# Patient Record
Sex: Female | Born: 1965
Health system: Southern US, Community
[De-identification: ages and names within clinical notes are randomized; demographics above are authoritative.]

## PROBLEM LIST (undated history)

## (undated) DIAGNOSIS — G43909 Migraine, unspecified, not intractable, without status migrainosus: Secondary | ICD-10-CM

## (undated) DIAGNOSIS — G4700 Insomnia, unspecified: Secondary | ICD-10-CM

## (undated) DIAGNOSIS — M1990 Unspecified osteoarthritis, unspecified site: Secondary | ICD-10-CM

## (undated) DIAGNOSIS — E039 Hypothyroidism, unspecified: Secondary | ICD-10-CM

## (undated) DIAGNOSIS — F329 Major depressive disorder, single episode, unspecified: Secondary | ICD-10-CM

## (undated) DIAGNOSIS — K602 Anal fissure, unspecified: Secondary | ICD-10-CM

## (undated) DIAGNOSIS — F419 Anxiety disorder, unspecified: Secondary | ICD-10-CM

## (undated) DIAGNOSIS — E079 Disorder of thyroid, unspecified: Secondary | ICD-10-CM

## (undated) DIAGNOSIS — F32A Depression, unspecified: Secondary | ICD-10-CM

## (undated) DIAGNOSIS — M069 Rheumatoid arthritis, unspecified: Secondary | ICD-10-CM

## (undated) HISTORY — PX: OTHER SURGICAL HISTORY: SHX169

## (undated) HISTORY — DX: Hypothyroidism, unspecified: E03.9

## (undated) HISTORY — DX: Insomnia, unspecified: G47.00

## (undated) HISTORY — DX: Unspecified osteoarthritis, unspecified site: M19.90

## (undated) HISTORY — DX: Rheumatoid arthritis, unspecified: M06.9

## (undated) HISTORY — DX: Anxiety disorder, unspecified: F41.9

## (undated) HISTORY — PX: KNEE ARTHROPLASTY: SHX992

## (undated) HISTORY — DX: Anal fissure, unspecified: K60.2

## (undated) HISTORY — PX: HEMORRHOID SURGERY: SHX153

## (undated) HISTORY — PX: ABDOMINAL HYSTERECTOMY: SHX81

## (undated) HISTORY — PX: MOUTH SURGERY: SHX715

## (undated) HISTORY — PX: KNEE SURGERY: SHX244

---

## 2008-12-03 ENCOUNTER — Encounter: Admission: RE | Admit: 2008-12-03 | Discharge: 2008-12-03 | Payer: Self-pay | Admitting: Internal Medicine

## 2010-11-24 ENCOUNTER — Encounter: Payer: Self-pay | Admitting: Family Medicine

## 2010-11-24 ENCOUNTER — Inpatient Hospital Stay (INDEPENDENT_AMBULATORY_CARE_PROVIDER_SITE_OTHER)
Admission: RE | Admit: 2010-11-24 | Discharge: 2010-11-24 | Disposition: A | Discharge status: Home / Self Care | Payer: 59 | Source: Ambulatory Visit | Attending: Family Medicine | Admitting: Family Medicine

## 2010-11-24 DIAGNOSIS — M940 Chondrocostal junction syndrome [Tietze]: Secondary | ICD-10-CM

## 2010-11-24 DIAGNOSIS — J069 Acute upper respiratory infection, unspecified: Secondary | ICD-10-CM

## 2011-01-08 ENCOUNTER — Inpatient Hospital Stay (INDEPENDENT_AMBULATORY_CARE_PROVIDER_SITE_OTHER)
Admission: RE | Admit: 2011-01-08 | Discharge: 2011-01-08 | Disposition: A | Discharge status: Home / Self Care | Payer: 59 | Source: Ambulatory Visit | Attending: Emergency Medicine | Admitting: Emergency Medicine

## 2011-01-08 ENCOUNTER — Encounter: Payer: Self-pay | Admitting: Emergency Medicine

## 2011-01-08 DIAGNOSIS — F419 Anxiety disorder, unspecified: Secondary | ICD-10-CM | POA: Insufficient documentation

## 2011-01-08 DIAGNOSIS — M549 Dorsalgia, unspecified: Secondary | ICD-10-CM

## 2011-06-29 ENCOUNTER — Emergency Department
Admission: EM | Admit: 2011-06-29 | Discharge: 2011-06-29 | Disposition: A | Discharge status: Home / Self Care | Payer: 59 | Source: Home / Self Care | Attending: Emergency Medicine | Admitting: Emergency Medicine

## 2011-06-29 ENCOUNTER — Encounter: Payer: Self-pay | Admitting: *Deleted

## 2011-06-29 DIAGNOSIS — R05 Cough: Secondary | ICD-10-CM

## 2011-06-29 DIAGNOSIS — J069 Acute upper respiratory infection, unspecified: Secondary | ICD-10-CM

## 2011-06-29 MED ORDER — GUAIFENESIN-CODEINE 100-10 MG/5ML PO SYRP: 5.0000 mL | ORAL_SOLUTION | Freq: Four times a day (QID) | ORAL | Status: AC | PRN

## 2011-06-29 MED ORDER — AMOXICILLIN 875 MG PO TABS: 875.0000 mg | ORAL_TABLET | Freq: Two times a day (BID) | ORAL | Status: AC

## 2011-06-29 MED ORDER — PREDNISONE (PAK) 10 MG PO TABS: 10.0000 mg | ORAL_TABLET | Freq: Every day | ORAL | Status: AC

## 2011-06-29 NOTE — ED Provider Notes (Signed)
History     CSN: [id number] Arrival date & time: 06/29/2011  9:14 AM   First MD Initiated Contact with Patient 06/29/11 [phone]      Chief Complaint  Patient presents with  . Cough    (Consider location/radiation/quality/duration/timing/severity/associated sxs/prior treatment) HPI Courtney Leonard is a 45 y.o. female who complains of onset of cold symptoms for 10  days. 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 No sore throat + cough No pleuritic pain No wheezing + nasal congestion + post-nasal drainage + sinus pain/pressure No chest congestion No itchy/red eyes + earache No hemoptysis No SOB No chills/sweats No fever No nausea No vomiting No abdominal pain No diarrhea No skin rashes + fatigue No myalgias No headache    History reviewed. No pertinent past medical history.  Past Surgical History  Procedure Date  . Appendectomy     c-section  . Abdominal hysterectomy   . Laproscopy   . Knee surgery     RT knee    Family History  Problem Relation Age of Onset  . Diabetes Mother   . Hypertension Mother   . Atrial fibrillation Father   . Prostate cancer Father     History  Substance Use Topics  . Smoking status: Former Smoker    Quit date: 06/28/2001  . Smokeless tobacco: Not on file  . Alcohol Use: Yes     1 q wk    OB History    Grav Para Term Preterm Abortions TAB SAB Ect Mult Living                  Review of Systems  Allergies  Review of patient's allergies indicates no known allergies.  Home Medications  No current outpatient prescriptions on file.  BP 107/73  Pulse 68  Temp(Src) 98.1 F (36.7 C) (Oral)  Resp 16  Ht 5\' 4"  (1.626 m)  Wt 176 lb (79.833 kg)  BMI 30.21 kg/m2  SpO2 100%  Physical Exam  Nursing note and vitals reviewed. Constitutional: She is oriented to person, place, and time. She appears well-developed and well-nourished.  HENT:  Head: Normocephalic and atraumatic.  Right Ear: Tympanic membrane, external  ear and ear canal normal.  Left Ear: Tympanic membrane, external ear and ear canal normal.  Nose: Mucosal edema and rhinorrhea present.  Mouth/Throat: Posterior oropharyngeal erythema present. No oropharyngeal exudate or posterior oropharyngeal edema.  Neck: Neck supple.  Cardiovascular: Regular rhythm and normal heart sounds.   Pulmonary/Chest: Effort normal and breath sounds normal. No respiratory distress.  Neurological: She is alert and oriented to person, place, and time.  Skin: Skin is warm and dry.  Psychiatric: She has a normal mood and affect. Her speech is normal.    ED Course  Procedures (including critical care time)  Labs Reviewed - No data to display No results found.   No diagnosis found.    MDM   1)  Take the prescribed antibiotic as instructed.  Hold for a few days since this is likely viral. I first put her on cough medicine and prednisone for the next few days because I think that will work best. 2)  Use nasal saline solution (over the counter) at least 3 times a day. 3)  Use over the counter decongestants like Zyrtec-D every 12 hours as needed to help with congestion.  If you have hypertension, do not take medicines with sudafed.  4)  Can take tylenol every 6 hours or motrin every 8 hours for pain or  fever. 5)  Follow up with your primary doctor if no improvement in 5-7 days, sooner if increasing pain, fever, or new symptoms.       Lily Kocher, MD 06/29/11 [phone]

## 2011-06-29 NOTE — ED Notes (Signed)
Pt c/o dry cough, runny and stuffy nose, and hoarseness x 1 1/2 wk. No fever. She has taken dayquil, nyquil, and robt. with no relief.

## 2011-07-06 NOTE — Progress Notes (Signed)
Summary: UPPER BACK PAIN RIGHT SIDE   Vital Signs:  Patient Profile:   45 Years Old Female CC:      right scapula pain x yesterday Height:     64 inches Weight:      172 pounds O2 Sat:      99 % O2 treatment:    Room Air Temp:     97.9 degrees F oral Pulse rate:   68 / minute Resp:     16 per minute BP sitting:   103 / 68  (left arm) Cuff size:   regular  Pt. in pain?   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 Location: upper R back Timing: for 1 day Description: sharp cramping Modifying factors: ibu & tylenol didn't help much, heating pad helped a little Worse with: movement, lifting Better with: rest, massage Trauma: no Bladder/bowel incontinence: no Weakness: no Fever/chills: no Night pain: no Unexplained weight loss: no Cancer/immunosuppression: no PMH of osteoporosis or chronic steroid use:  no  REVIEW OF SYSTEMS Constitutional Symptoms      Denies fever, chills, night sweats, weight loss, weight gain, and fatigue.  Eyes       Denies change in vision, eye pain, eye discharge, glasses, contact lenses, and eye surgery. Ear/Nose/Throat/Mouth       Denies hearing loss/aids, change in hearing, ear pain, ear discharge, dizziness, frequent runny nose, frequent nose bleeds, sinus problems, sore throat, hoarseness, and tooth pain or bleeding.  Respiratory       Denies dry cough, productive cough, wheezing, shortness of breath, asthma, bronchitis, and emphysema/COPD.  Cardiovascular       Denies murmurs, chest pain, and tires easily with exhertion.     Gastrointestinal       Denies stomach pain, nausea/vomiting, diarrhea, constipation, blood in bowel movements, and indigestion. Genitourniary       Denies painful urination, kidney stones, and loss of urinary control. Neurological       Denies paralysis, seizures, and fainting/blackouts. Musculoskeletal       Complains of muscle pain and decreased range of motion.      Denies joint pain, joint stiffness, redness, swelling, muscle weakness, and gout.      Comments: right scapula Skin       Denies bruising, unusual mles/lumps or sores, and hair/skin or nail changes.  Psych       Denies mood changes, temper/anger issues, anxiety/stress, speech problems, depression, and sleep problems. Other Comments: Patient c/o right scapula pain x yesterday. No known cause of pain. Used heating pad, advil and ibuprofen for pain relief.    Past History:  Past Medical History:  Anxiety  Past Surgical History: Reviewed history from 11/24/2010 and no changes required. Caesarean section Hysterectomy  Family History: Reviewed history from 11/24/2010 and no changes required. 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  Shoulder FROM. Assessment New Problems: ANXIETY (ICD-300.00) BACK PAIN (ICD-724.5)   Plan New Medications/Changes: TRAMADOL HCL 50 MG TABS (TRAMADOL HCL) 1 by mouth q6-8 hrs as needed for pain  #16 x 0, 01/08/2011, Hoyt Koch MD PREDNISONE 20 MG TABS (PREDNISONE) 1 by mouth two times a day for 3  days  #6 x 0, 01/08/2011, Hoyt Koch MD FLEXERIL 10 MG TABS (CYCLOBENZAPRINE HCL) 1 by mouth three times a day as needed for spasms  #16 x 0, 01/08/2011, Hoyt Koch MD  New Orders: Est. Patient Level III [phone] Planning Comments:   Back spasm and pain, likely from working at home and work.  Avoid heavy lifting, pushing, pulling.  Massage, rest, and heating pad.  Use meds as directed.  If not improving by next week, can refer to PT +/- sports med.  F/u with PCP if worsening in the meantime.   The patient and/or caregiver has been counseled thoroughly with regard to medications prescribed including dosage, schedule, interactions, rationale for use, and possible side effects and they verbalize understanding.  Diagnoses and expected course of recovery discussed and will return if not improved as expected or if the condition worsens. Patient and/or caregiver verbalized understanding.  Prescriptions: TRAMADOL HCL 50 MG TABS (TRAMADOL HCL) 1 by mouth q6-8 hrs as needed for pain  #16 x 0   Entered and Authorized by:   Hoyt Koch MD   Signed by:   Hoyt Koch MD on 01/08/2011   Method used:   Print then Give to Patient   RxID:   [id number] PREDNISONE 20 MG TABS (PREDNISONE) 1 by mouth two times a day for 3 days  #6 x 0   Entered and Authorized by:   Hoyt Koch MD   Signed by:   Hoyt Koch MD on 01/08/2011   Method used:   Print then Give to Patient   RxID:   [id number] FLEXERIL 10 MG TABS (CYCLOBENZAPRINE HCL) 1 by mouth three times a day as needed for spasms  #16 x 0   Entered and Authorized by:   Hoyt Koch MD   Signed by:   Hoyt Koch MD on 01/08/2011   Method used:   Print then Give to Patient   RxID:   [id number]   Orders Added: 1)  Est. Patient Level III [47425]

## 2011-07-06 NOTE — Letter (Signed)
Summary: Out of Work  [facility]  [address]   [place], [place] [address]   Phone: [phone]  Fax: [phone]    November 24, 2010   Employee:  Courtney Leonard    To Whom It May Concern:   For Medical reasons, please excuse the above named employee from work 11/26/10.  If you need additional information, please feel free to contact our office.         Sincerely,    Donna Christen MD

## 2011-07-06 NOTE — Progress Notes (Signed)
Summary: Possible Sinus Infection and ear discomfort? Room 5   Vital Signs:  Patient Profile:   45 Years Old Female CC:      Headache, ears hurt, cough x 1 week Height:     64 inches Weight:      176 pounds O2 Sat:      99 % O2 treatment:    Room Air Temp:     98.5 degrees F oral Pulse rate:   84 / minute Pulse rhythm:   regular Resp:     16 per minute BP sitting:   115 / 77  (left arm) Cuff size:   regular  Vitals Entered By: Emilio Math (November 24, 2010 12:10 PM)                History of Present Illness Chief Complaint: Headache, ears hurt, cough x 1 week History of Present Illness:  Subjective: Patient complains onset of frontal headache about 1.5 weeks ago.  She has a history of seasonal allergies that usually peak earlier in the year.  About 2 to 3 days ago she developed a mild sore throat and stiff posterior neck. + cough, worse at night No pleuritic pain, but has pain over the sternum No wheezing + nasal congestion + post-nasal drainage ? sinus pain/pressure No itchy/red eyes + bilateral earache No hemoptysis No SOB + low grade fever/chills for about 2 days No nausea No vomiting No abdominal pain No diarrhea No skin rashes + fatigue + myalgias Used OTC meds without relief   Current Meds NYQUIL COLD & FLU 15-6.25-325 MG/15ML LIQD (DM-DOXYLAMINE-ACETAMINOPHEN)  AZITHROMYCIN 250 MG TABS (AZITHROMYCIN) Two tabs by mouth on day 1, then 1 tab daily on days 2 through 5 (Rx void after 12/02/10) BENZONATATE 200 MG CAPS (BENZONATATE) One by mouth hs as needed cough  REVIEW OF SYSTEMS Constitutional Symptoms      Denies fever, chills, night sweats, weight loss, weight gain, and fatigue.  Eyes       Denies change in vision, eye pain, eye discharge, glasses, contact lenses, and eye surgery. Ear/Nose/Throat/Mouth       Complains of ear pain and sinus problems.      Denies hearing loss/aids, change in hearing, ear discharge, dizziness, frequent runny nose,  frequent nose bleeds, sore throat, hoarseness, and tooth pain or bleeding.  Respiratory       Complains of dry cough.      Denies productive cough, wheezing, shortness of breath, asthma, bronchitis, and emphysema/COPD.  Cardiovascular       Denies murmurs, chest pain, and tires easily with exhertion.    Gastrointestinal       Denies stomach pain, nausea/vomiting, diarrhea, constipation, blood in bowel movements, and indigestion. Genitourniary       Denies painful urination, kidney stones, and loss of urinary control. Neurological       Complains of headaches.      Denies paralysis, seizures, and fainting/blackouts. Musculoskeletal       Denies muscle pain, joint pain, joint stiffness, decreased range of motion, redness, swelling, muscle weakness, and gout.  Skin       Denies bruising, unusual mles/lumps or sores, and hair/skin or nail changes.  Psych       Denies mood changes, temper/anger issues, anxiety/stress, speech problems, depression, and sleep problems.  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  Extraocular movement is intact.  Conjunctivae are not inflamed.  Ears:  Canals normal.  Tympanic membranes normal.   Nose:  Mildly congested turbinates.  No sinus tenderness  Pharynx:  Normal Neck:  Supple.  Tender shotty anterior/posterior nodes are palpated bilaterally.  Lungs:  Clear to auscultation.  Breath sounds are equal.  Chest:  Distinct tenderness to palpation over mid-sternum Heart:  Regular rate and rhythm without murmurs, rubs, or gallops.  Abdomen:  Nontender without masses or hepatosplenomegaly.  Bowel  sounds are present.  No CVA or flank tenderness.  Skin:  No rash Rapid strep test negative  Assessment New Problems: COSTOCHONDRITIS, ACUTE (ICD-733.6) UPPER RESPIRATORY INFECTION, ACUTE (ICD-465.9)   Plan New Medications/Changes: BENZONATATE 200 MG CAPS (BENZONATATE) One by mouth hs as needed cough  #12 x 0, 11/24/2010, Donna Christen MD AZITHROMYCIN 250 MG TABS (AZITHROMYCIN) Two tabs by mouth on day 1, then 1 tab daily on days 2 through 5 (Rx void after 12/02/10)  #6 tabs x 0, 11/24/2010, Donna Christen MD  New Orders: Rapid Strep [40981] Pulse Oximetry (single measurment) [19147] New Patient Level III [82956] Planning Comments:   Treat symptomatically for now:  Increase fluid intake, begin expectorant/decongestant, cough suppressant at bedtime.  If fever/chills/sweats persist, or if not improving 5 days begin Z-pack (given Rx to hold).  Take ibuprofen for sternum discomfort. Followup with PCP if not improving 7 to 10 days.   The patient and/or caregiver has been counseled thoroughly with regard to medications prescribed including dosage, schedule, interactions, rationale for use, and possible side effects and they verbalize understanding.  Diagnoses and expected course of recovery discussed and will return if not improved as expected or if the condition worsens. Patient and/or caregiver verbalized understanding.  Prescriptions: BENZONATATE 200 MG CAPS (BENZONATATE) One by mouth hs as needed cough  #12 x 0   Entered and Authorized by:   Donna Christen MD   Signed by:   Donna Christen MD on 11/24/2010   Method used:   Print then Give to Patient   RxID:   [id number] AZITHROMYCIN 250 MG TABS (AZITHROMYCIN) Two tabs by mouth on day 1, then 1 tab daily on days 2 through 5 (Rx void after 12/02/10)  #6 tabs x 0   Entered and Authorized by:   Donna Christen MD   Signed by:   Donna Christen MD on 11/24/2010   Method used:   Print then Give to Patient   RxID:   [id number]   Patient  Instructions: 1)  Take Mucinex D (guaifenesin with decongestant) twice daily for congestion. 2)  Increase fluid intake, rest. 3)  May use Afrin nasal spray (or generic oxymetazoline) twice daily for about 5 days.  Also recommend using saline nasal spray several times daily and/or saline nasal irrigation. 4)  Take Ibuprofen 200mg , 4 tabs every 8 hours with food for chest pain, sore throat, etc. 5)  Begin Azithromycin if not improving about 5 days or if persistent fever develops. 6)  Followup with family doctor if not improving 7 to 10 days.   Orders Added: 1)  Rapid Strep [66440] 2)  Pulse Oximetry (single measurment) [94760] 3)  New Patient Level III [34742]  Appended Document: Possible Sinus Infection and ear discomfort? Room 5 Rapid strep: Negative

## 2013-02-11 ENCOUNTER — Emergency Department
Admission: EM | Admit: 2013-02-11 | Discharge: 2013-02-11 | Disposition: A | Discharge status: Home / Self Care | Payer: 59 | Source: Home / Self Care | Attending: Family Medicine | Admitting: Family Medicine

## 2013-02-11 DIAGNOSIS — J029 Acute pharyngitis, unspecified: Secondary | ICD-10-CM

## 2013-02-11 DIAGNOSIS — J020 Streptococcal pharyngitis: Secondary | ICD-10-CM

## 2013-02-11 HISTORY — DX: Depression, unspecified: F32.A

## 2013-02-11 HISTORY — DX: Migraine, unspecified, not intractable, without status migrainosus: G43.909

## 2013-02-11 HISTORY — DX: Major depressive disorder, single episode, unspecified: F32.9

## 2013-02-11 LAB — POCT RAPID STREP A (OFFICE): Rapid Strep A Screen: POSITIVE — AB

## 2013-02-11 MED ORDER — PENICILLIN G BENZATHINE 1200000 UNIT/2ML IM SUSP
1.2000 10*6.[IU] | Freq: Once | INTRAMUSCULAR | Status: AC
  Administered 2013-02-11: 1.2 10*6.[IU] via INTRAMUSCULAR

## 2013-02-11 NOTE — ED Notes (Signed)
States daughter tested positive for strep x 2 days ago. Noted last night with ear pain and sore throat

## 2013-02-11 NOTE — ED Provider Notes (Signed)
   History    CSN: [id number] Arrival date & time 02/11/13  1053  First MD Initiated Contact with Patient 02/11/13 1054     Chief Complaint  Patient presents with  . Sore Throat   HPI  SORE THROAT  Onset: 2 days  Description: sore throat, generalized malaise  Modifying factors: + recent strep exposure   Symptoms  Fever:  Subjective  URI symptoms: no Cough: no Headache: no Rash:  no Swollen glands:   yes Recent Strep Exposure: yes LUQ pain: no Heartburn/brash: no Allergy Symptoms: no  Red Flags STD exposure: no Breathing difficulty: no Drooling: no Trismus: no   No past medical history on file. Past Surgical History  Procedure Laterality Date  . Appendectomy      c-section  . Abdominal hysterectomy    . Laproscopy    . Knee surgery      RT knee   Family History  Problem Relation Age of Onset  . Diabetes Mother   . Hypertension Mother   . Atrial fibrillation Father   . Prostate cancer Father    History  Substance Use Topics  . Smoking status: Former Smoker    Quit date: 06/28/2001  . Smokeless tobacco: Not on file  . Alcohol Use: Yes     Comment: 1 q wk   OB History   Grav Para Term Preterm Abortions TAB SAB Ect Mult Living                 Review of Systems  All other systems reviewed and are negative.    Allergies  Review of patient's allergies indicates no known allergies.  Home Medications  No current outpatient prescriptions on file. BP 102/63  Pulse 93  Temp(Src) 98.3 F (36.8 C) (Oral)  Ht 5\' 4"  (1.626 m)  Wt 154 lb 8 oz (70.081 kg)  BMI 26.51 kg/m2  SpO2 100% Physical Exam  Constitutional: She appears well-developed and well-nourished.  HENT:  Head: Normocephalic.  Right Ear: External ear normal.  Left Ear: External ear normal.  Mouth/Throat: Oropharyngeal exudate present.  Eyes: Conjunctivae are normal. Pupils are equal, round, and reactive to light.  Neck: Normal range of motion. Neck supple.  Cardiovascular: Normal  rate, regular rhythm and normal heart sounds.   Pulmonary/Chest: Effort normal.  Abdominal: Soft.  Lymphadenopathy:    She has cervical adenopathy.  Skin: Skin is warm.    ED Course  Procedures (including critical care time) Labs Reviewed - No data to display No results found. 1. Strep pharyngitis     MDM  Rapid strep positive Will treat with bicillin Discussed supportive care and infectious/ENT red flags.  Follow up as needed.     The patient and/or caregiver has been counseled thoroughly with regard to treatment plan and/or medications prescribed including dosage, schedule, interactions, rationale for use, and possible side effects and they verbalize understanding. Diagnoses and expected course of recovery discussed and will return if not improved as expected or if the condition worsens. Patient and/or caregiver verbalized understanding.       Doree Albee, MD 02/11/13 1120

## 2014-08-13 ENCOUNTER — Emergency Department (HOSPITAL_BASED_OUTPATIENT_CLINIC_OR_DEPARTMENT_OTHER)
Admission: EM | Admit: 2014-08-13 | Discharge: 2014-08-14 | Disposition: A | Discharge status: Home / Self Care | Payer: 59 | Attending: Emergency Medicine | Admitting: Emergency Medicine

## 2014-08-13 ENCOUNTER — Encounter (HOSPITAL_BASED_OUTPATIENT_CLINIC_OR_DEPARTMENT_OTHER): Payer: Self-pay | Admitting: *Deleted

## 2014-08-13 ENCOUNTER — Emergency Department (HOSPITAL_BASED_OUTPATIENT_CLINIC_OR_DEPARTMENT_OTHER): Payer: 59

## 2014-08-13 DIAGNOSIS — Z87891 Personal history of nicotine dependence: Secondary | ICD-10-CM | POA: Insufficient documentation

## 2014-08-13 DIAGNOSIS — Z79899 Other long term (current) drug therapy: Secondary | ICD-10-CM | POA: Diagnosis not present

## 2014-08-13 DIAGNOSIS — R52 Pain, unspecified: Secondary | ICD-10-CM

## 2014-08-13 DIAGNOSIS — G43909 Migraine, unspecified, not intractable, without status migrainosus: Secondary | ICD-10-CM | POA: Insufficient documentation

## 2014-08-13 DIAGNOSIS — E079 Disorder of thyroid, unspecified: Secondary | ICD-10-CM | POA: Insufficient documentation

## 2014-08-13 DIAGNOSIS — F329 Major depressive disorder, single episode, unspecified: Secondary | ICD-10-CM | POA: Diagnosis not present

## 2014-08-13 DIAGNOSIS — R1909 Other intra-abdominal and pelvic swelling, mass and lump: Secondary | ICD-10-CM | POA: Diagnosis not present

## 2014-08-13 DIAGNOSIS — R1032 Left lower quadrant pain: Secondary | ICD-10-CM | POA: Diagnosis present

## 2014-08-13 DIAGNOSIS — N838 Other noninflammatory disorders of ovary, fallopian tube and broad ligament: Secondary | ICD-10-CM

## 2014-08-13 HISTORY — DX: Disorder of thyroid, unspecified: E07.9

## 2014-08-13 LAB — URINALYSIS, ROUTINE W REFLEX MICROSCOPIC
Bilirubin Urine: NEGATIVE
GLUCOSE, UA: NEGATIVE mg/dL
Hgb urine dipstick: NEGATIVE
Ketones, ur: NEGATIVE mg/dL
LEUKOCYTES UA: NEGATIVE
Nitrite: NEGATIVE
PH: 6 (ref 5.0–8.0)
PROTEIN: NEGATIVE mg/dL
SPECIFIC GRAVITY, URINE: 1.024 (ref 1.005–1.030)
Urobilinogen, UA: 1 mg/dL (ref 0.0–1.0)

## 2014-08-13 MED ORDER — HYDROCODONE-ACETAMINOPHEN 5-325 MG PO TABS
1.0000 | ORAL_TABLET | Freq: Once | ORAL | Status: AC
  Administered 2014-08-13: 1 via ORAL
  Filled 2014-08-13: qty 1

## 2014-08-13 NOTE — ED Provider Notes (Signed)
CSN: [id number]     Arrival date & time 08/13/14  2113 History  This chart was scribed for Shanon Rosser, MD by Hilda Lias, ED Scribe. This patient was seen in room MH11/MH11 and the patient's care was started at 11:26 PM.    Chief Complaint  Patient presents with  . Abdominal Pain     The history is provided by the patient. No language interpreter was used.     HPI Comments: Courtney Leonard is a 49 y.o. female who is status post hysterectomy but still has her ovaries. She presents to the Emergency Department complaining of constant LLQ abdominal pain that has been present for about a week. Pt states that she blew her nose yesterday afternoon and felt like something in her lower abdomen "burst". Pt notes that the area has been painful since this incident and states that it feels warm. Pain is moderate, worse with movement or palpation. Pt denies dysuria, fever and SOB. She has had left paralumbar pain for about the last 3 weeks.    Past Medical History  Diagnosis Date  . Depression   . Migraine   . Thyroid disease    Past Surgical History  Procedure Laterality Date  . Appendectomy      c-section  . Abdominal hysterectomy    . Laproscopy    . Knee surgery      RT knee  . Cesarean section     Family History  Problem Relation Age of Onset  . Diabetes Mother   . Hypertension Mother   . Atrial fibrillation Father   . Prostate cancer Father    History  Substance Use Topics  . Smoking status: Former Smoker    Quit date: 06/28/2001  . Smokeless tobacco: Not on file  . Alcohol Use: Yes     Comment: 1 q wk   OB History    No data available     Review of Systems  Constitutional: Negative for fever.  Respiratory: Negative for shortness of breath.   Gastrointestinal: Positive for abdominal pain.  Genitourinary: Negative for dysuria.  All other systems reviewed and are negative.     Allergies  Review of patient's allergies indicates no known allergies.  Home  Medications   Prior to Admission medications   Medication Sig Start Date End Date Taking? Authorizing Provider  Levothyroxine Sodium (SYNTHROID PO) Take by mouth.   Yes Historical Provider, MD  NAPROXEN PO Take by mouth.   Yes Historical Provider, MD  Sertraline HCl (ZOLOFT PO) Take by mouth.   Yes Historical Provider, MD  Topiramate (TOPAMAX PO) Take by mouth.   Yes Historical Provider, MD   BP 95/68 mmHg  Pulse 54  Temp(Src) 97.8 F (36.6 C) (Oral)  Resp 16  Ht 5\' 4"  (1.626 m)  Wt 152 lb (68.947 kg)  BMI 26.08 kg/m2  SpO2 100%   Physical Exam  General: Well-developed, well-nourished female in no acute distress; appearance consistent with age of record HENT: normocephalic; atraumatic Eyes: pupils equal, round and reactive to light; extraocular muscles intact Neck: supple Heart: regular rate and rhythm; no murmurs, rubs or gallops Lungs: clear to auscultation bilaterally Abdomen: soft; nondistended; no masses or hepatosplenomegaly; bowel sounds present; left suprapubic tenderness Extremities: No deformity; full range of motion; pulses normal Neurologic: Awake, alert and oriented; motor function intact in all extremities and symmetric; no facial droop Skin: Warm and dry Psychiatric: Normal mood and affect Back: left para lumbar soft tissue tenderness with palpable muscle spasm  ED  Course  Procedures (including critical care time)  DIAGNOSTIC STUDIES: Oxygen Saturation is 100% on RA, normal by my interpretation.    COORDINATION OF CARE: 11:30 PM Discussed treatment plan with pt at bedside and pt agreed to plan.    MDM   Nursing notes and vitals signs, including pulse oximetry, reviewed.  Summary of this visit's results, reviewed by myself:  Labs:  Results for orders placed or performed during the hospital encounter of 08/13/14 (from the past 24 hour(s))  Urinalysis, Routine w reflex microscopic     Status: Abnormal   Collection Time: 08/13/14  9:25 PM  Result Value  Ref Range   Color, Urine YELLOW YELLOW   APPearance CLOUDY (A) CLEAR   Specific Gravity, Urine 1.024 1.005 - 1.030   pH 6.0 5.0 - 8.0   Glucose, UA NEGATIVE NEGATIVE mg/dL   Hgb urine dipstick NEGATIVE NEGATIVE   Bilirubin Urine NEGATIVE NEGATIVE   Ketones, ur NEGATIVE NEGATIVE mg/dL   Protein, ur NEGATIVE NEGATIVE mg/dL   Urobilinogen, UA 1.0 0.0 - 1.0 mg/dL   Nitrite NEGATIVE NEGATIVE   Leukocytes, UA NEGATIVE NEGATIVE    Imaging Studies: US Transvaginal Non-ob  09-Sep-2014   CLINICAL DATA:  Acute onset of left-sided abdominal pain and left flank pain. Initial encounter.  EXAM: TRANSABDOMINAL AND TRANSVAGINAL ULTRASOUND OF PELVIS  TECHNIQUE: Both transabdominal and transvaginal ultrasound examinations of the pelvis were performed. Transabdominal technique was performed for global imaging of the pelvis including left ovary, adnexal regions, and pelvic cul-de-sac. It was necessary to proceed with endovaginal exam following the transabdominal exam to visualize the left ovary in greater detail.  COMPARISON:  None  FINDINGS: Uterus  Status post hysterectomy.  Right ovary  Status post right-sided oophorectomy.  Left ovary  Measurements: 7.2 x 5.8 x 6.7 cm. There is a large hypoechoic cystic mass at the left ovary, measuring 5.9 x 5.5 x 5.1 cm. It contains a solid mural nodule, measuring 2.2 x 1.4 x 1.4 cm. No definite associated blood flow is seen, though evaluation for blood flow is limited due to surrounding bowel.  Other findings  No free fluid.  IMPRESSION: 1. Large hypoechoic cystic mass at the left ovary, measuring 5.9 x 5.5 x 5.1 cm, containing a solid 2.2 cm mural nodule. Contrast enhanced MRI would be helpful for further evaluation, to help exclude malignancy. 2. Status post hysterectomy and right-sided oophorectomy.   Electronically Signed   By: Garald Balding M.D.   On: 2014-09-09 01:06   US Pelvis Complete  September 09, 2014   CLINICAL DATA:  Acute onset of left-sided abdominal pain and left  flank pain. Initial encounter.  EXAM: TRANSABDOMINAL AND TRANSVAGINAL ULTRASOUND OF PELVIS  TECHNIQUE: Both transabdominal and transvaginal ultrasound examinations of the pelvis were performed. Transabdominal technique was performed for global imaging of the pelvis including left ovary, adnexal regions, and pelvic cul-de-sac. It was necessary to proceed with endovaginal exam following the transabdominal exam to visualize the left ovary in greater detail.  COMPARISON:  None  FINDINGS: Uterus  Status post hysterectomy.  Right ovary  Status post right-sided oophorectomy.  Left ovary  Measurements: 7.2 x 5.8 x 6.7 cm. There is a large hypoechoic cystic mass at the left ovary, measuring 5.9 x 5.5 x 5.1 cm. It contains a solid mural nodule, measuring 2.2 x 1.4 x 1.4 cm. No definite associated blood flow is seen, though evaluation for blood flow is limited due to surrounding bowel.  Other findings  No free fluid.  IMPRESSION: 1. Large hypoechoic  cystic mass at the left ovary, measuring 5.9 x 5.5 x 5.1 cm, containing a solid 2.2 cm mural nodule. Contrast enhanced MRI would be helpful for further evaluation, to help exclude malignancy. 2. Status post hysterectomy and right-sided oophorectomy.   Electronically Signed   By: Garald Balding M.D.   On: 08/14/2014 01:06   1:15 AM Patient advised of ultrasound findings. She was advised that malignancy cannot be excluded at this time. She will follow-up with her OB/GYN Dr. Jeralene Huff.  I personally performed the services described in this documentation, which was scribed in my presence. The recorded information has been reviewed and is accurate.    Wynetta Fines, MD 08/14/14 [phone]

## 2014-08-13 NOTE — ED Notes (Signed)
MD at bedside. 

## 2014-08-13 NOTE — ED Notes (Signed)
Describes: "L side abd pain and L flank pain, was blowing nose yesterday afternoon and felt a 'pop' and warmness to L abd, pain worse with certain positions, otherwise remains constant and unchanging".  (denies: nvd, fever, bleeding, dizziness, sob, urinary or vaginal sx),  no relief with naproxen.  No meds PTA. Here from work.

## 2014-08-14 ENCOUNTER — Other Ambulatory Visit (HOSPITAL_COMMUNITY): Payer: Self-pay | Admitting: Nurse Practitioner

## 2014-08-14 DIAGNOSIS — N83202 Unspecified ovarian cyst, left side: Secondary | ICD-10-CM

## 2014-08-14 MED ORDER — HYDROCODONE-ACETAMINOPHEN 5-325 MG PO TABS: 1.0000 | ORAL_TABLET | Freq: Four times a day (QID) | ORAL | Status: DC | PRN

## 2014-08-14 NOTE — ED Notes (Signed)
Patient transported to Ultrasound 

## 2014-08-14 NOTE — ED Notes (Signed)
Called radiology to have [place] put on disc for pt.

## 2014-08-16 ENCOUNTER — Ambulatory Visit (HOSPITAL_COMMUNITY)
Admission: RE | Admit: 2014-08-16 | Discharge: 2014-08-16 | Disposition: A | Discharge status: Home / Self Care | Payer: 59 | Source: Ambulatory Visit | Attending: Nurse Practitioner | Admitting: Nurse Practitioner

## 2014-08-16 DIAGNOSIS — N8320 Unspecified ovarian cysts: Secondary | ICD-10-CM | POA: Diagnosis present

## 2014-08-16 DIAGNOSIS — N83202 Unspecified ovarian cyst, left side: Secondary | ICD-10-CM

## 2014-08-16 MED ORDER — GADOBENATE DIMEGLUMINE 529 MG/ML IV SOLN
15.0000 mL | Freq: Once | INTRAVENOUS | Status: AC | PRN
  Administered 2014-08-16: 15 mL via INTRAVENOUS

## 2014-09-10 ENCOUNTER — Other Ambulatory Visit: Payer: Self-pay | Admitting: Obstetrics & Gynecology

## 2014-09-11 NOTE — Anesthesia Preprocedure Evaluation (Signed)
Anesthesia Evaluation  Patient identified by MRN, date of birth, ID band Patient awake    Reviewed: Allergy & Precautions, NPO status , Patient's Chart, lab work & pertinent test results  Airway Mallampati: II  TM Distance: >3 FB Neck ROM: Full    Dental no notable dental hx. (+) Teeth Intact   Pulmonary former smoker,  breath sounds clear to auscultation  Pulmonary exam normal       Cardiovascular negative cardio ROS  Rhythm:Regular Rate:Normal     Neuro/Psych  Headaches, PSYCHIATRIC DISORDERS Anxiety Depression    GI/Hepatic negative GI ROS, Neg liver ROS,   Endo/Other  negative endocrine ROS  Renal/GU negative Renal ROS  negative genitourinary   Musculoskeletal negative musculoskeletal ROS (+)   Abdominal   Peds  Hematology negative hematology ROS (+)   Anesthesia Other Findings   Reproductive/Obstetrics Complex left ovarian cyst                             Anesthesia Physical Anesthesia Plan  ASA: II  Anesthesia Plan: General   Post-op Pain Management:    Induction: Intravenous  Airway Management Planned: Oral ETT  Additional Equipment:   Intra-op Plan:   Post-operative Plan: Extubation in OR  Informed Consent: I have reviewed the patients History and Physical, chart, labs and discussed the procedure including the risks, benefits and alternatives for the proposed anesthesia with the patient or authorized representative who has indicated his/her understanding and acceptance.   Dental advisory given  Plan Discussed with: Anesthesiologist, CRNA and Surgeon  Anesthesia Plan Comments:         Anesthesia Quick Evaluation

## 2014-09-12 ENCOUNTER — Ambulatory Visit (HOSPITAL_COMMUNITY): Payer: 59 | Admitting: Anesthesiology

## 2014-09-12 ENCOUNTER — Encounter (HOSPITAL_COMMUNITY)
Admission: RE | Disposition: A | Discharge status: Home / Self Care | Payer: Self-pay | Source: Ambulatory Visit | Attending: Obstetrics & Gynecology

## 2014-09-12 ENCOUNTER — Ambulatory Visit (HOSPITAL_COMMUNITY)
Admission: RE | Admit: 2014-09-12 | Discharge: 2014-09-12 | Disposition: A | Discharge status: Home / Self Care | Payer: 59 | Source: Ambulatory Visit | Attending: Obstetrics & Gynecology | Admitting: Obstetrics & Gynecology

## 2014-09-12 ENCOUNTER — Encounter (HOSPITAL_COMMUNITY): Payer: Self-pay | Admitting: Emergency Medicine

## 2014-09-12 DIAGNOSIS — Z90710 Acquired absence of both cervix and uterus: Secondary | ICD-10-CM | POA: Diagnosis not present

## 2014-09-12 DIAGNOSIS — F419 Anxiety disorder, unspecified: Secondary | ICD-10-CM | POA: Insufficient documentation

## 2014-09-12 DIAGNOSIS — Z9089 Acquired absence of other organs: Secondary | ICD-10-CM | POA: Insufficient documentation

## 2014-09-12 DIAGNOSIS — G43909 Migraine, unspecified, not intractable, without status migrainosus: Secondary | ICD-10-CM | POA: Insufficient documentation

## 2014-09-12 DIAGNOSIS — D271 Benign neoplasm of left ovary: Secondary | ICD-10-CM | POA: Diagnosis not present

## 2014-09-12 DIAGNOSIS — E079 Disorder of thyroid, unspecified: Secondary | ICD-10-CM | POA: Diagnosis not present

## 2014-09-12 DIAGNOSIS — Z79899 Other long term (current) drug therapy: Secondary | ICD-10-CM | POA: Insufficient documentation

## 2014-09-12 DIAGNOSIS — Z87891 Personal history of nicotine dependence: Secondary | ICD-10-CM | POA: Insufficient documentation

## 2014-09-12 DIAGNOSIS — N838 Other noninflammatory disorders of ovary, fallopian tube and broad ligament: Secondary | ICD-10-CM | POA: Diagnosis not present

## 2014-09-12 DIAGNOSIS — N736 Female pelvic peritoneal adhesions (postinfective): Secondary | ICD-10-CM | POA: Diagnosis not present

## 2014-09-12 DIAGNOSIS — F329 Major depressive disorder, single episode, unspecified: Secondary | ICD-10-CM | POA: Diagnosis not present

## 2014-09-12 DIAGNOSIS — Z791 Long term (current) use of non-steroidal anti-inflammatories (NSAID): Secondary | ICD-10-CM | POA: Diagnosis not present

## 2014-09-12 DIAGNOSIS — N8329 Other ovarian cysts: Secondary | ICD-10-CM | POA: Diagnosis present

## 2014-09-12 HISTORY — PX: ROBOTIC ASSISTED SALPINGO OOPHERECTOMY: SHX6082

## 2014-09-12 LAB — COMPREHENSIVE METABOLIC PANEL
ALBUMIN: 4.7 g/dL (ref 3.5–5.2)
ALT: 12 U/L (ref 0–35)
ANION GAP: 2 — AB (ref 5–15)
AST: 15 U/L (ref 0–37)
Alkaline Phosphatase: 60 U/L (ref 39–117)
BILIRUBIN TOTAL: 0.6 mg/dL (ref 0.3–1.2)
BUN: 16 mg/dL (ref 6–23)
CO2: 25 mmol/L (ref 19–32)
CREATININE: 0.82 mg/dL (ref 0.50–1.10)
Calcium: 8.8 mg/dL (ref 8.4–10.5)
Chloride: 110 mmol/L (ref 96–112)
GFR, EST NON AFRICAN AMERICAN: 84 mL/min — AB (ref >90)
Glucose, Bld: 94 mg/dL (ref 70–99)
Potassium: 3.8 mmol/L (ref 3.5–5.1)
Sodium: 137 mmol/L (ref 135–145)
Total Protein: 7.1 g/dL (ref 6.0–8.3)

## 2014-09-12 LAB — TYPE AND SCREEN
ABO/RH(D): A POS
Antibody Screen: NEGATIVE

## 2014-09-12 LAB — CBC
HCT: 38.5 % (ref 36.0–46.0)
HEMOGLOBIN: 13 g/dL (ref 12.0–15.0)
MCH: 32.7 pg (ref 26.0–34.0)
MCHC: 33.8 g/dL (ref 30.0–36.0)
MCV: 97 fL (ref 78.0–100.0)
Platelets: 173 10*3/uL (ref 150–400)
RBC: 3.97 MIL/uL (ref 3.87–5.11)
RDW: 12.6 % (ref 11.5–15.5)
WBC: 5.5 10*3/uL (ref 4.0–10.5)

## 2014-09-12 LAB — ABO/RH: ABO/RH(D): A POS

## 2014-09-12 SURGERY — ROBOTIC ASSISTED SALPINGO OOPHORECTOMY
Anesthesia: General | Site: Abdomen | Laterality: Left

## 2014-09-12 MED ORDER — LIDOCAINE HCL (CARDIAC) 20 MG/ML IV SOLN
INTRAVENOUS | Status: DC | PRN
  Administered 2014-09-12: 80 mg via INTRAVENOUS

## 2014-09-12 MED ORDER — FENTANYL CITRATE 0.05 MG/ML IJ SOLN
INTRAMUSCULAR | Status: AC
  Filled 2014-09-12: qty 5

## 2014-09-12 MED ORDER — MIDAZOLAM HCL 2 MG/2ML IJ SOLN
INTRAMUSCULAR | Status: DC | PRN
  Administered 2014-09-12: 2 mg via INTRAVENOUS

## 2014-09-12 MED ORDER — ROPIVACAINE HCL 5 MG/ML IJ SOLN
INTRAMUSCULAR | Status: AC
  Filled 2014-09-12: qty 30

## 2014-09-12 MED ORDER — HYDROMORPHONE HCL 1 MG/ML IJ SOLN
INTRAMUSCULAR | Status: AC
  Filled 2014-09-12: qty 1

## 2014-09-12 MED ORDER — ROPIVACAINE HCL 5 MG/ML IJ SOLN
INTRAMUSCULAR | Status: DC | PRN
  Administered 2014-09-12: 30 mL via EPIDURAL

## 2014-09-12 MED ORDER — LIDOCAINE HCL (CARDIAC) 20 MG/ML IV SOLN
INTRAVENOUS | Status: AC
  Filled 2014-09-12: qty 5

## 2014-09-12 MED ORDER — EPHEDRINE 5 MG/ML INJ
INTRAVENOUS | Status: AC
  Filled 2014-09-12: qty 10

## 2014-09-12 MED ORDER — FENTANYL CITRATE 0.05 MG/ML IJ SOLN
INTRAMUSCULAR | Status: DC | PRN
Start: 2014-09-12 — End: 2014-09-12
  Administered 2014-09-12 (×5): 50 ug via INTRAVENOUS

## 2014-09-12 MED ORDER — SODIUM CHLORIDE 0.9 % IJ SOLN
INTRAMUSCULAR | Status: DC | PRN
  Administered 2014-09-12: 60 mL via INTRAVENOUS

## 2014-09-12 MED ORDER — ONDANSETRON HCL 4 MG/2ML IJ SOLN
INTRAMUSCULAR | Status: AC
  Filled 2014-09-12: qty 2

## 2014-09-12 MED ORDER — ONDANSETRON HCL 4 MG/2ML IJ SOLN
INTRAMUSCULAR | Status: DC | PRN
  Administered 2014-09-12: 4 mg via INTRAVENOUS

## 2014-09-12 MED ORDER — LACTATED RINGERS IV SOLN
INTRAVENOUS | Status: DC
  Administered 2014-09-12 (×3): via INTRAVENOUS

## 2014-09-12 MED ORDER — SCOPOLAMINE 1 MG/3DAYS TD PT72
MEDICATED_PATCH | TRANSDERMAL | Status: AC
  Administered 2014-09-12: 1.5 mg via TRANSDERMAL
  Filled 2014-09-12: qty 1

## 2014-09-12 MED ORDER — CEFAZOLIN SODIUM-DEXTROSE 2-3 GM-% IV SOLR: 2.0000 g | INTRAVENOUS | Status: DC

## 2014-09-12 MED ORDER — SCOPOLAMINE 1 MG/3DAYS TD PT72
1.0000 | MEDICATED_PATCH | Freq: Once | TRANSDERMAL | Status: DC
Start: 2014-09-12 — End: 2014-09-12
  Administered 2014-09-12: 1.5 mg via TRANSDERMAL

## 2014-09-12 MED ORDER — BUPIVACAINE HCL (PF) 0.25 % IJ SOLN
INTRAMUSCULAR | Status: DC | PRN
  Administered 2014-09-12: 16 mL

## 2014-09-12 MED ORDER — KETOROLAC TROMETHAMINE 30 MG/ML IJ SOLN
INTRAMUSCULAR | Status: DC | PRN
  Administered 2014-09-12: 30 mg via INTRAVENOUS

## 2014-09-12 MED ORDER — FENTANYL CITRATE 0.05 MG/ML IJ SOLN: 25.0000 ug | INTRAMUSCULAR | Status: DC | PRN

## 2014-09-12 MED ORDER — KETOROLAC TROMETHAMINE 30 MG/ML IJ SOLN
INTRAMUSCULAR | Status: AC
  Filled 2014-09-12: qty 1

## 2014-09-12 MED ORDER — PROPOFOL 10 MG/ML IV BOLUS
INTRAVENOUS | Status: DC | PRN
  Administered 2014-09-12: 190 mg via INTRAVENOUS

## 2014-09-12 MED ORDER — OXYCODONE-ACETAMINOPHEN 5-325 MG PO TABS
ORAL_TABLET | ORAL | Status: AC
  Administered 2014-09-12: 1 via ORAL
  Filled 2014-09-12: qty 1

## 2014-09-12 MED ORDER — EPHEDRINE SULFATE 50 MG/ML IJ SOLN
INTRAMUSCULAR | Status: DC | PRN
  Administered 2014-09-12: 10 mg via INTRAVENOUS
  Administered 2014-09-12: 5 mg via INTRAVENOUS
  Administered 2014-09-12 (×2): 10 mg via INTRAVENOUS
  Administered 2014-09-12: 5 mg via INTRAVENOUS

## 2014-09-12 MED ORDER — SODIUM CHLORIDE 0.9 % IJ SOLN
INTRAMUSCULAR | Status: AC
  Filled 2014-09-12: qty 50

## 2014-09-12 MED ORDER — ROCURONIUM BROMIDE 100 MG/10ML IV SOLN
INTRAVENOUS | Status: DC | PRN
  Administered 2014-09-12: 40 mg via INTRAVENOUS
  Administered 2014-09-12: 10 mg via INTRAVENOUS

## 2014-09-12 MED ORDER — DEXAMETHASONE SODIUM PHOSPHATE 10 MG/ML IJ SOLN
INTRAMUSCULAR | Status: DC | PRN
  Administered 2014-09-12: 10 mg via INTRAVENOUS

## 2014-09-12 MED ORDER — ROCURONIUM BROMIDE 100 MG/10ML IV SOLN
INTRAVENOUS | Status: AC
  Filled 2014-09-12: qty 1

## 2014-09-12 MED ORDER — GLYCOPYRROLATE 0.2 MG/ML IJ SOLN
INTRAMUSCULAR | Status: DC | PRN
  Administered 2014-09-12: 0.6 mg via INTRAVENOUS

## 2014-09-12 MED ORDER — HYDROMORPHONE HCL 1 MG/ML IJ SOLN
INTRAMUSCULAR | Status: DC | PRN
  Administered 2014-09-12 (×2): 1 mg via INTRAVENOUS

## 2014-09-12 MED ORDER — MEPERIDINE HCL 25 MG/ML IJ SOLN: 6.2500 mg | INTRAMUSCULAR | Status: DC | PRN

## 2014-09-12 MED ORDER — SODIUM CHLORIDE 0.9 % IJ SOLN
INTRAMUSCULAR | Status: AC
  Filled 2014-09-12: qty 10

## 2014-09-12 MED ORDER — PROPOFOL 10 MG/ML IV BOLUS
INTRAVENOUS | Status: AC
  Filled 2014-09-12: qty 20

## 2014-09-12 MED ORDER — NEOSTIGMINE METHYLSULFATE 10 MG/10ML IV SOLN
INTRAVENOUS | Status: AC
  Filled 2014-09-12: qty 1

## 2014-09-12 MED ORDER — OXYCODONE-ACETAMINOPHEN 5-325 MG PO TABS
1.0000 | ORAL_TABLET | ORAL | Status: DC | PRN
  Administered 2014-09-12: 1 via ORAL

## 2014-09-12 MED ORDER — CEFAZOLIN SODIUM-DEXTROSE 2-3 GM-% IV SOLR
INTRAVENOUS | Status: AC
  Administered 2014-09-12: 2 g via INTRAVENOUS
  Filled 2014-09-12: qty 50

## 2014-09-12 MED ORDER — HEPARIN SODIUM (PORCINE) 5000 UNIT/ML IJ SOLN
INTRAMUSCULAR | Status: AC
  Filled 2014-09-12: qty 1

## 2014-09-12 MED ORDER — NEOSTIGMINE METHYLSULFATE 10 MG/10ML IV SOLN
INTRAVENOUS | Status: DC | PRN
  Administered 2014-09-12: 3 mg via INTRAVENOUS

## 2014-09-12 MED ORDER — DEXAMETHASONE SODIUM PHOSPHATE 10 MG/ML IJ SOLN
INTRAMUSCULAR | Status: AC
  Filled 2014-09-12: qty 1

## 2014-09-12 MED ORDER — GLYCOPYRROLATE 0.2 MG/ML IJ SOLN
INTRAMUSCULAR | Status: AC
  Filled 2014-09-12: qty 3

## 2014-09-12 MED ORDER — MIDAZOLAM HCL 2 MG/2ML IJ SOLN
INTRAMUSCULAR | Status: AC
  Filled 2014-09-12: qty 2

## 2014-09-12 MED ORDER — BUPIVACAINE HCL (PF) 0.25 % IJ SOLN
INTRAMUSCULAR | Status: AC
Start: 2014-09-12 — End: 2014-09-12
  Filled 2014-09-12: qty 30

## 2014-09-12 MED ORDER — OXYCODONE-ACETAMINOPHEN 7.5-325 MG PO TABS
1.0000 | ORAL_TABLET | ORAL | Status: DC | PRN
Start: 2014-09-12 — End: 2016-03-31

## 2014-09-12 MED ORDER — METOCLOPRAMIDE HCL 5 MG/ML IJ SOLN: 10.0000 mg | Freq: Once | INTRAMUSCULAR | Status: DC | PRN

## 2014-09-12 SURGICAL SUPPLY — 49 items
BAG SPEC RTRVL LRG 6X4 10 (ENDOMECHANICALS) ×1
BARRIER ADHS 3X4 INTERCEED (GAUZE/BANDAGES/DRESSINGS) ×1 IMPLANT
BRR ADH 4X3 ABS CNTRL BYND (GAUZE/BANDAGES/DRESSINGS) ×1
CHLORAPREP W/TINT 26ML (MISCELLANEOUS) ×2 IMPLANT
CLOTH BEACON ORANGE TIMEOUT ST (SAFETY) ×2 IMPLANT
CONT PATH 16OZ SNAP LID 3702 (MISCELLANEOUS) ×2 IMPLANT
COVER BACK TABLE 60X90IN (DRAPES) ×4 IMPLANT
COVER TIP SHEARS 8 DVNC (MISCELLANEOUS) ×1 IMPLANT
COVER TIP SHEARS 8MM DA VINCI (MISCELLANEOUS) ×1
DECANTER SPIKE VIAL GLASS SM (MISCELLANEOUS) ×2 IMPLANT
DRAPE WARM FLUID 44X44 (DRAPE) ×2 IMPLANT
DRESSING OPSITE X SMALL 2X3 (GAUZE/BANDAGES/DRESSINGS) ×1 IMPLANT
ELECT REM PT RETURN 9FT ADLT (ELECTROSURGICAL) ×2
ELECTRODE REM PT RTRN 9FT ADLT (ELECTROSURGICAL) ×1 IMPLANT
GAUZE VASELINE 3X9 (GAUZE/BANDAGES/DRESSINGS) IMPLANT
GLOVE BIO SURGEON STRL SZ 6.5 (GLOVE) ×2 IMPLANT
GLOVE BIOGEL PI IND STRL 7.0 (GLOVE) ×1 IMPLANT
GLOVE BIOGEL PI INDICATOR 7.0 (GLOVE) ×1
KIT ACCESSORY DA VINCI DISP (KITS) ×1
KIT ACCESSORY DVNC DISP (KITS) ×1 IMPLANT
LIQUID BAND (GAUZE/BANDAGES/DRESSINGS) ×4 IMPLANT
PACK ROBOT WH (CUSTOM PROCEDURE TRAY) ×2 IMPLANT
PACK ROBOTIC GOWN (GOWN DISPOSABLE) ×2 IMPLANT
PAD POSITIONER PINK NONSTERILE (MISCELLANEOUS) ×2 IMPLANT
PAD PREP 24X48 CUFFED NSTRL (MISCELLANEOUS) ×4 IMPLANT
POUCH SPECIMEN RETRIEVAL 10MM (ENDOMECHANICALS) ×1 IMPLANT
PROTECTOR NERVE ULNAR (MISCELLANEOUS) ×2 IMPLANT
SET IRRIG TUBING LAPAROSCOPIC (IRRIGATION / IRRIGATOR) ×4 IMPLANT
SET TRI-LUMEN FLTR TB AIRSEAL (TUBING) IMPLANT
SUT VIC AB 3-0 SH 27 (SUTURE)
SUT VIC AB 3-0 SH 27X BRD (SUTURE) IMPLANT
SUT VIC AB 4-0 PS2 27 (SUTURE) ×4 IMPLANT
SUT VICRYL 0 UR6 27IN ABS (SUTURE) ×4 IMPLANT
SYSTEM CONVERTIBLE TROCAR (TROCAR) IMPLANT
TIP UTERINE 5.1X6CM LAV DISP (MISCELLANEOUS) IMPLANT
TIP UTERINE 6.7X10CM GRN DISP (MISCELLANEOUS) IMPLANT
TIP UTERINE 6.7X6CM WHT DISP (MISCELLANEOUS) IMPLANT
TIP UTERINE 6.7X8CM BLUE DISP (MISCELLANEOUS) IMPLANT
TOWEL OR 17X24 6PK STRL BLUE (TOWEL DISPOSABLE) ×6 IMPLANT
TRAY FOLEY BAG SILVER LF 16FR (CATHETERS) ×2 IMPLANT
TROCAR 12M 150ML BLUNT (TROCAR) IMPLANT
TROCAR DISP BLADELESS 8 DVNC (TROCAR) ×1 IMPLANT
TROCAR DISP BLADELESS 8MM (TROCAR) ×1
TROCAR OPTI TIP 12M 100M (ENDOMECHANICALS) IMPLANT
TROCAR PORT AIRSEAL 5X120 (TROCAR) IMPLANT
TROCAR XCEL 12X100 BLDLESS (ENDOMECHANICALS) ×2 IMPLANT
TROCAR XCEL NON-BLD 5MMX100MML (ENDOMECHANICALS) ×2 IMPLANT
WARMER LAPAROSCOPE (MISCELLANEOUS) ×2 IMPLANT
WATER STERILE IRR 1000ML POUR (IV SOLUTION) ×6 IMPLANT

## 2014-09-12 NOTE — Discharge Instructions (Addendum)
DISCHARGE INSTRUCTIONS: Laparoscopy  The following instructions have been prepared to help you care for yourself upon your return home today.  May Remove Scop patch on or before 09/14/2014  May take Ibuprofen products after 4:00 pm as needed for pain.  Do not take aspirin. It can cause bleeding.  May use stool softner while taking Narcotic to prevent constipation.  Drink [employer] and Liquids.  Wound care:  Do not get the incision wet for the first 24 hours. The incision should be kept clean and dry.  The honeycomb dressings may be removed 48 hours after surgery.  The white dressing can be removed  the day after surgery.  Should the incision become sore, red, and swollen after the first week, check with your doctor.  Personal hygiene:  Shower the day after your procedure.  Activity and limitations:  Do NOT drive or operate any equipment today.  Do NOT lift anything more than 5 pounds for 2-3 weeks after surgery.  Do NOT rest in bed all day.  Walking is encouraged. Walk each day, starting slowly with 5-minute walks 3 or 4 times a day. Slowly increase the length of your walks.  Walk up and down stairs slowly.  Do NOT do strenuous activities, such as golfing, playing tennis, bowling, running, biking, weight lifting, gardening, mowing, or vacuuming for 2-4 weeks. Ask your doctor when it is okay to start.  Do not have sexual intercourse until your health care provider says it is OK.  Diet: Eat a light meal as desired this evening. You may resume your usual diet tomorrow.  Return to work: This is dependent on the type of work you do. For the most part you can return to a desk job within a week of surgery. If you are more active at work, please discuss this with your doctor.  What to expect after your surgery: You may have a slight burning sensation when you urinate on the first day. You may have a very small amount of blood in the urine. Expect to have a small amount of  vaginal discharge/light bleeding for 1-2 weeks. It is not unusual to have abdominal soreness and bruising for up to 2 weeks. You may be tired and need more rest for about 1 week. You may experience shoulder pain for 24-72 hours. Lying flat in bed may relieve it.  Call your doctor for any of the following:  Develop a fever of 100.4 or greater  Inability to urinate 6 hours after discharge from hospital  Severe pain not relieved by pain medications  Persistent of heavy bleeding at incision site  Redness or swelling around incision site after a week  Increasing nausea or vomiting

## 2014-09-12 NOTE — Transfer of Care (Signed)
Immediate Anesthesia Transfer of Care Note  Patient: Courtney Leonard  Procedure(s) Performed: Procedure(s): ROBOTIC ASSISTED Left SALPINGO OOPHORECTOMY/Right Salpingectomy/Pelvic Washings (Left)  Patient Location: PACU  Anesthesia Type:General  Level of Consciousness: awake, alert  and oriented  Airway & Oxygen Therapy: Patient Spontanous Breathing and Patient connected to nasal cannula oxygen  Post-op Assessment: Report given to RN, Post -op Vital signs reviewed and stable and Patient moving all extremities  Post vital signs: Reviewed and stable  Last Vitals:  Filed Vitals:   09/12/14 0701  BP: 104/55  Pulse: 66  Temp: 36.7 C  Resp: 16    Complications: No apparent anesthesia complications

## 2014-09-12 NOTE — Anesthesia Postprocedure Evaluation (Signed)
Anesthesia Post Note  Patient: Courtney Leonard  Procedure(s) Performed: Procedure(s) (LRB): ROBOTIC ASSISTED Left SALPINGO OOPHORECTOMY/Right Salpingectomy/Pelvic Washings (Left)  Anesthesia type: General  Patient location: PACU  Post pain: Pain level controlled  Post assessment: Post-op Vital signs reviewed  Last Vitals:  Filed Vitals:   09/12/14 1100  BP: 82/39  Pulse: 62  Temp:   Resp: 14    Post vital signs: Reviewed  Level of consciousness: sedated  Complications: No apparent anesthesia complications

## 2014-09-12 NOTE — Discharge Summary (Signed)
  Physician Discharge Summary  Patient ID: Courtney Leonard MRN: [id number] DOB/AGE: 1966/07/01 49 y.o.  Admit date: 09/12/2014 Discharge date: 09/12/2014  Admission Diagnoses: Left Complex Ovarian Cyst  Discharge Diagnoses: Left Complex Ovarian Cyst, mild adhesions        Active Problems:   * No active hospital problems. *   Discharged Condition: good  Hospital Course: Outpatient  Consults: None  Treatments: surgery: Robotic left Oophorectomy, Bilateral Salpingectomy, Lysis of Adhesions, Washings.  Disposition: 01-Home or Self Care     Medication List    STOP taking these medications        HYDROcodone-acetaminophen 5-325 MG per tablet  Commonly known as:  NORCO/VICODIN      TAKE these medications        ibuprofen 200 MG tablet  Commonly known as:  ADVIL,MOTRIN  Take 800 mg by mouth every 6 (six) hours as needed for moderate pain.     levothyroxine 75 MCG tablet  Commonly known as:  SYNTHROID, LEVOTHROID  Take 75 mcg by mouth daily before breakfast.     oxyCODONE-acetaminophen 7.5-325 MG per tablet  Commonly known as:  PERCOCET  Take 1 tablet by mouth every 4 (four) hours as needed for pain.     sertraline 100 MG tablet  Commonly known as:  ZOLOFT  Take 100 mg by mouth daily.     SUMAtriptan 50 MG tablet  Commonly known as:  IMITREX  Take 50 mg by mouth every 2 (two) hours as needed for migraine or headache. May repeat in 2 hours if headache persists or recurs.     topiramate 100 MG tablet  Commonly known as:  TOPAMAX  Take 100 mg by mouth 2 (two) times daily.           Follow-up Information    Follow up with Solene Hereford,MARIE-LYNE, MD In 3 weeks.   Specialty:  Obstetrics and Gynecology   Contact information:   [place]  [address] [phone]       Signed: Princess Bruins, MD 09/12/2014, 10:39 AM

## 2014-09-12 NOTE — Op Note (Signed)
09/12/2014  10:15 AM  PATIENT:  Courtney Leonard  49 y.o. female  PRE-OPERATIVE DIAGNOSIS:  Left Complex Ovarian Cyst  POST-OPERATIVE DIAGNOSIS:  Left Complex Ovarian Cyst ruptured prior to surgery, Right simple Ovarian Cyst and paratubal lesion, Mild pelvic Adhesions  PROCEDURE:  Procedure(s): ROBOTIC ASSISTED Left SALPINGO OOPHORECTOMY/Right Salpingectomy and excision of paratubal lesion/Lysis of Adhesions/Pelvic Washings  SURGEON:  Surgeon(s): Princess Bruins, MD  ASSISTANTS: Julianne Handler   ANESTHESIA:   general   PROCEDURE:  Under general anesthesia with endotracheal intubation, the patient is an lithotomy position. She is prepped with ChloraPrep on the abdomen and with Betadine on the suprapubic, vulvar and vaginal areas. She is draped as usual. A Foley is inserted in the bladder. She is status post hysterectomy therefore just a sponge on a stick is used vaginally.  We infiltrate the supraumbilical area with Marcaine one quarter plana. We make a 1.5 cm incision with the scalpel at that level. We opened the aponeurosis under direct vision with Mayo scissors. An open the peritoneum under direct vision with Mayo scissors as well. A pursestring stitch of Vicryl 0 is done on the aponeurosis. The #is inserted at that level and up pneumoperitoneum is created with CO2.  The camera is inserted and the abdominopelvic cavities are inspected. The anterior abdominal wall is free of adhesions.  We note a cyst on the left ovary which ruptured prior to the surgery.  The left ovary is still enlarged from it and floppy.  A small simple cyst is present on the right ovary and a right paratubal 1 cm lesion.  We removed the camera.  A semicircular configuration is used for port placement. Marcaine one quarter plain was infiltrated at all sites. A small incision is done with the scalpel. All ports are inserted under direct vision.  2 robotic ports on the right side.  One robotic ports on the lower left. And the  assistant ports on the upper left.  The patient is positioned in steep Trendelenburg. The robot is docked from the right side.  The robotic instruments are inserted under direct vision with the EndoShears scissor and the first arm, the PK in the second arm and the fenestrated clamp in the third arm. We go to the console.  No abnormality is seen in the abdomen.  The pelvis reveals the absence of the uterus. Both distal tubes are present.  The ovaries were described above.  Mild adhesions are present in the pelvis between the omentum and the peritoneal wall bilaterally.  Pictures are taken.  Both ureters are visualized and normal anatomy position.  Washings are done.  The omental adhesions are lysed.  We then go on the right side.  The 1 cm paratubal lesion is excised and removed from the pelvis through the assistant port.  The right mesosalpinx is cauterized in section for complete excision of the right distal tube.  This is removed from the pelvic cavity through the assistant port.  The simple cyst on the right ovary is drained.  Hemostasis is completed with the PK at that level.  We then go on the left side.  The left infundibulopelvic ligament is cauterized and sectioned.  We then followed the inferior aspect of the left ovary with cauterization and section.  The left ovary and left distal tube are completely excised.  The specimen was held with a clamp through the assistant port.  All robotic instruments were removed.  The robot is undocked.  We continue by laparoscopy.  The specimen  is removed with an Endobag through the supraumbilical port.  All specimens are sent together to pathology.  Hemostasis was confirmed at all levels.  We infiltrate Pubivicaine in the abdominopelvic cavities for pain control.  We then used Interceed to reduce the risk of adhesions at the level of the removed left adnexa.  Pictures were taken.  All laparoscopy instruments are removed. All ports are removed under direct vision. The CO2  is evacuated.  The supraumbilical incision is closed by attaching the pursestring stitch of the aponeurosis.  All incisions are closed with a subcuticular stitch of Vicryl 4-0.  The Dermabond was added on all incisions.  The sponge on a stick was removed from the vagina. The Foley was removed from the bladder. The patient was brought to recovery room in good and stable status.  ESTIMATED BLOOD LOSS: 25 cc   Intake/Output Summary (Last 24 hours) at 09/12/14 1015 Last data filed at 09/12/14 1012  Gross per 24 hour  Intake   1000 ml  Output    175 ml  Net    825 ml     BLOOD ADMINISTERED:none   LOCAL MEDICATIONS USED:  MARCAINE    SPECIMEN:  Source of Specimen:  Left ovary, bilateral tubes, Right paratubal lesion  DISPOSITION OF SPECIMEN:  PATHOLOGY  COUNTS:  YES  PLAN OF CARE: Transfer to PACU  Princess Bruins MD  09/12/2014 at 10:15 am

## 2014-09-12 NOTE — H&P (Signed)
Courtney Leonard is an 49 y.o. female  G2P2 married  RP:  Left complex ovarian cyst for Robotic Left Oophorectomy, bilateral Salpingectomy, washings  Pertinent Gynecological History: Menses: S/P Hysterectomy Blood transfusions: none Sexually transmitted diseases: no past history Previous GYN Procedures:  Vaginal Hysterectomy, LPS, C/S Last mammogram: normal Last pap: normal   Menstrual History:  No LMP recorded. Patient has had a hysterectomy.    Past Medical History  Diagnosis Date  . Depression   . Migraine   . Thyroid disease     Past Surgical History  Procedure Laterality Date  . Appendectomy      c-section  . Abdominal hysterectomy    . Laproscopy    . Knee surgery      RT knee  . Cesarean section      Family History  Problem Relation Age of Onset  . Diabetes Mother   . Hypertension Mother   . Atrial fibrillation Father   . Prostate cancer Father     Social History:  reports that she quit smoking about 13 years ago. She does not have any smokeless tobacco history on file. She reports that she drinks alcohol. Her drug history is not on file.  Allergies: No Known Allergies  Prescriptions prior to admission  Medication Sig Dispense Refill Last Dose  . ibuprofen (ADVIL,MOTRIN) 200 MG tablet Take 800 mg by mouth every 6 (six) hours as needed for moderate pain.     Marland Kitchen levothyroxine (SYNTHROID, LEVOTHROID) 75 MCG tablet Take 75 mcg by mouth daily before breakfast.     . sertraline (ZOLOFT) 100 MG tablet Take 100 mg by mouth daily.     . SUMAtriptan (IMITREX) 50 MG tablet Take 50 mg by mouth every 2 (two) hours as needed for migraine or headache. May repeat in 2 hours if headache persists or recurs.     . topiramate (TOPAMAX) 100 MG tablet Take 100 mg by mouth 2 (two) times daily.     Marland Kitchen HYDROcodone-acetaminophen (NORCO/VICODIN) 5-325 MG per tablet Take 1-2 tablets by mouth every 6 (six) hours as needed (for pain). (Patient not taking: Reported on 08/30/2014) 20 tablet  0     ROS  There were no vitals taken for this visit. Physical Exam   MRI 08/16/14:  6.4 cm complex left ovarian cyst (small solid portion).  No lymphadenopathy.    Ca125, CEA neg.    Assessment/Plan:  Left complex ovarian cyst 6.4 cm.  Ca125 negative for Robotic Left Oophorectomy, bilateral Salpingectomy, washings.  Surgery and risks reviewed.  Merlyn Conley,MARIE-LYNE 09/12/2014, 6:53 AM

## 2014-09-12 NOTE — Anesthesia Procedure Notes (Signed)
Procedure Name: Intubation Date/Time: 09/12/2014 8:33 AM Performed by: Sabra Heck L Pre-anesthesia Checklist: Patient identified, Timeout performed, Emergency Drugs available, Suction available and Patient being monitored Patient Re-evaluated:Patient Re-evaluated prior to inductionOxygen Delivery Method: Circle system utilized Preoxygenation: Pre-oxygenation with 100% oxygen Intubation Type: IV induction Ventilation: Mask ventilation without difficulty Laryngoscope Size: Miller and 2 Grade View: Grade I Tube type: Oral Tube size: 7.0 mm Number of attempts: 1 Airway Equipment and Method: Stylet Placement Confirmation: ETT inserted through vocal cords under direct vision,  breath sounds checked- equal and bilateral,  positive ETCO2 and CO2 detector Secured at: 19 cm Tube secured with: Tape Dental Injury: Teeth and Oropharynx as per pre-operative assessment

## 2014-09-13 MED FILL — Heparin Sodium (Porcine) Inj 5000 Unit/ML: INTRAMUSCULAR | Qty: 1 | Status: AC

## 2014-09-15 ENCOUNTER — Encounter (HOSPITAL_COMMUNITY): Payer: Self-pay | Admitting: Obstetrics & Gynecology

## 2014-09-18 ENCOUNTER — Ambulatory Visit: Payer: 59 | Attending: Sports Medicine | Admitting: Occupational Therapy

## 2014-10-09 ENCOUNTER — Ambulatory Visit: Payer: PRIVATE HEALTH INSURANCE | Attending: Sports Medicine | Admitting: Occupational Therapy

## 2014-10-09 ENCOUNTER — Encounter: Payer: Self-pay | Admitting: Occupational Therapy

## 2014-10-09 DIAGNOSIS — M25532 Pain in left wrist: Secondary | ICD-10-CM | POA: Diagnosis present

## 2014-10-09 DIAGNOSIS — M6289 Other specified disorders of muscle: Secondary | ICD-10-CM | POA: Diagnosis present

## 2014-10-09 DIAGNOSIS — R29898 Other symptoms and signs involving the musculoskeletal system: Secondary | ICD-10-CM

## 2014-10-09 NOTE — Therapy (Signed)
[facility] [address] [place], [place], [address] Phone: [phone]   Fax:  [phone]  Occupational Therapy Evaluation  Patient Details  Name: Courtney Leonard MRN: [id number] Date of Birth: 1966-06-17 Referring Provider:  Deborah Chalk, FNP  Encounter Date: 10/09/2014      OT End of Session - 10/09/14 1254    Visit Number 1   Number of Visits 9   Date for OT Re-Evaluation 11/09/14   Authorization Type WC ([facility] employee)   Authorization Time Period awaiting authorized time and visits   OT Start Time 3716   OT Stop Time 1230   OT Time Calculation (min) 45 min      Past Medical History  Diagnosis Date  . Depression   . Migraine   . Thyroid disease     Past Surgical History  Procedure Laterality Date  . Appendectomy      c-section  . Abdominal hysterectomy    . Laproscopy    . Knee surgery      RT knee  . Cesarean section    . Robotic assisted salpingo oopherectomy Left 09/12/2014    Procedure: ROBOTIC ASSISTED Left SALPINGO OOPHORECTOMY/Right Salpingectomy/Pelvic Washings;  Surgeon: Princess Bruins, MD;  Location: [facility];  Service: Gynecology;  Laterality: Left;    There were no vitals taken for this visit.  Visit Diagnosis:  Pain, wrist joint, left - Plan: Ot plan of care cert/re-cert  Weakness of left hand - Plan: Ot plan of care cert/re-cert      Subjective Assessment - 10/09/14 1200    Symptoms numbness, pain in Lt wrist and fingers ulnar side   Repetition Increases Symptoms   Patient Stated Goals return strength and motion in Lt hand   Currently in Pain? Yes   Pain Score 3    Pain Location Wrist  and fingers (ulnar side)   Pain Orientation Left   Pain Descriptors / Indicators Aching   Pain Type Chronic pain   Pain Onset More than a month ago   Pain Frequency Intermittent   Aggravating Factors  lifting and gripping   Pain Relieving Factors nothing          [facility] OT  Assessment - 10/09/14 1205    Assessment   Diagnosis numbness, wrist pain and injury Lt wrist/forearm   Onset Date 03/19/14   Prior Therapy 1 O.T. 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   ADL   ADL comments independent w/ BADLS and IADLS   Mobility   Mobility Status Independent   Written Expression   Dominant Hand Right   Handwriting 100% legible   Sensation   Light Touch Appears Intact   Hot/Cold Appears Intact   Additional Comments Consistent w/ localization and 2 pt discrimination   Coordination   Gross Motor Movements are Fluid and Coordinated Yes   9 Hole Peg Test Right;Left   Right 9 Hole Peg Test 20.78 sec   Left 9 Hole Peg Test 24.10 sec   ROM / Strength   AROM / PROM / Strength AROM;Strength   AROM   Overall AROM  Within functional limits for tasks performed   Overall AROM Comments intrinsic movement  intact (finger abd/add, intrisic +)   Strength   Overall Strength Within functional limits for tasks performed   Hand Function   Right Hand Grip (lbs) 61 lbs   Right Hand Lateral Pinch 15 lbs   Right Hand 3 Point Pinch 17 lbs   Left Hand Grip (lbs) 42 lbs   Left Hand Lateral Pinch 16 lbs   Left 3 point pinch 14 lbs                            OT Long Term Goals - 10/09/14 1301    OT LONG TERM GOAL #1   Title Independent w/ updated HEP (DUE 11/09/14)   Time 4   Period Weeks   Status New   OT LONG TERM GOAL #2   Title Grip strength to be 50 lbs or greater Lt hand   Baseline 42 lbs   Time 4   Period Weeks   Status New   OT LONG TERM GOAL #3   Title Pt to id and utilize prn alternative pain management strategies for Lt wrist   Time 4   Period Weeks    Status New               Plan - 10/09/14 1256    Clinical Impression Statement Pt is a 49 y.o. female who presents to outpatient rehab s/p Lt wrist and forearm injury in August 2015 while at work. Pt presents with chronic mild pain and decreased strength Lt non dominant hand/wrist   Pt will benefit from skilled therapeutic intervention in order to improve on the following deficits (Retired) Decreased activity tolerance;Impaired UE functional use;Decreased strength;Pain   Rehab Potential Good   OT Frequency 2x / week   OT Duration 4 weeks  plus eval   OT Treatment/Interventions Moist Heat;Fluidtherapy;DME and/or AE instruction;Splinting;Patient/family education;Therapeutic exercises;Therapeutic exercise;Ultrasound;Therapeutic activities;Passive range of motion;Manual Therapy   Plan Pt to bring in previously issued HEP and putty - to update prn. Also to provide coordination ex's for in hand translation that involved 4th and 5th digit   Consulted and Agree with Plan of Care Patient        Problem List Patient Active Problem List   Diagnosis Date Noted  . ANXIETY 01/08/2011    Carey Bullocks, OTR/L 10/09/2014, 1:07 PM  [facility] [address] [place], [place], [address] Phone: [phone]   Fax:  [phone]

## 2014-10-16 ENCOUNTER — Ambulatory Visit: Payer: PRIVATE HEALTH INSURANCE | Admitting: Occupational Therapy

## 2014-10-16 DIAGNOSIS — M25532 Pain in left wrist: Secondary | ICD-10-CM

## 2014-10-16 DIAGNOSIS — R29898 Other symptoms and signs involving the musculoskeletal system: Secondary | ICD-10-CM

## 2014-10-16 NOTE — Patient Instructions (Signed)
  Coordination Activities  Perform the following activities for 10 minutes 1-2 times per day with left hand(s).   Rotate ball in fingertips (clockwise and counter-clockwise).  Pick up coins one at a time until you get 5-10 in your hand, then move coins from palm to fingertips to stack one at a time.  roll pen from fingertips to base of palm, then back to fingertips    Wrist Extension: Resisted   With left palm down, _1-2___ pound weight in hand, bend wrist up. Return slowly. Repeat _10-15___ times per set.  Do __3__ sessions per day.   Wrist Flexion: Resisted   With left palm up, __1-2__ pound weight in hand, bend wrist up. Return slowly. Repeat _10-15___ times per set.  Do __3__ sessions per day.   Wrist Radial Deviation: Resisted   With left thumb up, __1-2__ pound weight in hand, bend wrist up. Return slowly. Repeat _10-15___ times per set.  Do _3___ sessions per day.  Copyright  VHI. All rights reserved.

## 2014-10-16 NOTE — Therapy (Signed)
[facility] [address] [place], [place], [address] Phone: [phone]   Fax:  [phone]  Occupational Therapy Treatment  Patient Details  Name: MAHIRA PETRAS MRN: [id number] Date of Birth: [birth year] Referring Provider:  Deborah Chalk, FNP  Encounter Date: 10/16/2014      OT End of Session - 10/16/14 1410    Visit Number 2   Number of Visits 9   Date for OT Re-Evaluation 11/09/14   Authorization Type WC ([facility] employee)   Authorization Time Period approved 8   Authorization - Visit Number 2   Authorization - Number of Visits 8   OT Start Time 1324   OT Stop Time 1400   OT Time Calculation (min) 45 min   Activity Tolerance Patient tolerated treatment well      Past Medical History  Diagnosis Date  . Depression   . Migraine   . Thyroid disease     Past Surgical History  Procedure Laterality Date  . Appendectomy      c-section  . Abdominal hysterectomy    . Laproscopy    . Knee surgery      RT knee  . Cesarean section    . Robotic assisted salpingo oopherectomy Left 09/12/2014    Procedure: ROBOTIC ASSISTED Left SALPINGO OOPHORECTOMY/Right Salpingectomy/Pelvic Washings;  Surgeon: Princess Bruins, MD;  Location: [facility];  Service: Gynecology;  Laterality: Left;    There were no vitals filed for this visit.  Visit Diagnosis:  Pain, wrist joint, left  Weakness of left hand      Subjective Assessment - 10/16/14 1322    Symptoms numbness, pain in Lt wrist and fingers ulnar side   Repetition Increases Symptoms   Patient Stated Goals return strength and motion in Lt hand   Currently in Pain? Yes   Pain Score 2    Pain Location Wrist  and fingers ulnar side   Pain Orientation Left   Pain Descriptors / Indicators Aching   Pain Type Chronic pain   Pain Onset More than a month ago   Pain Frequency Intermittent   Aggravating Factors  lifting and gripping   Pain Relieving Factors nothing                     OT Treatments/Exercises (OP) - 10/16/14 0001    Exercises   Exercises Wrist;Hand   Wrist Exercises   Other wrist exercises Wrist flexion, extension, and radial deviation x 10 reps each with 1 lb. weight   Hand Exercises   Other Hand Exercises Pt picking up 1" blocks with hand gripper set at 35 lbs resistance for sustained grip strength. Pt with increased fatigue at end of task but no increase in pain.    Other Hand Exercises Updated/consolidated putty ex's previously issued and updated to green resistance for mass grasp exercise   Fine Motor Coordination   Other Fine Motor Exercises Rotating ball in fingertips, and manipulating coins up to 5 at a time (issued in HEP)   Modalities   Modalities Fluidotherapy   LUE Fluidotherapy   Number Minutes Fluidotherapy 12 Minutes   LUE Fluidotherapy Location Hand;Wrist   Comments to decrease pain and stiffness                OT Education - 10/16/14 1349    Education provided Yes   Education Details wrist strengthening HEP, 2 coordination ex's, updated previously issued putty HEP (upgraded to green resistance for composite finger flexion mass  grasp)   Person(s) Educated Patient   Methods Explanation;Demonstration;Handout   Comprehension Verbalized understanding;Returned demonstration             OT Long Term Goals - 10/09/14 1301    OT LONG TERM GOAL #1   Title Independent w/ updated HEP (DUE 11/09/14)   Time 4   Period Weeks   Status New   OT LONG TERM GOAL #2   Title Grip strength to be 50 lbs or greater Lt hand   Baseline 42 lbs   Time 4   Period Weeks   Status New   OT LONG TERM GOAL #3   Title Pt to id and utilize prn alternative pain management strategies for Lt wrist   Time 4   Period Weeks   Status New               Plan - 10/16/14 1408    Clinical Impression Statement Pt is tolerating putty and wrist exercises with no increase in pain during today's session.    Plan  continue fluido, strengthening Lt hand   Consulted and Agree with Plan of Care Patient        Problem List Patient Active Problem List   Diagnosis Date Noted  . ANXIETY 01/08/2011    Carey Bullocks, OTR/L 10/16/2014, 2:11 PM  [facility] [address] [place], [place], [address] Phone: [phone]   Fax:  [phone]

## 2014-10-18 ENCOUNTER — Ambulatory Visit: Payer: PRIVATE HEALTH INSURANCE | Admitting: Occupational Therapy

## 2014-10-18 DIAGNOSIS — R29898 Other symptoms and signs involving the musculoskeletal system: Secondary | ICD-10-CM

## 2014-10-18 DIAGNOSIS — M25532 Pain in left wrist: Secondary | ICD-10-CM | POA: Diagnosis not present

## 2014-10-18 NOTE — Therapy (Signed)
[facility] [address] [place], [place], [address] Phone: [phone]   Fax:  [phone]  Occupational Therapy Treatment  Patient Details  Name: Courtney Leonard MRN: [id number] Date of Birth: Jul 18, 1966 Referring Provider:  Deborah Chalk, FNP  Encounter Date: 10/18/2014      OT End of Session - 10/18/14 1402    Visit Number 3   Number of Visits 9   Date for OT Re-Evaluation 11/09/14   Authorization Type WC ([facility] employee)   Authorization Time Period approved 8   Authorization - Visit Number 3   Authorization - Number of Visits 8   OT Start Time 5053   OT Stop Time 1401   OT Time Calculation (min) 39 min   Activity Tolerance Patient tolerated treatment well      Past Medical History  Diagnosis Date  . Depression   . Migraine   . Thyroid disease     Past Surgical History  Procedure Laterality Date  . Appendectomy      c-section  . Abdominal hysterectomy    . Laproscopy    . Knee surgery      RT knee  . Cesarean section    . Robotic assisted salpingo oopherectomy Left 09/12/2014    Procedure: ROBOTIC ASSISTED Left SALPINGO OOPHORECTOMY/Right Salpingectomy/Pelvic Washings;  Surgeon: Princess Bruins, MD;  Location: [facility];  Service: Gynecology;  Laterality: Left;    There were no vitals filed for this visit.  Visit Diagnosis:  Weakness of left hand  Pain, wrist joint, left      Subjective Assessment - 10/18/14 1329    Symptoms My little finger doesn't want to cooperate and sometimes slides off the steering wheel   Repetition Increases Symptoms   Patient Stated Goals return strength and motion in Lt hand   Currently in Pain? Yes   Pain Score 1    Pain Location Finger (Comment which one)  small   Pain Orientation Left   Pain Descriptors / Indicators Aching   Pain Type Chronic pain   Pain Frequency Intermittent   Aggravating Factors  lifting and gripping   Pain Relieving Factors nothing                     OT Treatments/Exercises (OP) - 10/18/14 1330    Wrist Exercises   Other wrist exercises Wrist flexion, extension, and radial deviation x 10 reps each with 2 lb. weight   Hand Exercises   Digiticizer Mass composite finger flexion x 20 reps with green digiflex. Tip pinch x 10 reps each with red digiflex.    Other Hand Exercises Placing graded clothespins on antenna and removing red to black resistance to increase pinch strength.    Other Hand Exercises Added putty exercise for finger adduction (for small finger to ring finger). Progressed to small finger adduction against gravity with finger weight (red)   LUE Fluidotherapy   Number Minutes Fluidotherapy 10 Minutes   LUE Fluidotherapy Location Hand;Wrist   Comments to decrease pain/stiffness                     OT Long Term Goals - 10/09/14 1301    OT LONG TERM GOAL #1   Title Independent w/ updated HEP (DUE 11/09/14)   Time 4   Period Weeks   Status New   OT LONG TERM GOAL #2   Title Grip strength to be 50 lbs or greater Lt hand   Baseline 42  lbs   Time 4   Period Weeks   Status New   OT LONG TERM GOAL #3   Title Pt to id and utilize prn alternative pain management strategies for Lt wrist   Time 4   Period Weeks   Status New               Plan - 10/18/14 1406    Clinical Impression Statement Pt is tolerating all therapy today without pain. Noted weak finger adduction   Plan continue fluido, strengthening Lt hand        Problem List Patient Active Problem List   Diagnosis Date Noted  . ANXIETY 01/08/2011    Carey Bullocks, OTR/L 10/18/2014, 2:08 PM  [facility] [address] [place], [place], [address] Phone: [phone]   Fax:  [phone]

## 2014-10-23 ENCOUNTER — Ambulatory Visit: Payer: PRIVATE HEALTH INSURANCE | Admitting: Occupational Therapy

## 2014-10-23 DIAGNOSIS — M25532 Pain in left wrist: Secondary | ICD-10-CM | POA: Diagnosis not present

## 2014-10-23 DIAGNOSIS — R29898 Other symptoms and signs involving the musculoskeletal system: Secondary | ICD-10-CM

## 2014-10-23 NOTE — Therapy (Signed)
[facility] [address] [place], [place], [address] Phone: [phone]   Fax:  [phone]  Occupational Therapy Treatment  Patient Details  Name: Courtney Leonard MRN: [id number] Date of Birth: [birth year] Referring Provider:  Deborah Chalk, FNP  Encounter Date: 10/23/2014      OT End of Session - 10/23/14 1602    Visit Number 4   Number of Visits 9   Date for OT Re-Evaluation 11/09/14   Authorization Type WC ([facility] employee)   Authorization Time Period approved 8   Authorization - Visit Number 4   Authorization - Number of Visits 8   OT Start Time 3614   OT Stop Time 1615   OT Time Calculation (min) 38 min   Activity Tolerance Patient tolerated treatment well   Behavior During Therapy [facility] for tasks assessed/performed      Past Medical History  Diagnosis Date  . Depression   . Migraine   . Thyroid disease     Past Surgical History  Procedure Laterality Date  . Appendectomy      c-section  . Abdominal hysterectomy    . Laproscopy    . Knee surgery      RT knee  . Cesarean section    . Robotic assisted salpingo oopherectomy Left 09/12/2014    Procedure: ROBOTIC ASSISTED Left SALPINGO OOPHORECTOMY/Right Salpingectomy/Pelvic Washings;  Surgeon: Princess Bruins, MD;  Location: [facility];  Service: Gynecology;  Laterality: Left;    There were no vitals filed for this visit.  Visit Diagnosis:  Weakness of left hand  Pain, wrist joint, left      Subjective Assessment - 10/23/14 1549    Symptoms "My little finger sometimes hangs out there on it's own"   Patient Stated Goals return strength and motion in Lt hand   Currently in Pain? Yes   Pain Score 1    Pain Location Finger (Comment which one)   Pain Orientation Left   Pain Type Chronic pain   Pain Onset More than a month ago   Pain Frequency Intermittent   Aggravating Factors  gripping steering wheel   Pain Relieving Factors nothing   Multiple  Pain Sites --                    OT Treatments/Exercises (OP) - 10/23/14 0001    Exercises   Exercises Wrist;Hand   Wrist Exercises   Other wrist exercises Wrist flexion, extension, and radial deviation x 10 reps each with 2 lb. weight   Additional Wrist Exercises   Theraputty --  adduct with ring/small finger 10x   Theraputty - Roll green   Theraputty - Grip green 10x   Theraputty - Pinch green, tip pinch, ring /small together 20x    small finger adduct/ abduct 2 sets of 10 reps with red finger weight   Gripper set at 35 lbs to pick up 1" blocks for increased sustained grip, min difficulty. Graded clothespins for increased sustained pinch with tripod grip for index/middle and Ring/ small, red-blue for both, black completed only with index/ middle.  Fluidotherapy x 10 mins prior to exercise due to stiffness/ pain, 111*, no adverse reactions.              OT Long Term Goals - 10/09/14 1301    OT LONG TERM GOAL #1   Title Independent w/ updated HEP (DUE 11/09/14)   Time 4   Period Weeks   Status New   OT LONG  TERM GOAL #2   Title Grip strength to be 50 lbs or greater Lt hand   Baseline 42 lbs   Time 4   Period Weeks   Status New   OT LONG TERM GOAL #3   Title Pt to id and utilize prn alternative pain management strategies for Lt wrist   Time 4   Period Weeks   Status New               Plan - 10/23/14 1605    Clinical Impression Statement Pt is progressing towards goals with decreasing overall pain.   Pt will benefit from skilled therapeutic intervention in order to improve on the following deficits (Retired) Decreased activity tolerance;Impaired UE functional use;Decreased strength;Pain   Rehab Potential Good   OT Frequency 2x / week   OT Duration 4 weeks   Plan fluido, strengthening         Problem List Patient Active Problem List   Diagnosis Date Noted  . ANXIETY 01/08/2011    Carlisle Torgeson 10/23/2014, 4:21 PM Theone Murdoch,  OTR/L [phone] Phone: [phone] 4:21 PM 10/23/2014 [facility] [address] [place], [place], [address] Phone: [phone]   Fax:  [phone]

## 2014-10-30 ENCOUNTER — Ambulatory Visit: Payer: PRIVATE HEALTH INSURANCE | Admitting: Occupational Therapy

## 2014-10-30 DIAGNOSIS — M25532 Pain in left wrist: Secondary | ICD-10-CM

## 2014-10-30 DIAGNOSIS — R29898 Other symptoms and signs involving the musculoskeletal system: Secondary | ICD-10-CM

## 2014-10-30 NOTE — Therapy (Signed)
[facility] [address] [place], [place], [address] Phone: [phone]   Fax:  [phone]  Occupational Therapy Treatment  Patient Details  Name: Courtney Leonard MRN: [id number] Date of Birth: September 11, 1965 Referring Provider:  Deborah Chalk, FNP  Encounter Date: 10/30/2014    Past Medical History  Diagnosis Date  . Depression   . Migraine   . Thyroid disease     Past Surgical History  Procedure Laterality Date  . Appendectomy      c-section  . Abdominal hysterectomy    . Laproscopy    . Knee surgery      RT knee  . Cesarean section    . Robotic assisted salpingo oopherectomy Left 09/12/2014    Procedure: ROBOTIC ASSISTED Left SALPINGO OOPHORECTOMY/Right Salpingectomy/Pelvic Washings;  Surgeon: Princess Bruins, MD;  Location: [facility];  Service: Gynecology;  Laterality: Left;    There were no vitals filed for this visit.  Visit Diagnosis:  Weakness of left hand  Pain, wrist joint, left      Subjective Assessment - 10/30/14 1546    Symptoms It's only the tip of my pinky that hurts   Repetition Increases Symptoms   Patient Stated Goals return strength and motion in Lt hand   Currently in Pain? Yes   Pain Score 2    Pain Location Finger (Comment which one)   Pain Orientation Left   Pain Type Chronic pain   Pain Onset More than a month ago   Pain Frequency Intermittent   Aggravating Factors  gripping steering wheel   Pain Relieving Factors nothing   Multiple Pain Sites No        Treatment: Paraffin x 10 mins to left hand and wrist, for pain relief, no adverse reactions.  wrist flexion/ extension, ulnar/ radial deviation with 2 lbs weight, 20 reps each Red putty exercises for grip, pinch and small finger adduct, 10-20 reps each Finger weight with hand on tabletop x 20 reps, against gravity 20 reps Gripper set at 55 lbs, to pick up 1 inch blocks, for increased sustained grip Graded clothespins  for increased sustained pinch for 3 pt with index and middle then ring/ small.                        OT Long Term Goals - 10/09/14 1301    OT LONG TERM GOAL #1   Title Independent w/ updated HEP (DUE 11/09/14)   Time 4   Period Weeks   Status New   OT LONG TERM GOAL #2   Title Grip strength to be 50 lbs or greater Lt hand   Baseline 42 lbs   Time 4   Period Weeks   Status New   OT LONG TERM GOAL #3   Title Pt to id and utilize prn alternative pain management strategies for Lt wrist   Time 4   Period Weeks   Status New               Problem List Patient Active Problem List   Diagnosis Date Noted  . ANXIETY 01/08/2011    RINE,KATHRYN 10/30/2014, 4:14 PM Theone Murdoch, OTR/L [phone] Phone: [phone] 4:14 PM 10/30/2014 [facility] [address] [place], [place], [address] Phone: [phone]   Fax:  [phone]

## 2014-11-01 ENCOUNTER — Ambulatory Visit: Payer: PRIVATE HEALTH INSURANCE | Attending: Sports Medicine | Admitting: Occupational Therapy

## 2014-11-01 DIAGNOSIS — M6289 Other specified disorders of muscle: Secondary | ICD-10-CM | POA: Diagnosis not present

## 2014-11-01 DIAGNOSIS — M25532 Pain in left wrist: Secondary | ICD-10-CM | POA: Diagnosis not present

## 2014-11-01 DIAGNOSIS — R29898 Other symptoms and signs involving the musculoskeletal system: Secondary | ICD-10-CM

## 2014-11-01 NOTE — Therapy (Signed)
[facility] [address] [place], [place], [address] Phone: [phone]   Fax:  [phone]  Occupational Therapy Treatment  Patient Details  Name: Courtney Leonard MRN: [id number] Date of Birth: 19-Dec-1965 Referring Provider:  Verda Cumins, MD  Encounter Date: 11/01/2014      OT End of Session - 11/01/14 1356    Visit Number 6   Number of Visits 9   Date for OT Re-Evaluation 11/09/14   Authorization Type WC ([facility] employee)   Authorization Time Period approved 8   Authorization - Visit Number 6   Authorization - Number of Visits 8   OT Start Time 1320   OT Stop Time 1400   OT Time Calculation (min) 40 min   Activity Tolerance Patient tolerated treatment well      Past Medical History  Diagnosis Date  . Depression   . Migraine   . Thyroid disease     Past Surgical History  Procedure Laterality Date  . Appendectomy      c-section  . Abdominal hysterectomy    . Laproscopy    . Knee surgery      RT knee  . Cesarean section    . Robotic assisted salpingo oopherectomy Left 09/12/2014    Procedure: ROBOTIC ASSISTED Left SALPINGO OOPHORECTOMY/Right Salpingectomy/Pelvic Washings;  Surgeon: Princess Bruins, MD;  Location: [facility];  Service: Gynecology;  Laterality: Left;    There were no vitals filed for this visit.  Visit Diagnosis:  Weakness of left hand  Pain, wrist joint, left      Subjective Assessment - 11/01/14 1320    Repetition Increases Symptoms   Patient Stated Goals return strength and motion in Lt hand   Currently in Pain? Yes   Pain Score 2    Pain Location Finger (Comment which one)  small finger   Pain Orientation Left   Pain Descriptors / Indicators Aching   Pain Type Chronic pain   Pain Onset More than a month ago   Pain Frequency Intermittent   Aggravating Factors  gripping steering wheel   Pain Relieving Factors nothing                    OT  Treatments/Exercises (OP) - 11/01/14 1321    Wrist Exercises   Other wrist exercises Wrist flexion, extension, and radial deviation x 20 reps each with 2 lb. weight   Hand Exercises   Other Hand Exercises Small finger adduction exercise with finger weight x 20 reps.    Other Hand Exercises Putty exercises: mass grasp, making cone with finger adduction and intrinsic + movement, and pinch ex with ring and small finger.    LUE Paraffin   Number Minutes Paraffin 10 Minutes   LUE Paraffin Location Hand;Wrist   Comments to decrease stiffness/pain                     OT Long Term Goals - 10/09/14 1301    OT LONG TERM GOAL #1   Title Independent w/ updated HEP (DUE 11/09/14)   Time 4   Period Weeks   Status New   OT LONG TERM GOAL #2   Title Grip strength to be 50 lbs or greater Lt hand   Baseline 42 lbs   Time 4   Period Weeks   Status New   OT LONG TERM GOAL #3   Title Pt to id and utilize prn alternative pain management strategies for Lt wrist  Time 4   Period Weeks   Status New               Plan - 11/01/14 1358    Clinical Impression Statement Pt progressing towards goals with overall decreased pain and increased strength   Plan fluido/paraffin, anticipate d/c on 11/06/14, begin checking LTG's   Consulted and Agree with Plan of Care Patient        Problem List Patient Active Problem List   Diagnosis Date Noted  . ANXIETY 01/08/2011    Carey Bullocks, OTR/L 11/01/2014, 2:00 PM  [facility] [address] [place], [place], [address] Phone: [phone]   Fax:  [phone]

## 2014-11-05 ENCOUNTER — Ambulatory Visit: Payer: PRIVATE HEALTH INSURANCE | Attending: Sports Medicine | Admitting: Occupational Therapy

## 2014-11-05 DIAGNOSIS — M25532 Pain in left wrist: Secondary | ICD-10-CM | POA: Insufficient documentation

## 2014-11-05 DIAGNOSIS — M6289 Other specified disorders of muscle: Secondary | ICD-10-CM | POA: Insufficient documentation

## 2014-11-05 DIAGNOSIS — R29898 Other symptoms and signs involving the musculoskeletal system: Secondary | ICD-10-CM

## 2014-11-05 NOTE — Therapy (Signed)
[facility] [address] [place], [place], [address] Phone: [phone]   Fax:  [phone]  Occupational Therapy Treatment  Patient Details  Name: Courtney Leonard MRN: [id number] Date of Birth: 04-16-1966 Referring Provider:  Deborah Chalk, FNP  Encounter Date: 11/05/2014      OT End of Session - 11/05/14 0931    Visit Number 7   Number of Visits 9   Date for OT Re-Evaluation 11/09/14   Authorization Type WC ([facility] employee)   Authorization Time Period approved 8   Authorization - Visit Number 7   Authorization - Number of Visits 8   OT Start Time 0850   OT Stop Time 0930   OT Time Calculation (min) 40 min   Activity Tolerance Patient tolerated treatment well      Past Medical History  Diagnosis Date  . Depression   . Migraine   . Thyroid disease     Past Surgical History  Procedure Laterality Date  . Appendectomy      c-section  . Abdominal hysterectomy    . Laproscopy    . Knee surgery      RT knee  . Cesarean section    . Robotic assisted salpingo oopherectomy Left 09/12/2014    Procedure: ROBOTIC ASSISTED Left SALPINGO OOPHORECTOMY/Right Salpingectomy/Pelvic Washings;  Surgeon: Princess Bruins, MD;  Location: [facility];  Service: Gynecology;  Laterality: Left;    There were no vitals filed for this visit.  Visit Diagnosis:  Weakness of left hand      Subjective Assessment - 11/05/14 0907    Subjective  My finger hasn't hurt for a couple days now   Repetition Increases Symptoms   Patient Stated Goals return strength and motion in Lt hand   Currently in Pain? No/denies                    OT Treatments/Exercises (OP) - 11/05/14 2951    Wrist Exercises   Other wrist exercises Wrist flexion, extension, and radial deviation x 10 reps, 2 sets each with 3 lb. weight   Other wrist exercises Isometric wrist ex's for "tug-of-war" with palms down, palms up, and neutral (thumb up)  holding 15 sec. each way x 2.    Hand Exercises   Other Hand Exercises Gripper set at 55 lbs resistance to pick up 1" blocks for sustained grip strength with min difficulty min drops.    Other Hand Exercises finger adduction against gravity with finger weight on small finger x 20 reps   LUE Fluidotherapy   Number Minutes Fluidotherapy 12 Minutes   LUE Fluidotherapy Location Hand;Wrist   Comments to decrease stiffness                     OT Long Term Goals - 11/05/14 0920    OT LONG TERM GOAL #1   Title Independent w/ updated HEP (DUE 11/09/14)   Time 4   Period Weeks   Status Achieved   OT LONG TERM GOAL #2   Title Grip strength to be 50 lbs or greater Lt hand   Baseline 42 lbs   Time 4   Period Weeks   Status On-going  11/05/14 = 48 lbs   OT LONG TERM GOAL #3   Title Pt to id and utilize prn alternative pain management strategies for Lt wrist   Time 4   Period Weeks   Status Achieved  Plan - 11/05/14 0931    Clinical Impression Statement Pt met LTG's #1 and #3. Pt with no pain over last 2-3 days.    Plan assess LTG #2, D/C next visit        Problem List Patient Active Problem List   Diagnosis Date Noted  . ANXIETY 01/08/2011    Carey Bullocks, OTR/L 11/05/2014, 9:33 AM  [facility] [address] [place], [place], [address] Phone: [phone]   Fax:  [phone]

## 2014-11-06 ENCOUNTER — Ambulatory Visit: Payer: PRIVATE HEALTH INSURANCE | Admitting: Occupational Therapy

## 2014-11-06 DIAGNOSIS — M6289 Other specified disorders of muscle: Secondary | ICD-10-CM | POA: Diagnosis not present

## 2014-11-06 DIAGNOSIS — R29898 Other symptoms and signs involving the musculoskeletal system: Secondary | ICD-10-CM

## 2014-11-06 NOTE — Therapy (Signed)
[facility] [address] [place], [place], [address] Phone: [phone]   Fax:  [phone]  Occupational Therapy Treatment  Patient Details  Name: Courtney Leonard MRN: [id number] Date of Birth: [birth year] Referring Provider:  Deborah Chalk, FNP  Encounter Date: 11/06/2014      OT End of Session - 11/06/14 1437    Visit Number 8   Number of Visits 9   Date for OT Re-Evaluation 11/09/14   Authorization Type WC ([facility] employee)   Authorization Time Period approved 8   Authorization - Visit Number 8   Authorization - Number of Visits 8   OT Start Time 0845   OT Stop Time 0930   OT Time Calculation (min) 45 min   Activity Tolerance Patient tolerated treatment well      Past Medical History  Diagnosis Date  . Depression   . Migraine   . Thyroid disease     Past Surgical History  Procedure Laterality Date  . Appendectomy      c-section  . Abdominal hysterectomy    . Laproscopy    . Knee surgery      RT knee  . Cesarean section    . Robotic assisted salpingo oopherectomy Left 09/12/2014    Procedure: ROBOTIC ASSISTED Left SALPINGO OOPHORECTOMY/Right Salpingectomy/Pelvic Washings;  Surgeon: Princess Bruins, MD;  Location: [facility];  Service: Gynecology;  Laterality: Left;    There were no vitals filed for this visit.  Visit Diagnosis:  Weakness of left hand      Subjective Assessment - 11/06/14 0852    Subjective  "My whole hand is stiff but I think it's due to the colder weather today, not painful"   Repetition Increases Symptoms   Patient Stated Goals return strength and motion in Lt hand   Currently in Pain? No/denies                    OT Treatments/Exercises (OP) - 11/06/14 6283    Wrist Exercises   Other wrist exercises Wrist flexion, extension, and radial deviation x 10 reps, 2 sets each with 3 lb. weight   Other wrist exercises Isometric wrist ex's for "tug-of-war" with palms  down, palms up, and neutral (thumb up) holding 15 sec. each way x 2.    Hand Exercises   Other Hand Exercises Placing graded clothespins on antenna (red to black resistance) Lt hand with min difficulty only with black resistance.    LUE Fluidotherapy   Number Minutes Fluidotherapy 12 Minutes   LUE Fluidotherapy Location Hand;Wrist   Comments to decrease stiffness                     OT Long Term Goals - 11/06/14 0908    OT LONG TERM GOAL #1   Title Independent w/ updated HEP (DUE 11/09/14)   Time 4   Period Weeks   Status Achieved   OT LONG TERM GOAL #2   Title Grip strength to be 50 lbs or greater Lt hand   Baseline 42 lbs   Time 4   Period Weeks   Status Achieved  11/06/14 = 50 lbs    OT LONG TERM GOAL #3   Title Pt to id and utilize prn alternative pain management strategies for Lt wrist   Time 4   Period Weeks   Status Achieved               Plan - 11/06/14 1437  Clinical Impression Statement Pt met all LTG's. Pt has not had any pain this week   Plan D/C O.T.         Problem List Patient Active Problem List   Diagnosis Date Noted  . ANXIETY 01/08/2011    OCCUPATIONAL THERAPY DISCHARGE SUMMARY  Visits from Start of Care: 8  Current functional level related to goals / functional outcomes: SEE ABOVE   Remaining deficits: None   Education / Equipment: HEP  Plan: Patient agrees to discharge.  Patient goals were met. Patient is being discharged due to meeting the stated rehab goals.  ?????       ,  Johnson, OTR/L 11/06/2014, 2:39 PM  [facility] [address] [place], [place], [address] Phone: [phone]   Fax:  [phone]    

## 2014-11-13 ENCOUNTER — Encounter: Payer: 59 | Admitting: Occupational Therapy

## 2015-07-30 ENCOUNTER — Encounter: Payer: Self-pay | Admitting: Emergency Medicine

## 2015-07-30 ENCOUNTER — Emergency Department (INDEPENDENT_AMBULATORY_CARE_PROVIDER_SITE_OTHER)
Admission: EM | Admit: 2015-07-30 | Discharge: 2015-07-30 | Disposition: A | Discharge status: Home / Self Care | Payer: 59 | Source: Home / Self Care | Attending: Family Medicine | Admitting: Family Medicine

## 2015-07-30 DIAGNOSIS — G43009 Migraine without aura, not intractable, without status migrainosus: Secondary | ICD-10-CM | POA: Diagnosis not present

## 2015-07-30 DIAGNOSIS — J069 Acute upper respiratory infection, unspecified: Secondary | ICD-10-CM | POA: Diagnosis not present

## 2015-07-30 DIAGNOSIS — J040 Acute laryngitis: Secondary | ICD-10-CM

## 2015-07-30 DIAGNOSIS — H9203 Otalgia, bilateral: Secondary | ICD-10-CM

## 2015-07-30 LAB — POCT RAPID STREP A (OFFICE): Rapid Strep A Screen: NEGATIVE

## 2015-07-30 MED ORDER — KETOROLAC TROMETHAMINE 60 MG/2ML IM SOLN
60.0000 mg | Freq: Once | INTRAMUSCULAR | Status: AC
  Administered 2015-07-30: 60 mg via INTRAMUSCULAR

## 2015-07-30 MED ORDER — METOCLOPRAMIDE HCL 5 MG/ML IJ SOLN
10.0000 mg | Freq: Once | INTRAMUSCULAR | Status: AC
  Administered 2015-07-30: 10 mg via INTRAMUSCULAR

## 2015-07-30 MED ORDER — DIPHENHYDRAMINE HCL 12.5 MG/5ML PO ELIX
25.0000 mg | ORAL_SOLUTION | Freq: Once | ORAL | Status: AC
  Administered 2015-07-30: 25 mg via ORAL

## 2015-07-30 MED ORDER — DEXAMETHASONE SODIUM PHOSPHATE 10 MG/ML IJ SOLN
10.0000 mg | Freq: Once | INTRAMUSCULAR | Status: AC
  Administered 2015-07-30: 10 mg via INTRAMUSCULAR

## 2015-07-30 NOTE — ED Provider Notes (Signed)
CSN: [id number]     Arrival date & time 07/30/15  1110 History   First MD Initiated Contact with Patient 07/30/15 1133     Chief Complaint  Patient presents with  . Sore Throat   (Consider location/radiation/quality/duration/timing/severity/associated sxs/prior Treatment) HPI  Pt is a 49yo female presenting to [facility] with c/o 2 day hx of gradually worsening sore throat with hoarse voice and bilateral ear pain and pressure. She is also c/o a generalized, gradually worsening headache that started this morning, c/w prior migraines.  She took Imitrex around 10AM this morning when she woke up, only minimal relief.  Pt believes her head cold caused her current migraine.  Denies fever, chills, n/v/d. Denies sick contacts or recent travel.   Past Medical History  Diagnosis Date  . Depression   . Migraine   . Thyroid disease    Past Surgical History  Procedure Laterality Date  . Appendectomy      c-section  . Abdominal hysterectomy    . Laproscopy    . Knee surgery      RT knee  . Cesarean section    . Robotic assisted salpingo oopherectomy Left 09/12/2014    Procedure: ROBOTIC ASSISTED Left SALPINGO OOPHORECTOMY/Right Salpingectomy/Pelvic Washings;  Surgeon: Princess Bruins, MD;  Location: [facility];  Service: Gynecology;  Laterality: Left;   Family History  Problem Relation Age of Onset  . Diabetes Mother   . Hypertension Mother   . Atrial fibrillation Father   . Prostate cancer Father    Social History  Substance Use Topics  . Smoking status: Former Smoker    Types: Cigarettes    Quit date: 06/28/2001  . Smokeless tobacco: None  . Alcohol Use: Yes     Comment: 1 q wk   OB History    No data available     Review of Systems  Constitutional: Negative for fever and chills.  HENT: Positive for ear pain ( bilateral), rhinorrhea, sore throat and voice change. Negative for congestion and trouble swallowing.   Respiratory: Negative for cough and shortness of breath.    Cardiovascular: Negative for chest pain and palpitations.  Gastrointestinal: Negative for nausea, vomiting, abdominal pain and diarrhea.  Musculoskeletal: Negative for myalgias, back pain and arthralgias.  Skin: Negative for rash.  Neurological: Positive for headaches. Negative for dizziness and light-headedness.    Allergies  Review of patient's allergies indicates no known allergies.  Home Medications   Prior to Admission medications   Medication Sig Start Date End Date Taking? Authorizing Provider  ibuprofen (ADVIL,MOTRIN) 200 MG tablet Take 800 mg by mouth every 6 (six) hours as needed for moderate pain.    Historical Provider, MD  levothyroxine (SYNTHROID, LEVOTHROID) 75 MCG tablet Take 75 mcg by mouth daily before breakfast.    Historical Provider, MD  oxyCODONE-acetaminophen (PERCOCET) 7.5-325 MG per tablet Take 1 tablet by mouth every 4 (four) hours as needed for pain. Patient not taking: Reported on 10/09/2014 09/12/14   Princess Bruins, MD  sertraline (ZOLOFT) 100 MG tablet Take 100 mg by mouth daily.    Historical Provider, MD  SUMAtriptan (IMITREX) 50 MG tablet Take 50 mg by mouth every 2 (two) hours as needed for migraine or headache. May repeat in 2 hours if headache persists or recurs.    Historical Provider, MD  topiramate (TOPAMAX) 100 MG tablet Take 100 mg by mouth 2 (two) times daily.    Historical Provider, MD   Meds Ordered and Administered this Visit   Medications  ketorolac (  TORADOL) injection 60 mg (60 mg Intramuscular Given 07/30/15 1210)  metoCLOPramide (REGLAN) injection 10 mg (10 mg Intramuscular Given 07/30/15 1211)  dexamethasone (DECADRON) injection 10 mg (10 mg Intramuscular Given 07/30/15 1211)  diphenhydrAMINE (BENADRYL) 12.5 MG/5ML elixir 25 mg (25 mg Oral Given 07/30/15 1211)    BP 114/76 mmHg  Pulse 73  Temp(Src) 98.1 F (36.7 C) (Oral)  Wt 150 lb (68.04 kg)  SpO2 99% No data found.   Physical Exam  Constitutional: She is oriented to  person, place, and time. She appears well-developed and well-nourished. No distress.  HENT:  Head: Normocephalic and atraumatic.  Right Ear: Hearing, tympanic membrane, external ear and ear canal normal.  Left Ear: Hearing, tympanic membrane, external ear and ear canal normal.  Nose: Nose normal. Right sinus exhibits no maxillary sinus tenderness and no frontal sinus tenderness. Left sinus exhibits no maxillary sinus tenderness and no frontal sinus tenderness.  Mouth/Throat: Uvula is midline and mucous membranes are normal. Posterior oropharyngeal erythema present. No oropharyngeal exudate, posterior oropharyngeal edema or tonsillar abscesses.  Eyes: Conjunctivae and EOM are normal. Pupils are equal, round, and reactive to light. No scleral icterus.  Neck: Normal range of motion. Neck supple.  Hoarse voice. No stridor.  No meningeal signs.   Cardiovascular: Normal rate, regular rhythm and normal heart sounds.   Pulmonary/Chest: Effort normal and breath sounds normal. No stridor. No respiratory distress. She has no wheezes. She has no rales. She exhibits no tenderness.  Abdominal: Soft. Bowel sounds are normal. She exhibits no distension and no mass. There is no tenderness. There is no rebound and no guarding.  Musculoskeletal: Normal range of motion.  Lymphadenopathy:    She has no cervical adenopathy.  Neurological: She is alert and oriented to person, place, and time. She has normal strength. No cranial nerve deficit or sensory deficit. Coordination and gait normal. GCS eye subscore is 4. GCS verbal subscore is 5. GCS motor subscore is 6.  Alert to person, place and time. Speech is clear. Normal coordination. Normal gait.  Skin: Skin is warm and dry. She is not diaphoretic.  Nursing note and vitals reviewed.   ED Course  Procedures (including critical care time)  Labs Review Labs Reviewed  POCT RAPID STREP A (OFFICE)    Imaging Review No results found.  Tympanometry: normal in  both ears.  MDM   1. Laryngitis   2. Migraine without aura and without status migrainosus, not intractable   3. Acute upper respiratory infection     Pt c/o hoarse voice, bilateral ear pain and sore throat.  Pt also c/o migraine.  Pt agreed to have migraine treated here.    Rapid strep: negative Symptoms likely viral in nature  Tx in UC: Toradol 60mg  IM, Reglan 10mg  IM, Decadron 10mg  IM, and Benadryl 25mg  PO  Pt states pain improved from 8/10 to 4/10. Pt feels comfortable being discharge home.  Advised pt to use acetaminophen and ibuprofen as needed for fever and pain. Encouraged rest and fluids. F/u with PCP in 7-10 days if not improving, sooner if worsening. Pt verbalized understanding and agreement with tx plan.     Noland Fordyce, PA-C 07/30/15 1241

## 2015-07-30 NOTE — ED Notes (Signed)
Pt c/o sore throat, bilateral ear pain and hoarseness x2 days. Denies fever.

## 2015-07-30 NOTE — Discharge Instructions (Signed)
You may take 400-600mg  Ibuprofen (Motrin) every 6-8 hours for fever and pain  Alternate with Tylenol  You may take 500mg  Tylenol every 4-6 hours as needed for fever and pain  Follow-up with your primary care provider next week for recheck of symptoms if not improving.  Be sure to drink plenty of fluids and rest, at least 8hrs of sleep a night, preferably more while you are sick. Return urgent care or go to closest ER if you cannot keep down fluids/signs of dehydration, fever not reducing with Tylenol, difficulty breathing/wheezing, stiff neck, worsening condition, or other concerns (see below)   Laryngitis Laryngitis is inflammation of your vocal cords. This causes hoarseness, coughing, loss of voice, sore throat, or a dry throat. Your vocal cords are two bands of muscles that are found in your throat. When you speak, these cords come together and vibrate. These vibrations come out through your mouth as sound. When your vocal cords are inflamed, your voice sounds different. Laryngitis can be temporary (acute) or long-term (chronic). Most cases of acute laryngitis improve with time. Chronic laryngitis is laryngitis that lasts for more than three weeks. CAUSES Acute laryngitis may be caused by:  A viral infection.  Lots of talking, yelling, or singing. This is also called vocal strain.  Bacterial infections. Chronic laryngitis may be caused by:  Vocal strain.  Injury to your vocal cords.  Acid reflux (gastroesophageal reflux disease or GERD).  Allergies.  Sinus infection.  Smoking.  Alcohol abuse.  Breathing in chemicals or dust.  Growths on the vocal cords. RISK FACTORS Risk factors for laryngitis include:  Smoking.  Alcohol abuse.  Having allergies. SIGNS AND SYMPTOMS Symptoms of laryngitis may include:  Low, hoarse voice.  Loss of voice.  Dry cough.  Sore throat.  Stuffy nose. DIAGNOSIS Laryngitis may be diagnosed by:  Physical exam.  Throat  culture.  Blood test.  Laryngoscopy. This procedure allows your health care provider to look at your vocal cords with a mirror or viewing tube. TREATMENT Treatment for laryngitis depends on what is causing it. Usually, treatment involves resting your voice and using medicines to soothe your throat. However, if your laryngitis is caused by a bacterial infection, you may need to take antibiotic medicine. If your laryngitis is caused by a growth, you may need to have a procedure to remove it. HOME CARE INSTRUCTIONS  Drink enough fluid to keep your urine clear or pale yellow.  Breathe in moist air. Use a humidifier if you live in a dry climate.  Take medicines only as directed by your health care provider.  If you were prescribed an antibiotic medicine, finish it all even if you start to feel better.  Do not smoke cigarettes or electronic cigarettes. If you need help quitting, ask your health care provider.  Talk as little as possible. Also avoid whispering, which can cause vocal strain.  Write instead of talking. Do this until your voice is back to normal. SEEK MEDICAL CARE IF:  You have a fever.  You have increasing pain.  You have difficulty swallowing. SEEK IMMEDIATE MEDICAL CARE IF:  You cough up blood.  You have trouble breathing.   This information is not intended to replace advice given to you by your health care provider. Make sure you discuss any questions you have with your health care provider.   Document Released: 07/20/2005 Document Revised: 08/10/2014 Document Reviewed: 01/02/2014 Elsevier Interactive Patient Education 2016 [place] may help relieve the symptoms  of a cough and cold. They add moisture to the air, which helps mucus to become thinner and less sticky. This makes it easier to breathe and cough up secretions. Cool mist vaporizers do not cause serious burns like hot mist vaporizers, which may also be called steamers  or humidifiers. Vaporizers have not been proven to help with colds. You should not use a vaporizer if you are allergic to mold. HOME CARE INSTRUCTIONS  Follow the package instructions for the vaporizer.  Do not use anything other than distilled water in the vaporizer.  Do not run the vaporizer all of the time. This can cause mold or bacteria to grow in the vaporizer.  Clean the vaporizer after each time it is used.  Clean and dry the vaporizer well before storing it.  Stop using the vaporizer if worsening respiratory symptoms develop.   This information is not intended to replace advice given to you by your health care provider. Make sure you discuss any questions you have with your health care provider.   Document Released: 04/16/2004 Document Revised: 07/25/2013 Document Reviewed: 12/07/2012 Elsevier Interactive Patient Education 2016 Elsevier Inc.  Cough, Adult A cough helps to clear your throat and lungs. A cough may last only 2-3 weeks (acute), or it may last longer than 8 weeks (chronic). Many different things can cause a cough. A cough may be a sign of an illness or another medical condition. HOME CARE  Pay attention to any changes in your cough.  Take medicines only as told by your doctor.  If you were prescribed an antibiotic medicine, take it as told by your doctor. Do not stop taking it even if you start to feel better.  Talk with your doctor before you try using a cough medicine.  Drink enough fluid to keep your pee (urine) clear or pale yellow.  If the air is dry, use a cold steam vaporizer or humidifier in your home.  Stay away from things that make you cough at work or at home.  If your cough is worse at night, try using extra pillows to raise your head up higher while you sleep.  Do not smoke, and try not to be around smoke. If you need help quitting, ask your doctor.  Do not have caffeine.  Do not drink alcohol.  Rest as needed. GET HELP IF:  You  have new problems (symptoms).  You cough up yellow fluid (pus).  Your cough does not get better after 2-3 weeks, or your cough gets worse.  Medicine does not help your cough and you are not sleeping well.  You have pain that gets worse or pain that is not helped with medicine.  You have a fever.  You are losing weight and you do not know why.  You have night sweats. GET HELP RIGHT AWAY IF:  You cough up blood.  You have trouble breathing.  Your heartbeat is very fast.   This information is not intended to replace advice given to you by your health care provider. Make sure you discuss any questions you have with your health care provider.   Document Released: 04/02/2011 Document Revised: 04/10/2015 Document Reviewed: 09/26/2014 Elsevier Interactive Patient Education [employer].

## 2015-08-03 ENCOUNTER — Emergency Department (INDEPENDENT_AMBULATORY_CARE_PROVIDER_SITE_OTHER): Payer: 59

## 2015-08-03 ENCOUNTER — Telehealth: Payer: Self-pay | Admitting: Emergency Medicine

## 2015-08-03 ENCOUNTER — Emergency Department (INDEPENDENT_AMBULATORY_CARE_PROVIDER_SITE_OTHER)
Admission: EM | Admit: 2015-08-03 | Discharge: 2015-08-03 | Disposition: A | Discharge status: Home / Self Care | Payer: 59 | Source: Home / Self Care | Attending: Family Medicine | Admitting: Family Medicine

## 2015-08-03 ENCOUNTER — Encounter: Payer: Self-pay | Admitting: Emergency Medicine

## 2015-08-03 DIAGNOSIS — R05 Cough: Secondary | ICD-10-CM | POA: Diagnosis not present

## 2015-08-03 DIAGNOSIS — J069 Acute upper respiratory infection, unspecified: Secondary | ICD-10-CM

## 2015-08-03 DIAGNOSIS — R112 Nausea with vomiting, unspecified: Secondary | ICD-10-CM

## 2015-08-03 DIAGNOSIS — R52 Pain, unspecified: Secondary | ICD-10-CM | POA: Diagnosis not present

## 2015-08-03 LAB — POCT INFLUENZA A/B
Influenza A, POC: NEGATIVE
Influenza B, POC: NEGATIVE

## 2015-08-03 MED ORDER — ONDANSETRON 4 MG PO TBDP
4.0000 mg | ORAL_TABLET | Freq: Once | ORAL | Status: AC
  Administered 2015-08-03: 4 mg via ORAL

## 2015-08-03 MED ORDER — AZITHROMYCIN 250 MG PO TABS: 250.0000 mg | ORAL_TABLET | Freq: Every day | ORAL | Status: DC

## 2015-08-03 MED ORDER — PROMETHAZINE HCL 25 MG PO TABS: 25.0000 mg | ORAL_TABLET | Freq: Four times a day (QID) | ORAL | Status: DC | PRN

## 2015-08-03 NOTE — Discharge Instructions (Signed)
You may take 400-600mg Ibuprofen (Motrin) every 6-8 hours for fever and pain  °Alternate with Tylenol  °You may take 500mg Tylenol every 4-6 hours as needed for fever and pain  °Follow-up with your primary care provider next week for recheck of symptoms if not improving.  °Be sure to drink plenty of fluids and rest, at least 8hrs of sleep a night, preferably more while you are sick. °Return urgent care or go to closest ER if you cannot keep down fluids/signs of dehydration, fever not reducing with Tylenol, difficulty breathing/wheezing, stiff neck, worsening condition, or other concerns (see below)  °Please take antibiotics as prescribed and be sure to complete entire course even if you start to feel better to ensure infection does not come back. ° ° °Cool Mist Vaporizers °Vaporizers may help relieve the symptoms of a cough and cold. They add moisture to the air, which helps mucus to become thinner and less sticky. This makes it easier to breathe and cough up secretions. Cool mist vaporizers do not cause serious burns like hot mist vaporizers, which may also be called steamers or humidifiers. Vaporizers have not been proven to help with colds. You should not use a vaporizer if you are allergic to mold. °HOME CARE INSTRUCTIONS °· Follow the package instructions for the vaporizer. °· Do not use anything other than distilled water in the vaporizer. °· Do not run the vaporizer all of the time. This can cause mold or bacteria to grow in the vaporizer. °· Clean the vaporizer after each time it is used. °· Clean and dry the vaporizer well before storing it. °· Stop using the vaporizer if worsening respiratory symptoms develop. °  °This information is not intended to replace advice given to you by your health care provider. Make sure you discuss any questions you have with your health care provider. °  °Document Released: 04/16/2004 Document Revised: 07/25/2013 Document Reviewed: 12/07/2012 °Elsevier Interactive Patient  Education ©2016 Elsevier Inc. ° °

## 2015-08-03 NOTE — ED Notes (Signed)
Inquired about patient's status; encourage them to call with questions/concerns.  

## 2015-08-03 NOTE — ED Notes (Signed)
Pt c/o body aches, vomitting x 2 days, right ear pain x 4 days, slight cough.

## 2015-08-03 NOTE — ED Provider Notes (Signed)
CSN: [id number]     Arrival date & time 08/03/15  1235 History   First MD Initiated Contact with Patient 08/03/15 1246     Chief Complaint  Patient presents with  . URI   (Consider location/radiation/quality/duration/timing/severity/associated sxs/prior Treatment) HPI  Pt is a 49yo female presenting to [facility] with c/o worsening URI symptoms that started 4 days ago.  Pt was seen on 12/27 and dx with laryngitis.  Rapid strep was negative.  Pt now c/o body aches, worsening fatigue and vomiting with a mild cough.  Pt states she vomited once within last 24 hours.  Reports mild nausea today but states she has not eaten anything in fear of vomiting again.  She has also not taken any medications today in fear of vomiting. Denies abdominal pain, chest pain or SOB. She did receive the flu vaccine this year.   Past Medical History  Diagnosis Date  . Depression   . Migraine   . Thyroid disease    Past Surgical History  Procedure Laterality Date  . Appendectomy      c-section  . Abdominal hysterectomy    . Laproscopy    . Knee surgery      RT knee  . Cesarean section    . Robotic assisted salpingo oopherectomy Left 09/12/2014    Procedure: ROBOTIC ASSISTED Left SALPINGO OOPHORECTOMY/Right Salpingectomy/Pelvic Washings;  Surgeon: Princess Bruins, MD;  Location: [facility];  Service: Gynecology;  Laterality: Left;   Family History  Problem Relation Age of Onset  . Diabetes Mother   . Hypertension Mother   . Atrial fibrillation Father   . Prostate cancer Father    Social History  Substance Use Topics  . Smoking status: Former Smoker    Types: Cigarettes    Quit date: 06/28/2001  . Smokeless tobacco: None  . Alcohol Use: Yes     Comment: 1 q wk   OB History    No data available     Review of Systems  Constitutional: Positive for fever ( subjective), chills and fatigue.  HENT: Positive for ear pain (Right) and sore throat. Negative for congestion, trouble swallowing and voice change.    Respiratory: Positive for cough. Negative for shortness of breath.   Cardiovascular: Negative for chest pain and palpitations.  Gastrointestinal: Positive for nausea and vomiting. Negative for abdominal pain and diarrhea.  Musculoskeletal: Positive for myalgias and arthralgias. Negative for back pain.  Skin: Negative for rash.  Neurological: Positive for headaches. Negative for dizziness and light-headedness.    Allergies  Review of patient's allergies indicates no known allergies.  Home Medications   Prior to Admission medications   Medication Sig Start Date End Date Taking? Authorizing Provider  azithromycin (ZITHROMAX) 250 MG tablet Take 1 tablet (250 mg total) by mouth daily. Take first 2 tablets together, then 1 every day until finished. 08/03/15   Noland Fordyce, PA-C  ibuprofen (ADVIL,MOTRIN) 200 MG tablet Take 800 mg by mouth every 6 (six) hours as needed for moderate pain.    Historical Provider, MD  levothyroxine (SYNTHROID, LEVOTHROID) 75 MCG tablet Take 75 mcg by mouth daily before breakfast.    Historical Provider, MD  oxyCODONE-acetaminophen (PERCOCET) 7.5-325 MG per tablet Take 1 tablet by mouth every 4 (four) hours as needed for pain. Patient not taking: Reported on 10/09/2014 09/12/14   Princess Bruins, MD  promethazine (PHENERGAN) 25 MG tablet Take 1 tablet (25 mg total) by mouth every 6 (six) hours as needed for nausea or vomiting. 08/03/15   Junie Panning  Hilda Blades, PA-C  sertraline (ZOLOFT) 100 MG tablet Take 100 mg by mouth daily.    Historical Provider, MD  SUMAtriptan (IMITREX) 50 MG tablet Take 50 mg by mouth every 2 (two) hours as needed for migraine or headache. May repeat in 2 hours if headache persists or recurs.    Historical Provider, MD  topiramate (TOPAMAX) 100 MG tablet Take 100 mg by mouth 2 (two) times daily.    Historical Provider, MD   Meds Ordered and Administered this Visit   Medications  ondansetron (ZOFRAN-ODT) disintegrating tablet 4 mg (4 mg Oral Given  08/03/15 1349)    BP 120/78 mmHg  Pulse 62  Temp(Src) 98 F (36.7 C) (Oral)  Ht 5\' 4"  (1.626 m)  Wt 145 lb 8 oz (65.998 kg)  BMI 24.96 kg/m2  SpO2 99% No data found.   Physical Exam  Constitutional: She appears well-developed and well-nourished. No distress.  Pt sitting on exam table, appears fatigued but no acute distress.  HENT:  Head: Normocephalic and atraumatic.  Right Ear: Hearing, tympanic membrane, external ear and ear canal normal.  Left Ear: Hearing, tympanic membrane, external ear and ear canal normal.  Nose: Nose normal.  Mouth/Throat: Uvula is midline and mucous membranes are normal. Posterior oropharyngeal erythema present. No oropharyngeal exudate, posterior oropharyngeal edema or tonsillar abscesses.  Eyes: Conjunctivae are normal. No scleral icterus.  Neck: Normal range of motion. Neck supple.  Cardiovascular: Normal rate, regular rhythm and normal heart sounds.   Pulmonary/Chest: Effort normal and breath sounds normal. No respiratory distress. She has no wheezes. She has no rales. She exhibits no tenderness.  Abdominal: Soft. She exhibits no distension and no mass. There is no tenderness. There is no rebound and no guarding.  Musculoskeletal: Normal range of motion.  Neurological: She is alert.  Skin: Skin is warm and dry. She is not diaphoretic.  Nursing note and vitals reviewed.   ED Course  Procedures (including critical care time)  Labs Review Labs Reviewed  POCT INFLUENZA A/B    Imaging Review Dg Chest 2 View  08/03/2015  CLINICAL DATA:  Cough, chills. EXAM: CHEST  2 VIEW COMPARISON:  None. FINDINGS: The heart size and mediastinal contours are within normal limits. Both lungs are clear. No pneumothorax or pleural effusion is noted. The visualized skeletal structures are unremarkable. IMPRESSION: No active cardiopulmonary disease. Electronically Signed   By: Marijo Conception, M.D.   On: 08/03/2015 13:38      MDM   1. Acute upper respiratory  infection   2. Non-intractable vomiting with nausea, vomiting of unspecified type   3. Body aches    Pt c/o worsening URI symptoms, now with body aches and vomiting.  Vitals: WNL, afebrile, O2 Sat 99% on RA. No respiratory distress.  With reported worsening symptoms and slight cough, CXR ordered to r/o underlying pneumonia.  CXR: negative for active cardiopulmonary disease  Rapid flu: negative  Tympanogram: normal in both ears.  Symptoms still likely viral in nature as it has only been 1 week, however, will prescribe Azithromycin to cover for atypical bacteria and phenergan for nausea.  Encouraged fluids, rest, acetaminophen and ibuprofen for fever or pain. F/u with PCP in 5-7 days if not improving, sooner if worsening. Patient verbalized understanding and agreement with treatment plan.     Noland Fordyce, PA-C 08/03/15 [phone]

## 2015-09-05 MED FILL — AMITRIPTYLINE HCL 25 MG TAB: 25 | 30 days supply | Qty: 60 | Fill #1

## 2015-09-16 MED FILL — LEVOTHYROXINE 75 MCG TABLET: 75 | 30 days supply | Qty: 30 | Fill #5

## 2015-10-14 DIAGNOSIS — N951 Menopausal and female climacteric states: Secondary | ICD-10-CM | POA: Insufficient documentation

## 2015-10-16 MED FILL — LEVOTHYROXINE 75 MCG TABLET: 75 | 30 days supply | Qty: 30 | Fill #6

## 2015-10-16 MED FILL — traZODone HCL 50 MG TABS: 50 | 30 days supply | Qty: 90 | Fill #1

## 2015-10-16 MED FILL — AMITRIPTYLINE HCL 25 MG TAB: 25 | 30 days supply | Qty: 60 | Fill #2

## 2015-11-18 DIAGNOSIS — E039 Hypothyroidism, unspecified: Secondary | ICD-10-CM | POA: Diagnosis not present

## 2015-11-18 DIAGNOSIS — Z0000 Encounter for general adult medical examination without abnormal findings: Secondary | ICD-10-CM | POA: Diagnosis not present

## 2015-11-18 MED FILL — LEVOTHYROXINE 75 MCG TABLET: 75 | 30 days supply | Qty: 30 | Fill #7

## 2015-11-21 DIAGNOSIS — Z1239 Encounter for other screening for malignant neoplasm of breast: Secondary | ICD-10-CM | POA: Diagnosis not present

## 2015-11-21 DIAGNOSIS — M25649 Stiffness of unspecified hand, not elsewhere classified: Secondary | ICD-10-CM | POA: Diagnosis not present

## 2015-11-21 DIAGNOSIS — F5109 Other insomnia not due to a substance or known physiological condition: Secondary | ICD-10-CM | POA: Diagnosis not present

## 2015-11-21 DIAGNOSIS — N951 Menopausal and female climacteric states: Secondary | ICD-10-CM | POA: Diagnosis not present

## 2015-11-21 DIAGNOSIS — F419 Anxiety disorder, unspecified: Secondary | ICD-10-CM | POA: Diagnosis not present

## 2015-11-21 DIAGNOSIS — Z0000 Encounter for general adult medical examination without abnormal findings: Secondary | ICD-10-CM | POA: Diagnosis not present

## 2015-11-21 MED FILL — ALPRAZolam 0.5 MG TABS: 0.5 | 15 days supply | Qty: 30 | Fill #0

## 2015-11-21 MED FILL — VENLAFAXINE HCL 75 MG TAB: 75 | 90 days supply | Qty: 180 | Fill #0

## 2015-11-29 MED FILL — AMITRIPTYLINE HCL 25 MG TAB: 25 | 30 days supply | Qty: 60 | Fill #3

## 2015-11-29 MED FILL — NAPROXEN 500 MG TABLET: 500 | 30 days supply | Qty: 60 | Fill #0

## 2015-12-10 MED FILL — TOPIRAMATE 100 MG TABLET: 100 | 30 days supply | Qty: 60 | Fill #0

## 2015-12-27 MED FILL — traZODone HCL 50 MG TABS: 50 | 90 days supply | Qty: 90 | Fill #0

## 2015-12-27 MED FILL — LEVOTHYROXINE 75 MCG TABLET: 75 | 90 days supply | Qty: 90 | Fill #0

## 2016-01-08 IMAGING — CR DG CHEST 2V
2 series · 2 of 2 positions shown · non-contrast
Comparison: None.

CLINICAL DATA: Cough, chills.

EXAM:
CHEST  2 VIEW

[chest pa]
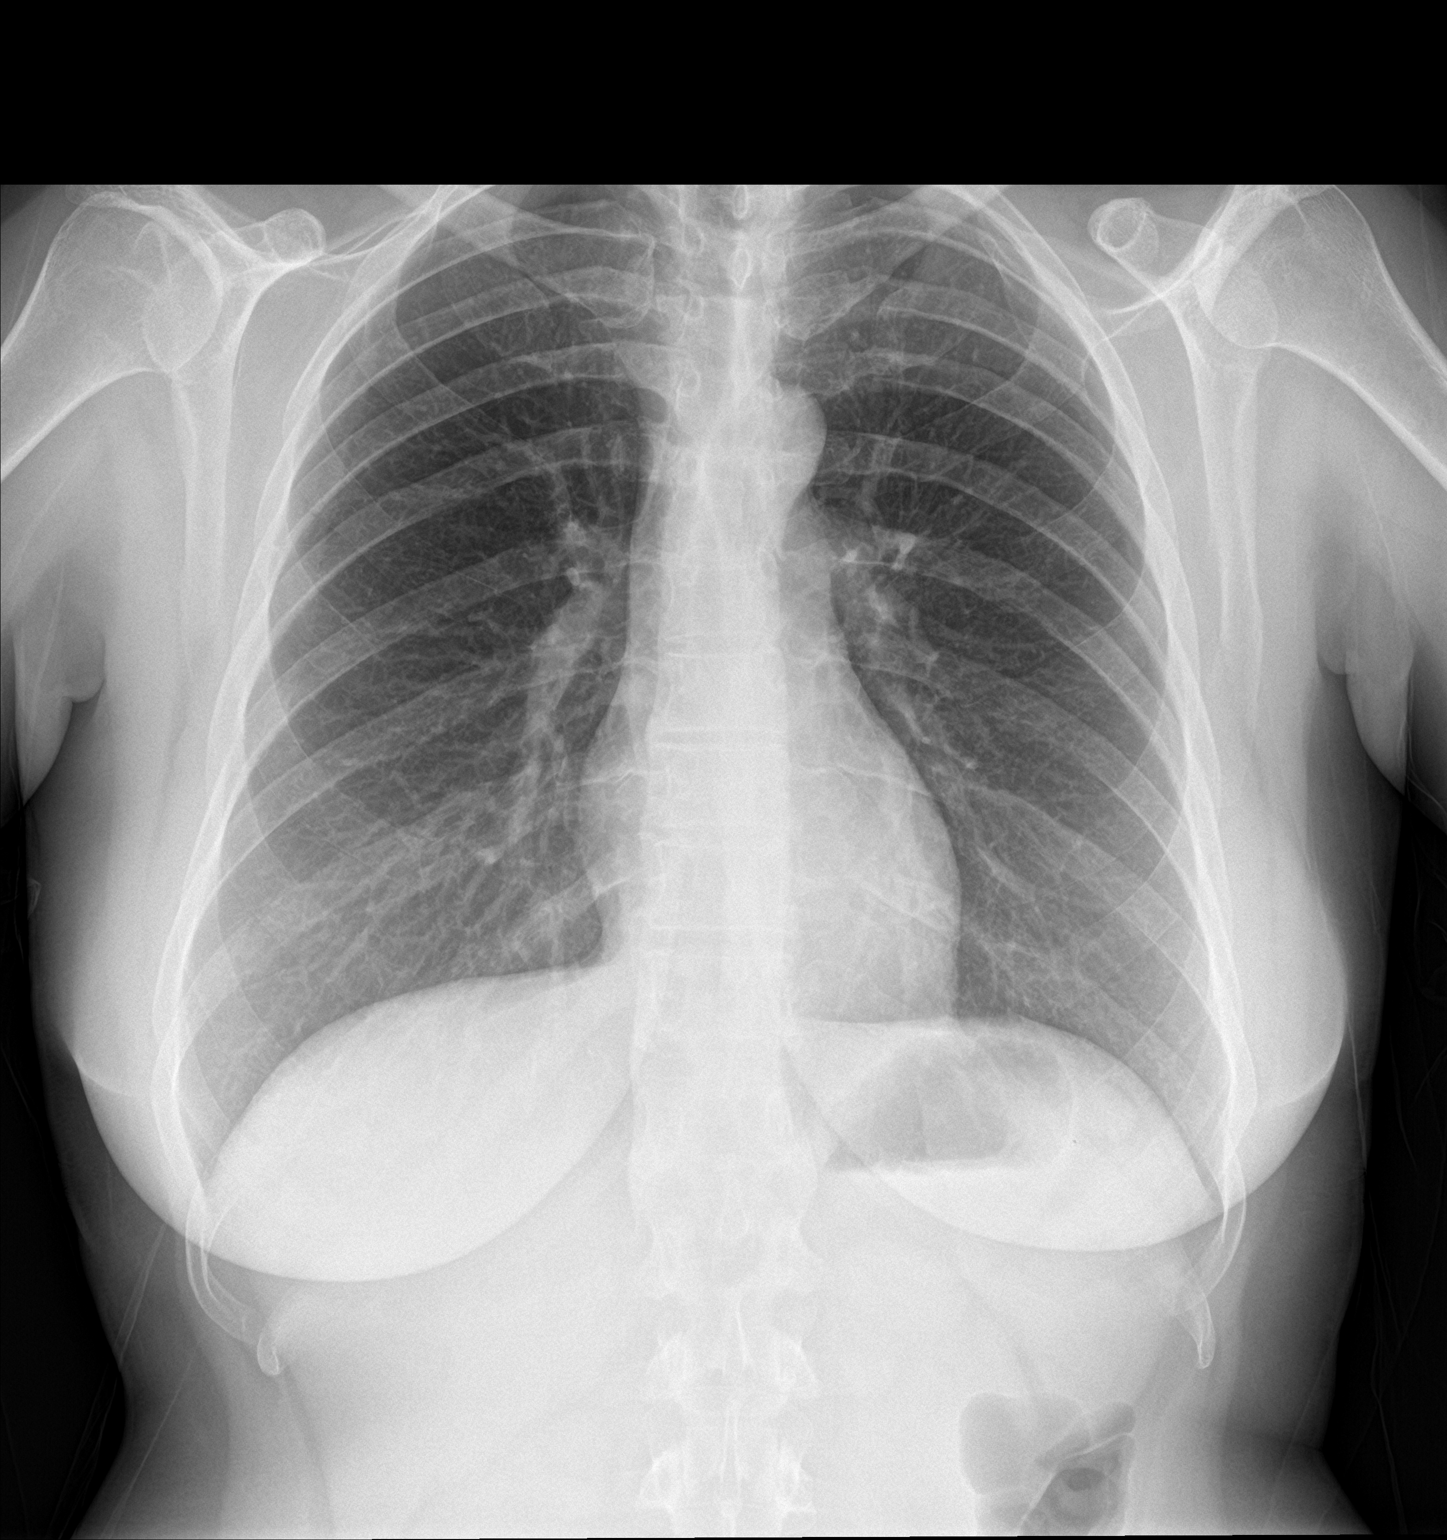

[chest lat]
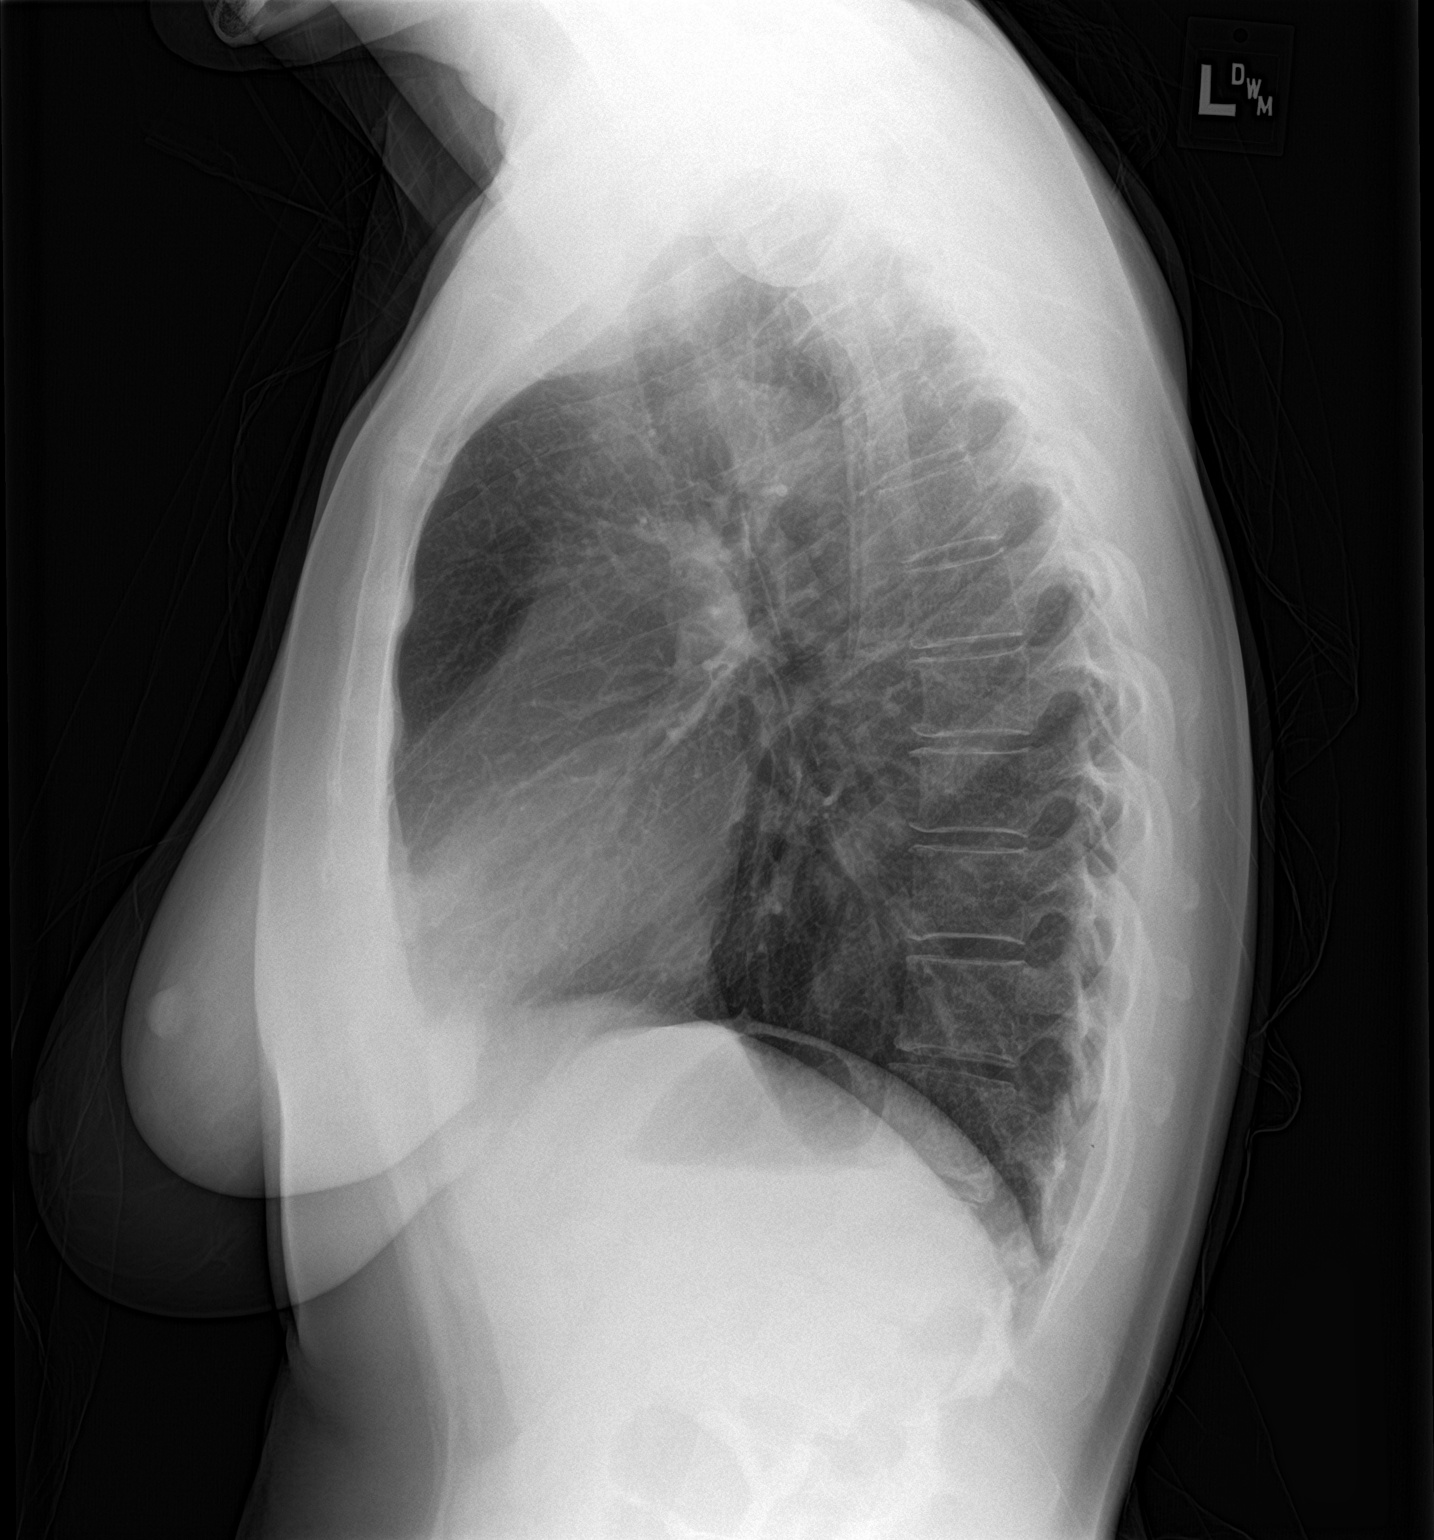

[2 of 2 positions shown; findings below may reference images not displayed]

FINDINGS: The heart size and mediastinal contours are within normal limits.
Both lungs are clear. No pneumothorax or pleural effusion is noted.
The visualized skeletal structures are unremarkable.
IMPRESSION: No active cardiopulmonary disease.

## 2016-01-13 ENCOUNTER — Ambulatory Visit (HOSPITAL_COMMUNITY)
Admission: EM | Admit: 2016-01-13 | Discharge: 2016-01-13 | Disposition: A | Discharge status: Home / Self Care | Payer: PRIVATE HEALTH INSURANCE | Attending: Family Medicine | Admitting: Family Medicine

## 2016-01-13 ENCOUNTER — Encounter (HOSPITAL_COMMUNITY): Payer: Self-pay | Admitting: *Deleted

## 2016-01-13 DIAGNOSIS — W503XXA Accidental bite by another person, initial encounter: Secondary | ICD-10-CM

## 2016-01-13 DIAGNOSIS — S61259A Open bite of unspecified finger without damage to nail, initial encounter: Secondary | ICD-10-CM | POA: Diagnosis not present

## 2016-01-13 HISTORY — DX: Unspecified osteoarthritis, unspecified site: M19.90

## 2016-01-13 MED ORDER — AMOXICILLIN-POT CLAVULANATE 875-125 MG PO TABS: 1.0000 | ORAL_TABLET | Freq: Two times a day (BID) | ORAL | Status: DC

## 2016-01-13 MED ORDER — TETANUS-DIPHTH-ACELL PERTUSSIS 5-2.5-18.5 LF-MCG/0.5 IM SUSP
INTRAMUSCULAR | Status: AC
  Filled 2016-01-13: qty 0.5

## 2016-01-13 MED ORDER — TETANUS-DIPHTH-ACELL PERTUSSIS 5-2.5-18.5 LF-MCG/0.5 IM SUSP
0.5000 mL | Freq: Once | INTRAMUSCULAR | Status: AC
  Administered 2016-01-13: 0.5 mL via INTRAMUSCULAR

## 2016-01-13 NOTE — Discharge Instructions (Signed)
Human Bite Human bite wounds tend to become infected, even when they seem minor at first. Infection can develop quickly, sometimes in a matter of hours. Bite wounds of the hand have a higher chance of infection compared to bites in other places and can be serious because the tendons and joints are close to the skin. SYMPTOMS  Common symptoms of a human bite include:   Bruising.   Broken skin.  Bleeding.  Pain. DIAGNOSIS  This condition may be diagnosed based on a physical exam and medical history. Your health care provider will examine the wound and ask for details about how the bite happened. If you have details about the medical history of the person who bit you, it is important to tell your health care provider. This will help determine if there is any chance that a disease may have been spread. You may have tests, such as:   Blood tests. This may be done if there is a chance of infection from diseases such as hepatitis or HIV.  X-rays to check for damage to bones or joints.  Culture test. This uses a sample of fluid from the wound to check for infection. TREATMENT  Treatment varies based on the location and severity of the bite and your medical history. Treatment may include:   Wound care. This often includes cleaning the wound, flushing the wound with saline solution, and applying a bandage (dressing). Sometimes, the wound is left open to heal because of the high risk of infection. However, in some cases, the wound may be closed with stitches (sutures), staples, skin glue, or adhesive strips.  Antibiotic medicine.  A flexible cast (splint).  Tetanus shot. In some cases, bites that have become infected may require IV antibiotics and surgical treatment in the hospital.  [place]  Follow instructions from your health care provider about how to take care of your wound. Make sure you:  Wash your hands with soap and water before you change your dressing.  If soap and water are not available, use hand sanitizer.  Change your dressing as told by your health care provider.  Leave sutures, skin glue, or adhesive strips in place. These skin closures may need to be in place for 2 weeks or longer. If adhesive strip edges start to loosen and curl up, you may trim the loose edges. Do not remove adhesive strips completely unless your health care provider tells you to do that.  Check your wound every day for signs of infection. Watch for:  Increasing redness, swelling, or pain.  Fluid, blood, or pus. General Instructions  Take or apply over-the-counter and prescription medicines only as told by your health care provider.  If you were prescribed an antibiotic, take or apply it as told by your health care provider. Do not stop using the antibiotic even if your condition improves.  Keep the injured area raised (elevated) above the level of your heart while you are sitting or lying down, if this is possible.  If directed, apply ice to the injured area:  Put ice in a plastic bag.  Place a towel between your skin and the bag.  Leave the ice on for 20 minutes, 2-3 times per day.  Keep all follow-up visits as told by your health care provider. This is important. SEEK MEDICAL CARE IF:  You have chills.   You have pain when you move your injured area.  You have trouble moving your injured area.   You are not  improving, or you are getting worse.  SEEK IMMEDIATE MEDICAL CARE IF:  You have increasing fluid, blood, or pus coming from your wound.  You have increasing redness, swelling, or pain at the site of your wound.   You have a red streak extending away from your wound.  You have a fever.    This information is not intended to replace advice given to you by your health care provider. Make sure you discuss any questions you have with your health care provider.   Document Released: 08/27/2004 Document Revised: 04/10/2015 Document  Reviewed: 12/05/2014 Elsevier Interactive Patient Education [employer].
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
## 2016-01-14 MED FILL — AMOX-CLAV 875-125 MG TABLET: 875-125 | 7 days supply | Qty: 14 | Fill #0

## 2016-01-16 DIAGNOSIS — G43001 Migraine without aura, not intractable, with status migrainosus: Secondary | ICD-10-CM | POA: Diagnosis not present

## 2016-01-16 MED FILL — SUMATRIPTAN SUCC 50 MG TAB: 50 | 30 days supply | Qty: 9 | Fill #0

## 2016-01-16 MED FILL — TOPIRAMATE 100 MG TABLET: 100 | 90 days supply | Qty: 180 | Fill #0

## 2016-01-16 MED FILL — AMITRIPTYLINE HCL 50 MG TAB: 50 | 90 days supply | Qty: 90 | Fill #0

## 2016-01-23 DIAGNOSIS — M79642 Pain in left hand: Secondary | ICD-10-CM | POA: Diagnosis not present

## 2016-01-23 DIAGNOSIS — M79641 Pain in right hand: Secondary | ICD-10-CM | POA: Diagnosis not present

## 2016-01-23 DIAGNOSIS — Z09 Encounter for follow-up examination after completed treatment for conditions other than malignant neoplasm: Secondary | ICD-10-CM | POA: Diagnosis not present

## 2016-01-23 DIAGNOSIS — M0579 Rheumatoid arthritis with rheumatoid factor of multiple sites without organ or systems involvement: Secondary | ICD-10-CM | POA: Diagnosis not present

## 2016-01-23 DIAGNOSIS — M79671 Pain in right foot: Secondary | ICD-10-CM | POA: Diagnosis not present

## 2016-01-23 DIAGNOSIS — M79672 Pain in left foot: Secondary | ICD-10-CM | POA: Diagnosis not present

## 2016-01-23 MED FILL — HYDROXYCHLOROQUINE 200 MG T: 200 | 30 days supply | Qty: 45 | Fill #0

## 2016-02-20 MED FILL — VENLAFAXINE HCL 75 MG TAB: 75 | 60 days supply | Qty: 60 | Fill #0

## 2016-02-20 MED FILL — HYDROXYCHLOROQUINE 200 MG T: 200 | 30 days supply | Qty: 45 | Fill #1

## 2016-02-25 DIAGNOSIS — M2550 Pain in unspecified joint: Secondary | ICD-10-CM | POA: Diagnosis not present

## 2016-02-25 DIAGNOSIS — R5381 Other malaise: Secondary | ICD-10-CM | POA: Diagnosis not present

## 2016-02-25 DIAGNOSIS — H524 Presbyopia: Secondary | ICD-10-CM | POA: Diagnosis not present

## 2016-02-25 DIAGNOSIS — Z113 Encounter for screening for infections with a predominantly sexual mode of transmission: Secondary | ICD-10-CM | POA: Diagnosis not present

## 2016-02-25 DIAGNOSIS — Z79899 Other long term (current) drug therapy: Secondary | ICD-10-CM | POA: Diagnosis not present

## 2016-03-05 DIAGNOSIS — R5383 Other fatigue: Secondary | ICD-10-CM | POA: Diagnosis not present

## 2016-03-05 DIAGNOSIS — M79641 Pain in right hand: Secondary | ICD-10-CM | POA: Diagnosis not present

## 2016-03-05 DIAGNOSIS — Z79899 Other long term (current) drug therapy: Secondary | ICD-10-CM | POA: Diagnosis not present

## 2016-03-05 DIAGNOSIS — L930 Discoid lupus erythematosus: Secondary | ICD-10-CM | POA: Diagnosis not present

## 2016-03-05 DIAGNOSIS — M0579 Rheumatoid arthritis with rheumatoid factor of multiple sites without organ or systems involvement: Secondary | ICD-10-CM | POA: Diagnosis not present

## 2016-03-05 MED FILL — METHOTREXATE 25 MG/ML VIAL: 50 | 28 days supply | Qty: 2 | Fill #0

## 2016-03-05 MED FILL — BD ALLERGY SYRINGE 1 ML 28G: 28G X 1/2" | 74 days supply | Qty: 10 | Fill #0

## 2016-03-05 MED FILL — FOLIC ACID 1 MG TABLET: 1 | 90 days supply | Qty: 180 | Fill #0

## 2016-03-11 DIAGNOSIS — M79642 Pain in left hand: Secondary | ICD-10-CM | POA: Diagnosis not present

## 2016-03-11 DIAGNOSIS — M79641 Pain in right hand: Secondary | ICD-10-CM | POA: Diagnosis not present

## 2016-03-16 DIAGNOSIS — Z79899 Other long term (current) drug therapy: Secondary | ICD-10-CM | POA: Diagnosis not present

## 2016-03-23 MED FILL — HYDROXYCHLOROQUINE 200 MG T: 200 | 30 days supply | Qty: 45 | Fill #2

## 2016-03-23 MED FILL — LEVOTHYROXINE 75 MCG TABLET: 75 | 90 days supply | Qty: 90 | Fill #1

## 2016-03-27 MED FILL — METHOTREXATE 25 MG/ML VIAL: 50 | 28 days supply | Qty: 4 | Fill #1

## 2016-03-30 DIAGNOSIS — Z79899 Other long term (current) drug therapy: Secondary | ICD-10-CM | POA: Diagnosis not present

## 2016-03-31 ENCOUNTER — Encounter: Payer: Self-pay | Admitting: *Deleted

## 2016-03-31 ENCOUNTER — Emergency Department
Admission: EM | Admit: 2016-03-31 | Discharge: 2016-03-31 | Disposition: A | Discharge status: Home / Self Care | Payer: 59 | Source: Home / Self Care | Attending: Family Medicine | Admitting: Family Medicine

## 2016-03-31 ENCOUNTER — Emergency Department (INDEPENDENT_AMBULATORY_CARE_PROVIDER_SITE_OTHER): Payer: 59

## 2016-03-31 DIAGNOSIS — S93601A Unspecified sprain of right foot, initial encounter: Secondary | ICD-10-CM

## 2016-03-31 DIAGNOSIS — M79671 Pain in right foot: Secondary | ICD-10-CM

## 2016-03-31 DIAGNOSIS — M7989 Other specified soft tissue disorders: Secondary | ICD-10-CM | POA: Diagnosis not present

## 2016-03-31 NOTE — ED Triage Notes (Signed)
Pt reports rolling her right foot walking yesterday. No previous injury. No otc meds @ home

## 2016-03-31 NOTE — ED Provider Notes (Signed)
[facility]    CSN: [id number] Arrival date & time: 03/31/16  1714  First Provider Contact:  None       History   Chief Complaint Chief Complaint  Patient presents with  . Foot Pain    HPI Courtney Leonard is a 50 y.o. female.   Patient "rolled" her right foot while walking yesterday, and has had persistent pain in the dorsum of her right foot.   The history is provided by the patient.  Foot Injury  Location:  Foot Time since incident:  1 day Injury: yes   Mechanism of injury comment:  Inverted right foot while walking Foot location:  Dorsum of R foot Pain details:    Quality:  Aching   Radiates to:  Does not radiate   Severity:  Moderate   Onset quality:  Sudden   Duration:  1 day   Timing:  Constant   Progression:  Unchanged Chronicity:  New Dislocation: no   Foreign body present:  No foreign bodies Prior injury to area:  No Relieved by:  Nothing Worsened by:  Bearing weight Ineffective treatments:  Ice Associated symptoms: decreased ROM, stiffness and swelling   Associated symptoms: no back pain, no muscle weakness, no numbness and no tingling     Past Medical History:  Diagnosis Date  . Arthritis   . Depression   . Migraine   . Thyroid disease     Patient Active Problem List   Diagnosis Date Noted  . ANXIETY 01/08/2011    Past Surgical History:  Procedure Laterality Date  . ABDOMINAL HYSTERECTOMY    . APPENDECTOMY     c-section  . CESAREAN SECTION    . KNEE SURGERY     RT knee  . laproscopy    . ROBOTIC ASSISTED SALPINGO OOPHERECTOMY Left 09/12/2014   Procedure: ROBOTIC ASSISTED Left SALPINGO OOPHORECTOMY/Right Salpingectomy/Pelvic Washings;  Surgeon: Princess Bruins, MD;  Location: [facility];  Service: Gynecology;  Laterality: Left;    OB History    No data available       Home Medications    Prior to Admission medications   Medication Sig Start Date End Date Taking? Authorizing Provider  levothyroxine (SYNTHROID,  LEVOTHROID) 75 MCG tablet Take 75 mcg by mouth daily before breakfast.    Historical Provider, MD  sertraline (ZOLOFT) 100 MG tablet Take 100 mg by mouth daily.    Historical Provider, MD  SUMAtriptan (IMITREX) 50 MG tablet Take 50 mg by mouth every 2 (two) hours as needed for migraine or headache. May repeat in 2 hours if headache persists or recurs.    Historical Provider, MD  topiramate (TOPAMAX) 100 MG tablet Take 100 mg by mouth 2 (two) times daily.    Historical Provider, MD    Family History Family History  Problem Relation Age of Onset  . Diabetes Mother   . Hypertension Mother   . Atrial fibrillation Father   . Prostate cancer Father     Social History Social History  Substance Use Topics  . Smoking status: Former Smoker    Types: Cigarettes    Quit date: 06/28/2001  . Smokeless tobacco: Never Used  . Alcohol use Yes     Comment: 1 q wk     Allergies   Sulfur   Review of Systems Review of Systems  Musculoskeletal: Positive for stiffness. Negative for back pain.  All other systems reviewed and are negative.    Physical Exam Triage Vital Signs ED Triage Vitals  Enc Vitals Group     BP 03/31/16 1728 112/75     Pulse Rate 03/31/16 1728 76     Resp --      Temp --      Temp src --      SpO2 03/31/16 1728 100 %     Weight 03/31/16 1728 152 lb (68.9 kg)     Height --      Head Circumference --      Peak Flow --      Pain Score 03/31/16 1730 3     Pain Loc --      Pain Edu? --      Excl. in [place]? --    No data found.   Updated Vital Signs BP 112/75 (BP Location: Left Arm)   Pulse 76   Wt 152 lb (68.9 kg)   SpO2 100%   BMI 26.09 kg/m   Visual Acuity Right Eye Distance:   Left Eye Distance:   Bilateral Distance:    Right Eye Near:   Left Eye Near:    Bilateral Near:     Physical Exam  Constitutional: She is oriented to person, place, and time. She appears well-developed and well-nourished. No distress.  HENT:  Head: Atraumatic.  Eyes:  Pupils are equal, round, and reactive to light.  Cardiovascular: Normal rate.   Pulmonary/Chest: Effort normal.  Musculoskeletal:       Right foot: There is decreased range of motion, tenderness and bony tenderness.       Feet:  Right dorsal mid-foot has localized tenderness to palpation as noted on diagram.  Distal neurovascular function is intact.   Neurological: She is alert and oriented to person, place, and time.  Skin: Skin is warm and dry.  Nursing note and vitals reviewed.    UC Treatments / Results  Labs (all labs ordered are listed, but only abnormal results are displayed) Labs Reviewed - No data to display  EKG  EKG Interpretation None       Radiology Dg Foot Complete Right  Result Date: 03/31/2016 CLINICAL DATA:  Rolled right foot yesterday, pain and swelling to lateral side since injury. EXAM: RIGHT FOOT COMPLETE - 3+ VIEW COMPARISON:  None. FINDINGS: Osseous alignment is normal. Bone mineralization is normal. No fracture line or displaced fracture fragment identified. Adjacent soft tissues are unremarkable. Incidental note made of a small enthesophyte along the undersurface of the posterior calcaneus. IMPRESSION: Negative. Electronically Signed   By: Franki Cabot M.D.   On: 03/31/2016 17:46    Procedures Procedures (including critical care time)  Medications Ordered in UC Medications - No data to display   Initial Impression / Assessment and Plan / UC Course  I have reviewed the triage vital signs and the nursing notes.  Pertinent labs & imaging results that were available during my care of the patient were reviewed by me and considered in my medical decision making (see chart for details).  Clinical Course  Ace wrap applied Apply ice pack for 30 minutes every 1 to 2 hours today and tomorrow.  Elevate.  Wear Ace wrap until swelling decreases.   Begin range of motion and stretching exercises in about 5 days as per instruction sheet.  May take Ibuprofen  200mg , 4 tabs every 8 hours with food for pain and swelling. Followup with Dr. Aundria Mems or Dr. Lynne Leader ([facility]) if not improving about two weeks.      Final Clinical Impressions(s) / UC Diagnoses  Final diagnoses:  Right foot sprain, initial encounter    New Prescriptions New Prescriptions   No medications on file     Kandra Nicolas, MD 04/12/16 1458

## 2016-03-31 NOTE — Discharge Instructions (Signed)
Apply ice pack for 30 minutes every 1 to 2 hours today and tomorrow.  Elevate.  Wear Ace wrap until swelling decreases.   Begin range of motion and stretching exercises in about 5 days as per instruction sheet.  May take Ibuprofen 200mg , 4 tabs every 8 hours with food for pain and swelling.

## 2016-04-01 MED FILL — VENLAFAXINE HCL 75 MG TAB: 75 | 30 days supply | Qty: 60 | Fill #0

## 2016-04-10 DIAGNOSIS — F419 Anxiety disorder, unspecified: Secondary | ICD-10-CM | POA: Diagnosis not present

## 2016-04-10 DIAGNOSIS — Z1239 Encounter for other screening for malignant neoplasm of breast: Secondary | ICD-10-CM | POA: Diagnosis not present

## 2016-04-10 DIAGNOSIS — N951 Menopausal and female climacteric states: Secondary | ICD-10-CM | POA: Diagnosis not present

## 2016-04-10 DIAGNOSIS — F5109 Other insomnia not due to a substance or known physiological condition: Secondary | ICD-10-CM | POA: Diagnosis not present

## 2016-04-10 MED FILL — VENLAFAXINE HCL ER 150 MG C: 150 | 90 days supply | Qty: 90 | Fill #0

## 2016-04-10 MED FILL — SUMATRIPTAN SUCC 50 MG TAB: 50 | 30 days supply | Qty: 9 | Fill #1

## 2016-04-10 MED FILL — traZODone HCL 50 MG TABS: 50 | 90 days supply | Qty: 90 | Fill #0

## 2016-04-20 DIAGNOSIS — Z79899 Other long term (current) drug therapy: Secondary | ICD-10-CM | POA: Diagnosis not present

## 2016-04-20 LAB — BASIC METABOLIC PANEL
BUN: 20 mg/dL (ref 4–21)
Creatinine: 0.8 mg/dL (ref 0.5–1.1)
GLUCOSE: 93 mg/dL
Potassium: 4 mmol/L (ref 3.4–5.3)
Sodium: 139 mmol/L (ref 137–147)

## 2016-04-20 LAB — HEPATIC FUNCTION PANEL
ALT: 12 U/L (ref 7–35)
AST: 12 U/L — AB (ref 13–35)
Alkaline Phosphatase: 71 U/L (ref 25–125)
BILIRUBIN, TOTAL: 0.2 mg/dL

## 2016-04-20 LAB — CBC AND DIFFERENTIAL
HEMATOCRIT: 37 % (ref 36–46)
HEMOGLOBIN: 12.2 g/dL (ref 12.0–16.0)
PLATELETS: 217 10*3/uL (ref 150–399)
WBC: 4.2 10*3/mL

## 2016-04-20 MED FILL — HYDROXYCHLOROQUINE 200 MG T: 200 | 90 days supply | Qty: 135 | Fill #0

## 2016-04-21 MED FILL — METHOTREXATE 25 MG/ML VIAL: 50 | 28 days supply | Qty: 4 | Fill #2

## 2016-04-21 MED FILL — AMITRIPTYLINE HCL 50 MG TAB: 50 | 90 days supply | Qty: 90 | Fill #1

## 2016-04-21 MED FILL — TOPIRAMATE 100 MG TABLET: 100 | 90 days supply | Qty: 180 | Fill #1

## 2016-04-30 MED FILL — ALPRAZolam 0.5 MG TABS: 0.5 | 30 days supply | Qty: 30 | Fill #0

## 2016-05-09 ENCOUNTER — Other Ambulatory Visit: Payer: Self-pay | Admitting: Radiology

## 2016-05-09 DIAGNOSIS — Z79899 Other long term (current) drug therapy: Secondary | ICD-10-CM

## 2016-06-04 ENCOUNTER — Other Ambulatory Visit: Payer: Self-pay | Admitting: Rheumatology

## 2016-06-04 MED FILL — FOLIC ACID 1 MG TABLET: 1 | 90 days supply | Qty: 180 | Fill #1

## 2016-06-05 MED FILL — METHOTREXATE 25 MG/ML VIAL: 50 | 84 days supply | Qty: 10 | Fill #0

## 2016-06-05 NOTE — Telephone Encounter (Signed)
Last visit 03/11/16 Next visit 06/16/16 Labs WNL 04/21/16 Ok to refill per Dr Estanislado Pandy

## 2016-06-13 DIAGNOSIS — G4700 Insomnia, unspecified: Secondary | ICD-10-CM | POA: Insufficient documentation

## 2016-06-13 DIAGNOSIS — Z79899 Other long term (current) drug therapy: Secondary | ICD-10-CM | POA: Insufficient documentation

## 2016-06-13 DIAGNOSIS — G43909 Migraine, unspecified, not intractable, without status migrainosus: Secondary | ICD-10-CM | POA: Insufficient documentation

## 2016-06-13 DIAGNOSIS — E039 Hypothyroidism, unspecified: Secondary | ICD-10-CM | POA: Insufficient documentation

## 2016-06-13 DIAGNOSIS — M0579 Rheumatoid arthritis with rheumatoid factor of multiple sites without organ or systems involvement: Secondary | ICD-10-CM | POA: Insufficient documentation

## 2016-06-13 DIAGNOSIS — M19071 Primary osteoarthritis, right ankle and foot: Secondary | ICD-10-CM | POA: Insufficient documentation

## 2016-06-13 DIAGNOSIS — M19072 Primary osteoarthritis, left ankle and foot: Secondary | ICD-10-CM

## 2016-06-13 DIAGNOSIS — Z87891 Personal history of nicotine dependence: Secondary | ICD-10-CM | POA: Insufficient documentation

## 2016-06-13 NOTE — Progress Notes (Signed)
Office Visit Note  Patient: Courtney Leonard             Date of Birth: 10-31-1965           MRN: [id number]             PCP: Deborah Chalk, FNP Referring: Deborah Chalk, FNP Visit Date: 06/16/2016 Occupation: RN on rehabilitation floor    Subjective: Pain in hands   History of Present Illness: Courtney Leonard is a right-handed 50 y.o. female with history of rheumatoid arthritis. She states she continues to have some pain and stiffness in her hands, shoulders and neck, bilateral feet, ankles and knees. She denies any joint swelling. She states the morning stiffness lasts for an hour. She's been taking methotrexate since the last visit she states she is tolerating it well she has some queasiness after the dose but it gets better after she drinks ginger ale.   Activities of Daily Living:  Patient reports morning stiffness for 1 hour.   Patient Denies nocturnal pain.  Difficulty dressing/grooming: Denies Difficulty climbing stairs: Denies Difficulty getting out of chair: Denies Difficulty using hands for taps, buttons, cutlery, and/or writing: Denies   Review of Systems  Constitutional: Positive for fatigue. Negative for night sweats, weight gain, weight loss and weakness.  HENT: Negative for mouth sores, trouble swallowing, trouble swallowing, mouth dryness and nose dryness.   Eyes: Negative for pain, redness, visual disturbance and dryness.  Respiratory: Negative for cough, shortness of breath and difficulty breathing.   Cardiovascular: Negative for chest pain, palpitations, hypertension, irregular heartbeat and swelling in legs/feet.  Gastrointestinal: Negative for blood in stool, constipation and diarrhea.  Endocrine: Negative for increased urination.  Genitourinary: Negative for vaginal dryness.  Musculoskeletal: Positive for arthralgias, joint pain and morning stiffness. Negative for joint swelling, myalgias, muscle weakness, muscle tenderness and myalgias.  Skin:  Negative for color change, rash, hair loss, skin tightness, ulcers and sensitivity to sunlight.  Allergic/Immunologic: Negative for susceptible to infections.  Neurological: Negative for dizziness, memory loss and night sweats.  Hematological: Negative for swollen glands.  Psychiatric/Behavioral: Negative for depressed mood and sleep disturbance. The patient is not nervous/anxious.     PMFS History:  Patient Active Problem List   Diagnosis Date Noted  . Rheumatoid arthritis involving multiple sites with positive rheumatoid factor ([place]) 06/13/2016  . High risk medication use 06/13/2016  . Primary osteoarthritis of both feet 06/13/2016  . Insomnia 06/13/2016  . Acquired hypothyroidism 06/13/2016  . Migraine without status migrainosus, not intractable 06/13/2016  . Former smoker 06/13/2016  . Anxiety 01/08/2011    Past Medical History:  Diagnosis Date  . Anal fissure   . Anxiety   . Arthritis   . Depression   . Hypothyroidism   . Insomnia   . Migraine   . Osteoarthritis   . Rheumatoid arthritis ([place])   . Thyroid disease     Family History  Problem Relation Age of Onset  . Diabetes Mother   . Hypertension Mother   . Atrial fibrillation Father   . Prostate cancer Father    Past Surgical History:  Procedure Laterality Date  . ABDOMINAL HYSTERECTOMY    . APPENDECTOMY     c-section  . CESAREAN SECTION    . HEMORRHOID SURGERY    . KNEE SURGERY     RT knee  . laproscopy    . ROBOTIC ASSISTED SALPINGO OOPHERECTOMY Left 09/12/2014   Procedure: ROBOTIC ASSISTED Left SALPINGO OOPHORECTOMY/Right Salpingectomy/Pelvic Washings;  Surgeon:  Princess Bruins, MD;  Location: [facility];  Service: Gynecology;  Laterality: Left;   Social History   Social History Narrative  . No narrative on file     Objective: Vital Signs: BP (!) 89/60 (BP Location: Left Arm, Patient Position: Sitting, Cuff Size: Large)   Pulse 84   Resp 12   Ht 5\' 4"  (1.626 m)   Wt 153 lb (69.4 kg)   BMI 26.26  kg/m    Physical Exam  Constitutional: She is oriented to person, place, and time. She appears well-developed and well-nourished.  HENT:  Head: Normocephalic and atraumatic.  Eyes: Conjunctivae and EOM are normal.  Neck: Normal range of motion.  Cardiovascular: Normal rate, regular rhythm, normal heart sounds and intact distal pulses.   Pulmonary/Chest: Effort normal and breath sounds normal.  Abdominal: Soft. Bowel sounds are normal.  Lymphadenopathy:    She has no cervical adenopathy.  Neurological: She is alert and oriented to person, place, and time.  Skin: Skin is warm and dry. Capillary refill takes less than 2 seconds.  Psychiatric: She has a normal mood and affect. Her behavior is normal.  Nursing note and vitals reviewed.    Musculoskeletal Exam: C-spine, thoracic, lumbar spine good range of motion with no SI joint tenderness. Shoulder joints, elbow joints, wrist joints, MCPs, PIPs, DIPs with good range of motion with no synovitis or tenderness. Hip joints, knee joints, ankle joints, MTPs PIPs DIPs with range of motion with no synovitis.  CDAI Exam: CDAI Homunculus Exam:   Joint Counts:  CDAI Tender Joint count: 0 CDAI Swollen Joint count: 0  Global Assessments:  Patient Global Assessment: 4 Provider Global Assessment: 2  CDAI Calculated Score: 6    Investigation: Findings:  July 2017 TB negative, hepatitis negative, SPEP negative, immunoglobulins negative, HIV negative, hepatitis B immunization 2011, 04/20/2016 CMP normal CBC normal    Imaging: No results found.  Speciality Comments: No specialty comments available.    Procedures:  No procedures performed Allergies: Sulfur   Assessment / Plan: Visit Diagnoses: Rheumatoid arthritis involving multiple sites with positive rheumatoid factor ([place]) - she continues to have some arthralgias but has no synovitis on examination. +RF80,-CCP  High risk medication use - she is on Methotrexate 0.8 ML  subcutaneously per week, Plaquenil 200 mg a.m., 123XX123 mg p.m., folic acid 2 mg a day -and tolerating these well Plan: COMPLETE METABOLIC PANEL WITH GFR, CBC with Differential/Platelet, CBC with Differential/Platelet, COMPLETE METABOLIC PANEL WITH GFR will be obtained today and then every 3 months to monitor for drug toxicity. She states she had her eye exam this year.  Primary osteoarthritis of both feet: Mild stiffness.  She has following medical problems which are followed up by her PCP. :  Insomnia, unspecified type  Acquired hypothyroidism  Anxiety  Migraine without status migrainosus, not intractable, unspecified migraine type  Former smoker : Smoking cessation was discussed. Orders: Orders Placed This Encounter  Procedures  . COMPLETE METABOLIC PANEL WITH GFR  . CBC with Differential/Platelet  . CBC with Differential/Platelet  . COMPLETE METABOLIC PANEL WITH GFR   No orders of the defined types were placed in this encounter.   Face-to-face time spent with patient was 30 minutes. 50% of time was spent in counseling and coordination of care.  Follow-Up Instructions: Return in about 5 years (around 06/16/2021) for Rheumatoid arthritis.   Bo Merino, MD

## 2016-06-15 ENCOUNTER — Encounter: Payer: Self-pay | Admitting: *Deleted

## 2016-06-16 ENCOUNTER — Ambulatory Visit (INDEPENDENT_AMBULATORY_CARE_PROVIDER_SITE_OTHER): Payer: 59 | Admitting: Rheumatology

## 2016-06-16 ENCOUNTER — Telehealth: Payer: Self-pay | Admitting: Rheumatology

## 2016-06-16 ENCOUNTER — Encounter: Payer: Self-pay | Admitting: Rheumatology

## 2016-06-16 VITALS — BP 89/60 | HR 84 | Resp 12 | Ht 64.0 in | Wt 153.0 lb

## 2016-06-16 DIAGNOSIS — E039 Hypothyroidism, unspecified: Secondary | ICD-10-CM | POA: Diagnosis not present

## 2016-06-16 DIAGNOSIS — F419 Anxiety disorder, unspecified: Secondary | ICD-10-CM | POA: Diagnosis not present

## 2016-06-16 DIAGNOSIS — Z87891 Personal history of nicotine dependence: Secondary | ICD-10-CM | POA: Diagnosis not present

## 2016-06-16 DIAGNOSIS — M19071 Primary osteoarthritis, right ankle and foot: Secondary | ICD-10-CM

## 2016-06-16 DIAGNOSIS — M0579 Rheumatoid arthritis with rheumatoid factor of multiple sites without organ or systems involvement: Secondary | ICD-10-CM | POA: Diagnosis not present

## 2016-06-16 DIAGNOSIS — G43909 Migraine, unspecified, not intractable, without status migrainosus: Secondary | ICD-10-CM

## 2016-06-16 DIAGNOSIS — G4700 Insomnia, unspecified: Secondary | ICD-10-CM

## 2016-06-16 DIAGNOSIS — Z79899 Other long term (current) drug therapy: Secondary | ICD-10-CM

## 2016-06-16 DIAGNOSIS — M19072 Primary osteoarthritis, left ankle and foot: Secondary | ICD-10-CM | POA: Diagnosis not present

## 2016-06-16 LAB — COMPLETE METABOLIC PANEL WITH GFR
ALK PHOS: 69 U/L (ref 33–115)
ALT: 10 U/L (ref 6–29)
AST: 14 U/L (ref 10–35)
Albumin: 4.3 g/dL (ref 3.6–5.1)
BILIRUBIN TOTAL: 0.4 mg/dL (ref 0.2–1.2)
BUN: 16 mg/dL (ref 7–25)
CO2: 26 mmol/L (ref 20–31)
Calcium: 8.9 mg/dL (ref 8.6–10.2)
Chloride: 107 mmol/L (ref 98–110)
Creat: 0.93 mg/dL (ref 0.50–1.10)
GFR, EST AFRICAN AMERICAN: 83 mL/min (ref >=60)
GFR, EST NON AFRICAN AMERICAN: 72 mL/min (ref >=60)
Glucose, Bld: 82 mg/dL (ref 65–99)
POTASSIUM: 4.1 mmol/L (ref 3.5–5.3)
SODIUM: 139 mmol/L (ref 135–146)
TOTAL PROTEIN: 6.5 g/dL (ref 6.1–8.1)

## 2016-06-16 LAB — CBC WITH DIFFERENTIAL/PLATELET
BASOS ABS: 39 {cells}/uL (ref 0–200)
Basophils Relative: 1 %
EOS ABS: 39 {cells}/uL (ref 15–500)
EOS PCT: 1 %
HCT: 37.2 % (ref 35.0–45.0)
Hemoglobin: 12.3 g/dL (ref 11.7–15.5)
LYMPHS PCT: 26 %
Lymphs Abs: 1014 cells/uL (ref 850–3900)
MCH: 32.5 pg (ref 27.0–33.0)
MCHC: 33.1 g/dL (ref 32.0–36.0)
MCV: 98.2 fL (ref 80.0–100.0)
MONOS PCT: 7 %
MPV: 9.8 fL (ref 7.5–12.5)
Monocytes Absolute: 273 cells/uL (ref 200–950)
NEUTROS PCT: 65 %
Neutro Abs: 2535 cells/uL (ref 1500–7800)
PLATELETS: 215 10*3/uL (ref 140–400)
RBC: 3.79 MIL/uL — ABNORMAL LOW (ref 3.80–5.10)
RDW: 14.3 % (ref 11.0–15.0)
WBC: 3.9 10*3/uL (ref 3.8–10.8)

## 2016-06-16 NOTE — Telephone Encounter (Signed)
Once patient checks her work schedule she will call back to schedule BIL hand ultrasound.

## 2016-06-16 NOTE — Patient Instructions (Signed)
Standing Labs We placed an order today for your standing lab work.    Please come back and get your standing labs in February 2018 and every 3 months.  We have open lab Monday through Friday from 8:30-11:30 AM and 1-4 PM at the office of Dr. Tresa Moore, PA.   The office is located at [address] [place], [place], [place] [address] No appointment is necessary.   Labs are drawn by [facility].  You may receive a bill from [facility] for your lab work.

## 2016-06-16 NOTE — Progress Notes (Signed)
Rheumatology Medication Review by a Pharmacist Does the patient feel that his/her medications are working for him/her?  Yes Has the patient been experiencing any side effects to the medications prescribed?  No Does the patient have any problems obtaining medications?  No  Issues to address at subsequent visits: None   Pharmacist comments:  Mrs. Cruse is a pleasant 50 yo female presenting for follow up on rheumatoid arthritis.  Patient has good understanding of her rheumatoid arthritis regimen and reports good compliance with medications.  Patient last had labs on 04/20/16, and reports she is due for her standing labs.  Patient did not have any medication-related questions or concerns for me at this time.    Elisabeth Most, Pharm.D., BCPS Clinical Pharmacist Pager: [phone] Phone: [phone] 06/16/2016 10:38 AM

## 2016-06-17 NOTE — Progress Notes (Signed)
Labs normal.

## 2016-07-01 ENCOUNTER — Ambulatory Visit: Payer: 59 | Admitting: Rheumatology

## 2016-07-03 MED FILL — BD ALLERGY SYRINGE 1 ML 28G: 28G X 1/2" | 74 days supply | Qty: 10 | Fill #1

## 2016-07-03 MED FILL — LEVOTHYROXINE 75 MCG TABLET: 75 | 90 days supply | Qty: 90 | Fill #2

## 2016-07-07 MED FILL — SUMATRIPTAN SUCC 50 MG TAB: 50 | 30 days supply | Qty: 9 | Fill #2

## 2016-08-04 MED FILL — traZODone HCL 50 MG TABS: 50 | 90 days supply | Qty: 90 | Fill #1

## 2016-08-04 MED FILL — VENLAFAXINE HCL ER 150 MG C: 150 | 90 days supply | Qty: 90 | Fill #1

## 2016-08-04 MED FILL — HYDROXYCHLOROQUINE 200 MG T: 200 | 90 days supply | Qty: 135 | Fill #1

## 2016-08-05 MED FILL — AMITRIPTYLINE HCL 50 MG TAB: 50 | 90 days supply | Qty: 90 | Fill #0

## 2016-08-05 MED FILL — TOPIRAMATE 100 MG TABLET: 100 | 90 days supply | Qty: 180 | Fill #0

## 2016-09-01 DIAGNOSIS — G4701 Insomnia due to medical condition: Secondary | ICD-10-CM | POA: Diagnosis not present

## 2016-09-01 DIAGNOSIS — G43709 Chronic migraine without aura, not intractable, without status migrainosus: Secondary | ICD-10-CM | POA: Diagnosis not present

## 2016-09-01 MED FILL — GABAPENTIN 300 MG CAPSULE: 300 | 30 days supply | Qty: 60 | Fill #0

## 2016-09-03 MED FILL — FOLIC ACID 1 MG TABLET: 1 | 90 days supply | Qty: 180 | Fill #2

## 2016-09-10 ENCOUNTER — Other Ambulatory Visit: Payer: Self-pay | Admitting: Rheumatology

## 2016-09-10 MED FILL — BD ALLERGY SYRINGE 1 ML 28G: 28G X 1/2" | 74 days supply | Qty: 10 | Fill #2

## 2016-09-11 MED FILL — METHOTREXATE 25 MG/ML VIAL: 50 | 88 days supply | Qty: 10 | Fill #0

## 2016-09-11 NOTE — Telephone Encounter (Signed)
Last Visit: 06/16/16 Next Visit: 11/16/16 Labs: 06/16/16 WNL  Okay to refill MTX?

## 2016-11-04 ENCOUNTER — Other Ambulatory Visit: Payer: Self-pay | Admitting: Rheumatology

## 2016-11-04 MED FILL — VENLAFAXINE HCL ER 150 MG C: 150 | 60 days supply | Qty: 60 | Fill #0

## 2016-11-04 MED FILL — traZODone HCL 50 MG TABS: 50 | 90 days supply | Qty: 90 | Fill #1

## 2016-11-04 MED FILL — TOPIRAMATE 100 MG TABLET: 100 | 90 days supply | Qty: 180 | Fill #1

## 2016-11-04 MED FILL — LEVOTHYROXINE 75 MCG TABLET: 75 | 90 days supply | Qty: 90 | Fill #3

## 2016-11-04 MED FILL — AMITRIPTYLINE HCL 50 MG TAB: 50 | 90 days supply | Qty: 90 | Fill #1

## 2016-11-04 NOTE — Telephone Encounter (Signed)
ok 

## 2016-11-04 NOTE — Telephone Encounter (Signed)
Last Visit: 06/16/16 Next Visit: 11/16/16 Labs: 06/16/16 WNL PLQ Eye Exam: 03/15/16 WNL  Okay to refill PLQ?

## 2016-11-05 MED FILL — HYDROXYCHLOROQUINE 200 MG T: 200 | 90 days supply | Qty: 135 | Fill #0

## 2016-11-09 NOTE — Progress Notes (Deleted)
Office Visit Note  Patient: Courtney Leonard             Date of Birth: 1966/06/19           MRN: [id number]             PCP: Deborah Chalk, FNP Referring: Deborah Chalk, FNP Visit Date: 11/16/2016 Occupation: '@GUAROCC' @    Subjective:  No chief complaint on file.   History of Present Illness: Courtney Leonard is a 51 y.o. female ***   Activities of Daily Living:  Patient reports morning stiffness for *** {minute/hour:19697}.   Patient {ACTIONS;DENIES/REPORTS:21021675::"Denies"} nocturnal pain.  Difficulty dressing/grooming: {ACTIONS;DENIES/REPORTS:21021675::"Denies"} Difficulty climbing stairs: {ACTIONS;DENIES/REPORTS:21021675::"Denies"} Difficulty getting out of chair: {ACTIONS;DENIES/REPORTS:21021675::"Denies"} Difficulty using hands for taps, buttons, cutlery, and/or writing: {ACTIONS;DENIES/REPORTS:21021675::"Denies"}   No Rheumatology ROS completed.   PMFS History:  Patient Active Problem List   Diagnosis Date Noted  . Rheumatoid arthritis involving multiple sites with positive rheumatoid factor ([place]) 06/13/2016  . High risk medication use 06/13/2016  . Primary osteoarthritis of both feet 06/13/2016  . Insomnia 06/13/2016  . Acquired hypothyroidism 06/13/2016  . Migraine without status migrainosus, not intractable 06/13/2016  . Former smoker 06/13/2016  . Anxiety 01/08/2011    Past Medical History:  Diagnosis Date  . Anal fissure   . Anxiety   . Arthritis   . Depression   . Hypothyroidism   . Insomnia   . Migraine   . Osteoarthritis   . Rheumatoid arthritis ([place])   . Thyroid disease     Family History  Problem Relation Age of Onset  . Diabetes Mother   . Hypertension Mother   . Atrial fibrillation Father   . Prostate cancer Father    Past Surgical History:  Procedure Laterality Date  . ABDOMINAL HYSTERECTOMY    . APPENDECTOMY     c-section  . CESAREAN SECTION    . HEMORRHOID SURGERY    . KNEE SURGERY     RT knee  . laproscopy    .  ROBOTIC ASSISTED SALPINGO OOPHERECTOMY Left 09/12/2014   Procedure: ROBOTIC ASSISTED Left SALPINGO OOPHORECTOMY/Right Salpingectomy/Pelvic Washings;  Surgeon: Princess Bruins, MD;  Location: [facility];  Service: Gynecology;  Laterality: Left;   Social History   Social History Narrative  . No narrative on file     Objective: Vital Signs: There were no vitals taken for this visit.   Physical Exam   Musculoskeletal Exam: ***  CDAI Exam: No CDAI exam completed.    Investigation: Findings:  06/16/2016 CBC normal, CMP normal  No visits with results within 3 Month(s) from this visit.  Latest known visit with results is:  Office Visit on 06/16/2016  Component Date Value Ref Range Status  . WBC 06/16/2016 3.9  3.8 - 10.8 K/uL Final  . RBC 06/16/2016 3.79* 3.80 - 5.10 MIL/uL Final  . Hemoglobin 06/16/2016 12.3  11.7 - 15.5 g/dL Final  . HCT 06/16/2016 37.2  35.0 - 45.0 % Final  . MCV 06/16/2016 98.2  80.0 - 100.0 fL Final  . MCH 06/16/2016 32.5  27.0 - 33.0 pg Final  . MCHC 06/16/2016 33.1  32.0 - 36.0 g/dL Final  . RDW 06/16/2016 14.3  11.0 - 15.0 % Final  . Platelets 06/16/2016 215  140 - 400 K/uL Final  . MPV 06/16/2016 9.8  7.5 - 12.5 fL Final  . Neutro Abs 06/16/2016 2535  1,500 - 7,800 cells/uL Final  . Lymphs Abs 06/16/2016 1014  850 - 3,900 cells/uL Final  .  Monocytes Absolute 06/16/2016 273  200 - 950 cells/uL Final  . Eosinophils Absolute 06/16/2016 39  15 - 500 cells/uL Final  . Basophils Absolute 06/16/2016 39  0 - 200 cells/uL Final  . Neutrophils Relative % 06/16/2016 65  % Final  . Lymphocytes Relative 06/16/2016 26  % Final  . Monocytes Relative 06/16/2016 7  % Final  . Eosinophils Relative 06/16/2016 1  % Final  . Basophils Relative 06/16/2016 1  % Final  . Smear Review 06/16/2016 Criteria for review not met   Final  . Sodium 06/16/2016 139  135 - 146 mmol/L Final  . Potassium 06/16/2016 4.1  3.5 - 5.3 mmol/L Final  . Chloride 06/16/2016 107  98 - 110 mmol/L  Final  . CO2 06/16/2016 26  20 - 31 mmol/L Final  . Glucose, Bld 06/16/2016 82  65 - 99 mg/dL Final  . BUN 06/16/2016 16  7 - 25 mg/dL Final  . Creat 06/16/2016 0.93  0.50 - 1.10 mg/dL Final  . Total Bilirubin 06/16/2016 0.4  0.2 - 1.2 mg/dL Final  . Alkaline Phosphatase 06/16/2016 69  33 - 115 U/L Final  . AST 06/16/2016 14  10 - 35 U/L Final  . ALT 06/16/2016 10  6 - 29 U/L Final  . Total Protein 06/16/2016 6.5  6.1 - 8.1 g/dL Final  . Albumin 06/16/2016 4.3  3.6 - 5.1 g/dL Final  . Calcium 06/16/2016 8.9  8.6 - 10.2 mg/dL Final  . GFR, Est African American 06/16/2016 83  >=60 mL/min Final  . GFR, Est Non African American 06/16/2016 72  >=60 mL/min Final     Imaging: No results found.  Speciality Comments: No specialty comments available.    Procedures:  No procedures performed Allergies: Sulfur   Assessment / Plan:     Visit Diagnoses: Rheumatoid arthritis involving multiple sites with positive rheumatoid factor (H[facility])  High risk medication use  Primary insomnia  Primary osteoarthritis of both feet  Former smoker  History of migraine  History of hypothyroidism  History of anxiety   She also has hypothyroidism, migraines, and anxiety on her problem list.   Orders: No orders of the defined types were placed in this encounter.  No orders of the defined types were placed in this encounter.   Face-to-face time spent with patient was *** minutes. 50% of time was spent in counseling and coordination of care.  Follow-Up Instructions: No Follow-up on file.   Bo Merino, MD  Note - This record has been created using Editor, commissioning.  Chart creation errors have been sought, but may not always  have been located. Such creation errors do not reflect on  the standard of medical care.

## 2016-11-16 ENCOUNTER — Ambulatory Visit: Payer: 59 | Admitting: Rheumatology

## 2016-11-27 ENCOUNTER — Telehealth: Payer: Self-pay | Admitting: Rheumatology

## 2016-11-27 NOTE — Telephone Encounter (Signed)
She has not had labs since Nov, what is her lab plan? I  Have called her to inquire. Left message for her to call me back and let me know

## 2016-11-27 NOTE — Telephone Encounter (Signed)
Patient scheduled a follow up appt. For June 1st. Patient needs new RX for MTX. Patient uses [facility]. Patient has enough for this weekend. Please call when called in.

## 2016-12-01 NOTE — Telephone Encounter (Signed)
No reply on phone message have sent a my chart message.

## 2016-12-03 NOTE — Telephone Encounter (Signed)
Spoke with patient and she will come for labs with 12/04/16 or 12/07/16.

## 2016-12-07 ENCOUNTER — Other Ambulatory Visit: Payer: Self-pay

## 2016-12-07 DIAGNOSIS — Z79899 Other long term (current) drug therapy: Secondary | ICD-10-CM

## 2016-12-07 LAB — CBC WITH DIFFERENTIAL/PLATELET
BASOS PCT: 1 %
Basophils Absolute: 38 cells/uL (ref 0–200)
EOS ABS: 38 {cells}/uL (ref 15–500)
Eosinophils Relative: 1 %
HCT: 34 % — ABNORMAL LOW (ref 35.0–45.0)
Hemoglobin: 11.4 g/dL — ABNORMAL LOW (ref 11.7–15.5)
LYMPHS PCT: 37 %
Lymphs Abs: 1406 cells/uL (ref 850–3900)
MCH: 32.9 pg (ref 27.0–33.0)
MCHC: 33.5 g/dL (ref 32.0–36.0)
MCV: 98 fL (ref 80.0–100.0)
MONO ABS: 228 {cells}/uL (ref 200–950)
MONOS PCT: 6 %
MPV: 9.6 fL (ref 7.5–12.5)
NEUTROS ABS: 2090 {cells}/uL (ref 1500–7800)
Neutrophils Relative %: 55 %
PLATELETS: 222 10*3/uL (ref 140–400)
RBC: 3.47 MIL/uL — ABNORMAL LOW (ref 3.80–5.10)
RDW: 14.3 % (ref 11.0–15.0)
WBC: 3.8 10*3/uL (ref 3.8–10.8)

## 2016-12-08 ENCOUNTER — Other Ambulatory Visit: Payer: Self-pay | Admitting: *Deleted

## 2016-12-08 LAB — COMPLETE METABOLIC PANEL WITH GFR
ALT: 12 U/L (ref 6–29)
AST: 15 U/L (ref 10–35)
Albumin: 4 g/dL (ref 3.6–5.1)
Alkaline Phosphatase: 56 U/L (ref 33–115)
BILIRUBIN TOTAL: 0.2 mg/dL (ref 0.2–1.2)
BUN: 12 mg/dL (ref 7–25)
CALCIUM: 8.6 mg/dL (ref 8.6–10.2)
CHLORIDE: 107 mmol/L (ref 98–110)
CO2: 24 mmol/L (ref 20–31)
Creat: 0.92 mg/dL (ref 0.50–1.10)
GFR, EST AFRICAN AMERICAN: 85 mL/min (ref >=60)
GFR, EST NON AFRICAN AMERICAN: 73 mL/min (ref >=60)
Glucose, Bld: 80 mg/dL (ref 65–99)
Potassium: 3.8 mmol/L (ref 3.5–5.3)
Sodium: 140 mmol/L (ref 135–146)
Total Protein: 6.2 g/dL (ref 6.1–8.1)

## 2016-12-08 MED ORDER — METHOTREXATE SODIUM CHEMO INJECTION 50 MG/2ML: 20.0000 mg | INTRAMUSCULAR | 0 refills | Status: DC

## 2016-12-08 MED FILL — METHOTREXATE 25 MG/ML VIAL: 50 | 88 days supply | Qty: 10 | Fill #0

## 2016-12-08 NOTE — Telephone Encounter (Signed)
ok 

## 2016-12-08 NOTE — Progress Notes (Signed)
Mild anemia

## 2016-12-08 NOTE — Telephone Encounter (Signed)
Patient requesting refill on MTX  Last Visit: 06/16/17 Next visit: 01/01/17 Labs: 12/07/16 Mild anemia  Okay to refill MTX

## 2016-12-11 MED FILL — FOLIC ACID 1 MG TABLET: 1 | 90 days supply | Qty: 180 | Fill #3

## 2016-12-11 MED FILL — SUMATRIPTAN SUCC 50 MG TAB: 50 | 30 days supply | Qty: 9 | Fill #3

## 2016-12-18 DIAGNOSIS — E039 Hypothyroidism, unspecified: Secondary | ICD-10-CM | POA: Diagnosis not present

## 2016-12-18 DIAGNOSIS — Z0000 Encounter for general adult medical examination without abnormal findings: Secondary | ICD-10-CM | POA: Diagnosis not present

## 2016-12-18 NOTE — Progress Notes (Signed)
Office Visit Note  Patient: Courtney Leonard             Date of Birth: July 26, 1966           MRN: [id number]             PCP: Deborah Chalk, FNP Referring: Deborah Chalk, FNP Visit Date: 01/01/2017 Occupation: @GUAROCC @    Subjective:  Pain of the Right Hand; Pain of the Left Hand; Pain of the Right Hip; Pain of the Left Hip; Pain of the Right Foot; Pain of the Left Foot; Muscle Pain; and Joint Pain   History of Present Illness: Courtney Leonard is a 51 y.o. female  With a history of rheumatoid arthritis with positive rheumatoid factor. At the last visit in 06/16/2016, she was having arthralgia and morning stiffness but no synovitis. She does have positive rheumatoid factor at right ear; negative CCP.  She is using methotrexate 0.8 ML's every week Folic acid 2 mg every day Plaquenil 200 mg in the morning and 100 mg at night Her Plaquenil eye exam is normal and up-to-date. Plaquenil eye exam was done approximately August 2017 in [facility] at a place called my eye doctor.   Today, patient states: Bilateral hip pain. In addition she is hypersensitive to the anterior thighs just with slight touch.  She is also complaining of pain to the bilateral SI joint that radiates down to the buttocks. (This is new to her) The buttock pain started approximately 2 months ago. She recalls it was there all of April in all of May. Patient describes paresthesia   Activities of Daily Living:  Patient reports morning stiffness for 60 minutes.   Patient Denies nocturnal pain.  Difficulty dressing/grooming: Reports Difficulty climbing stairs: Denies Difficulty getting out of chair: Reports Difficulty using hands for taps, buttons, cutlery, and/or writing: Reports   Review of Systems  Constitutional: Negative for fatigue.  HENT: Negative for mouth sores and mouth dryness.   Eyes: Negative for dryness.  Respiratory: Negative for shortness of breath.   Gastrointestinal:  Negative for constipation and diarrhea.  Musculoskeletal: Negative for myalgias and myalgias.  Skin: Negative for sensitivity to sunlight.  Psychiatric/Behavioral: Negative for decreased concentration and sleep disturbance.    PMFS History:  Patient Active Problem List   Diagnosis Date Noted  . Rheumatoid arthritis involving multiple sites with positive rheumatoid factor ([place]) 06/13/2016  . High risk medication use 06/13/2016  . Primary osteoarthritis of both feet 06/13/2016  . Insomnia 06/13/2016  . Acquired hypothyroidism 06/13/2016  . Migraine without status migrainosus, not intractable 06/13/2016  . Former smoker 06/13/2016  . Anxiety 01/08/2011    Past Medical History:  Diagnosis Date  . Anal fissure   . Anxiety   . Arthritis   . Depression   . Hypothyroidism   . Insomnia   . Migraine   . Osteoarthritis   . Rheumatoid arthritis ([place])   . Thyroid disease     Family History  Problem Relation Age of Onset  . Diabetes Mother   . Hypertension Mother   . Atrial fibrillation Father   . Prostate cancer Father    Past Surgical History:  Procedure Laterality Date  . ABDOMINAL HYSTERECTOMY    . APPENDECTOMY     c-section  . CESAREAN SECTION    . HEMORRHOID SURGERY    . KNEE SURGERY     RT knee  . laproscopy    . ROBOTIC ASSISTED SALPINGO OOPHERECTOMY Left 09/12/2014  Procedure: ROBOTIC ASSISTED Left SALPINGO OOPHORECTOMY/Right Salpingectomy/Pelvic Washings;  Surgeon: Princess Bruins, MD;  Location: [facility];  Service: Gynecology;  Laterality: Left;   Social History   Social History Narrative  . No narrative on file     Objective: Vital Signs: BP 103/71   Pulse 88   Resp 14   Wt 160 lb (72.6 kg)   BMI 27.46 kg/m    Physical Exam  Constitutional: She is oriented to person, place, and time. She appears well-developed and well-nourished.  HENT:  Head: Normocephalic and atraumatic.  Eyes: EOM are normal. Pupils are equal, round, and reactive to light.    Cardiovascular: Normal rate, regular rhythm and normal heart sounds.  Exam reveals no gallop and no friction rub.   No murmur heard. Pulmonary/Chest: Effort normal and breath sounds normal. She has no wheezes. She has no rales.  Abdominal: Soft. Bowel sounds are normal. She exhibits no distension. There is no tenderness. There is no guarding. No hernia.  Musculoskeletal: Normal range of motion. She exhibits no edema, tenderness or deformity.  Lymphadenopathy:    She has no cervical adenopathy.  Neurological: She is alert and oriented to person, place, and time. Coordination normal.  Skin: Skin is warm and dry. Capillary refill takes less than 2 seconds. No rash noted.  Psychiatric: She has a normal mood and affect. Her behavior is normal.  Nursing note and vitals reviewed.    Musculoskeletal Exam:  Full range of motion of all joints Grip strength is equal and strong bilaterally Fiber myalgia tender points are 0/18  CDAI Exam: CDAI Homunculus Exam:   Joint Counts:  CDAI Tender Joint count: 0 CDAI Swollen Joint count: 0  Global Assessments:  Patient Global Assessment: 4 Provider Global Assessment: 4  CDAI Calculated Score: 8    Investigation: Findings:  July 2017:  CBC, comprehensive metabolic panel, CK were normal.  CCP was negative.  HIV, hep panel, immunoglobulins, SPEP, TB Gold were all negative.   Normal baseline eye exam for Plaquenil 03/2016    CBC Latest Ref Rng & Units 12/07/2016 06/16/2016 04/20/2016  WBC 3.8 - 10.8 K/uL 3.8 3.9 4.2  Hemoglobin 11.7 - 15.5 g/dL 11.4(L) 12.3 12.2  Hematocrit 35.0 - 45.0 % 34.0(L) 37.2 37  Platelets 140 - 400 K/uL 222 215 217    CMP Latest Ref Rng & Units 12/07/2016 06/16/2016 04/20/2016  Glucose 65 - 99 mg/dL 80 82 -  BUN 7 - 25 mg/dL 12 16 20   Creatinine 0.50 - 1.10 mg/dL 0.92 0.93 0.8  Sodium 135 - 146 mmol/L 140 139 139  Potassium 3.5 - 5.3 mmol/L 3.8 4.1 4.0  Chloride 98 - 110 mmol/L 107 107 -  CO2 20 - 31 mmol/L 24 26 -   Calcium 8.6 - 10.2 mg/dL 8.6 8.9 -  Total Protein 6.1 - 8.1 g/dL 6.2 6.5 -  Total Bilirubin 0.2 - 1.2 mg/dL 0.2 0.4 -  Alkaline Phos 33 - 115 U/L 56 69 71  AST 10 - 35 U/L 15 14 12(A)  ALT 6 - 29 U/L 12 10 12    Imaging: No results found.  Speciality Comments: No specialty comments available.    Procedures:  No procedures performed Allergies: Sulfur   Assessment / Plan:     Visit Diagnoses: Rheumatoid arthritis involving multiple sites with positive rheumatoid factor (HCC)  Primary osteoarthritis of both feet  Primary insomnia  High risk medication use  Former smoker  History of migraine  History of hypothyroidism   Plan:  #  1: RA; +RF80,-CCP Ongoing morning stiffness of 1 hours since last visit and this visit. Hard to hold hairdryer C/o sausage digits to toes off and on. Inadequate response to methotrexate and Plaquenil combination. Consider biologic. Note plan: Our goal is to start the patient on a biologic and continue the methotrexate but we will stop the Plaquenil when the biologic is started. She will need updated labs to allow her to take the biologic. Biologic will be selected after we see what her insurance will allow her to take.  #2 High risk medication use - she is on Methotrexate 0.8 ML subcutaneously per week,   Plaquenil 200 mg a.m., 053 mg p.m., folic acid 2 mg a day -and tolerating these well  Plan: COMPLETE METABOLIC PANEL WITH GFR, CBC with Differential/Platelet, CBC with Differential/Platelet, COMPLETE METABOLIC PANEL WITH GFR will be obtained today and then every 3 months to monitor for drug toxicity.   She states she had her eye exam this year.  #3: Primary osteoarthritis of both feet: Mild stiffness.  #4: Handout and consent on biologic Appropriate labs to start biologic Recent labs of CBC with differential and CMP with GFR were done 12/07/2016 and were within normal limits.    Orders: No orders of the defined types were placed  in this encounter.  No orders of the defined types were placed in this encounter.   Face-to-face time spent with patient was 30 minutes. 50% of time was spent in counseling and coordination of care.  Follow-Up Instructions: Return in about 3 months (around 04/03/2017) for RA,MTX 0.8/PLQ 200AM 100QHS // 1 HR STIFFNESS //  HYPERSENSITIVE ANT THIGH.  We had detailed discussion regarding different treatment options and their side effects. Patient decided to proceed with him Simponi. We will apply for Simponi subcutaneous and contact patient once we have the approval. She will discontinue Plaquenil and continue on the methotrexate Simponi combination. Bo Merino, MD  Note - This record has been created using Editor, commissioning.  Chart creation errors have been sought, but may not always  have been located. Such creation errors do not reflect on  the standard of medical care.

## 2016-12-22 DIAGNOSIS — Z1239 Encounter for other screening for malignant neoplasm of breast: Secondary | ICD-10-CM | POA: Diagnosis not present

## 2016-12-22 DIAGNOSIS — R718 Other abnormality of red blood cells: Secondary | ICD-10-CM | POA: Diagnosis not present

## 2016-12-22 DIAGNOSIS — Z0000 Encounter for general adult medical examination without abnormal findings: Secondary | ICD-10-CM | POA: Diagnosis not present

## 2016-12-22 DIAGNOSIS — F419 Anxiety disorder, unspecified: Secondary | ICD-10-CM | POA: Diagnosis not present

## 2016-12-22 DIAGNOSIS — F5109 Other insomnia not due to a substance or known physiological condition: Secondary | ICD-10-CM | POA: Diagnosis not present

## 2016-12-22 DIAGNOSIS — D72819 Decreased white blood cell count, unspecified: Secondary | ICD-10-CM | POA: Diagnosis not present

## 2016-12-22 DIAGNOSIS — E039 Hypothyroidism, unspecified: Secondary | ICD-10-CM | POA: Diagnosis not present

## 2016-12-22 DIAGNOSIS — N951 Menopausal and female climacteric states: Secondary | ICD-10-CM | POA: Diagnosis not present

## 2016-12-22 MED FILL — ALPRAZolam 0.5 MG TABS: 0.5 | 30 days supply | Qty: 30 | Fill #0

## 2016-12-22 MED FILL — VENLAFAXINE HCL ER 150 MG C: 150 | 90 days supply | Qty: 90 | Fill #0

## 2017-01-01 ENCOUNTER — Ambulatory Visit (INDEPENDENT_AMBULATORY_CARE_PROVIDER_SITE_OTHER): Payer: 59 | Admitting: Rheumatology

## 2017-01-01 ENCOUNTER — Telehealth: Payer: Self-pay

## 2017-01-01 ENCOUNTER — Encounter: Payer: Self-pay | Admitting: Rheumatology

## 2017-01-01 VITALS — BP 103/71 | HR 88 | Resp 14 | Wt 160.0 lb

## 2017-01-01 DIAGNOSIS — F5101 Primary insomnia: Secondary | ICD-10-CM | POA: Diagnosis not present

## 2017-01-01 DIAGNOSIS — Z8669 Personal history of other diseases of the nervous system and sense organs: Secondary | ICD-10-CM | POA: Diagnosis not present

## 2017-01-01 DIAGNOSIS — M19071 Primary osteoarthritis, right ankle and foot: Secondary | ICD-10-CM

## 2017-01-01 DIAGNOSIS — Z8639 Personal history of other endocrine, nutritional and metabolic disease: Secondary | ICD-10-CM | POA: Diagnosis not present

## 2017-01-01 DIAGNOSIS — M0579 Rheumatoid arthritis with rheumatoid factor of multiple sites without organ or systems involvement: Secondary | ICD-10-CM | POA: Diagnosis not present

## 2017-01-01 DIAGNOSIS — M19072 Primary osteoarthritis, left ankle and foot: Secondary | ICD-10-CM | POA: Diagnosis not present

## 2017-01-01 DIAGNOSIS — Z79899 Other long term (current) drug therapy: Secondary | ICD-10-CM | POA: Diagnosis not present

## 2017-01-01 DIAGNOSIS — Z87891 Personal history of nicotine dependence: Secondary | ICD-10-CM

## 2017-01-01 NOTE — Telephone Encounter (Signed)
A prior authorization for Simponi was submitted via cover my meds. Will update patient once we receive a response.  Chayton Murata, [place], CPhT 3:21 PM

## 2017-01-01 NOTE — Progress Notes (Signed)
Pharmacy Note  Subjective: Patient presents today to the [facility] to see Dr. Lacy Duverney. [place].  Patient is currently taking methotrexate 0.8 mL weekly, folic acid 2 mg daily, and hydroxychloroquine 200 mg in the morning and 100 mg in the evening.  She continues to have morning stiffness.  Decision was made to stop hydroxychloroquine and start patient on Simponi.  Patient seen by the pharmacist for counseling on Simponi.    Objective: TB Test: negative (02/25/16) Hepatitis panel: negative (02/25/16) HIV: negative (02/25/16)  CBC    Component Value Date/Time   WBC 3.8 12/07/2016 1457   RBC 3.47 (L) 12/07/2016 1457   HGB 11.4 (L) 12/07/2016 1457   HCT 34.0 (L) 12/07/2016 1457   PLT 222 12/07/2016 1457   MCV 98.0 12/07/2016 1457   MCH 32.9 12/07/2016 1457   MCHC 33.5 12/07/2016 1457   RDW 14.3 12/07/2016 1457   LYMPHSABS 1,406 12/07/2016 1457   MONOABS 228 12/07/2016 1457   EOSABS 38 12/07/2016 1457   BASOSABS 38 12/07/2016 1457    Assessment/Plan:  Counseled patient that Simponi is a TNF blocking agent.  Reviewed Simponi dose of 50 mg once a month.  Counseled patient on purpose, proper use, and adverse effects of Simponi.  Reviewed the most common adverse effects including infections and injection site reactions.  Discussed that there is the possibility of an increased risk of malignancy but it is not well understood if this increased risk is due to the medication or the disease state.  Advised patient to get yearly dermatology exams due to risk of skin cancer.  Reviewed the importance of regular labs while on Simponi therapy.  Counseled patient that Simponi should be held prior to scheduled surgery.  Counseled patient to avoid live vaccines while on Simponi.  Advised patient to get annual influenza vaccine and the pneumococcal vaccine as needed.  Patient confirms she has had both.  Provided patient with medication education material and answered all questions.  Patient  voiced understanding.  Patient consented to Simponi.  Will upload consent into the media tab.  Reviewed storage instructions for Simponi.  Advised patient to contact the office to schedule the first Simponi injection once the medication is approved by insurance.  Patient voiced understanding.    Elisabeth Most, Pharm.D., BCPS Clinical Pharmacist Pager: [phone] Phone: [phone] 01/01/2017 12:55 PM

## 2017-01-01 NOTE — Patient Instructions (Signed)
Standing Labs We placed an order today for your standing lab work.    Please come back and get your standing labs in 1 month then every 2 months  We have open lab Monday through Friday from 8:30-11:30 AM and 1:30-4 PM at the office of Dr. Tresa Moore, PA.   The office is located at [address] [place], [place], [place] [address] No appointment is necessary.   Labs are drawn by [facility].  You may receive a bill from [facility] for your lab work. If you have any questions regarding directions or hours of operation,  please call [phone].    Golimumab injection (subcutaneous or intravenous use) What is this medicine? GOLIMUMAB (goe LIM ue mab) is used to treat rheumatoid arthritis, psoriatic arthritis, and ankylosing spondylitis. It is also used to treat ulcerative colitis. This medicine may be used for other purposes; ask your health care provider or pharmacist if you have questions. COMMON BRAND NAME(S): Simponi, SIMPONI ARIA What should I tell my health care provider before I take this medicine? They need to know if you have any of these conditions: -cancer -diabetes -Guillain-Barre syndrome -heart failure -hepatitis B or history of hepatitis B infection -immune system problems -infection or history of infections -low blood counts like low white cell, platelet, or red cell counts -multiple sclerosis -recently received or scheduled to receive a vaccine -tuberculosis, a positive skin test for tuberculosis or have recently been in close contact with someone who has tuberculosis -an unusual reaction to golimumab, other medicines, latex, rubber, foods, dyes, or preservatives -pregnant or trying to get pregnant -breast-feeding How should I use this medicine? This medicine is for infusion into a vein or for injection under the skin. Infusions are given by a health care professional in a hospital or clinic setting. If you are to give your own medicine at home, you  will be taught how to prepare and give this medicine under the skin. Use exactly as directed. Take your medicine at regular intervals. Do not take your medicine more often than directed. It is important that you put your used needles and syringes in a special sharps container. Do not put them in a trash can. If you do not have a sharps container, call your pharmacist or healthcare provider to get one. A special MedGuide will be given to you by the pharmacist with each prescription and refill. Be sure to read this information carefully each time. Talk to your pediatrician regarding the use of this medicine in children. Special care may be needed. Overdosage: If you think you have taken too much of this medicine contact a poison control center or emergency room at once. NOTE: This medicine is only for you. Do not share this medicine with others. What if I miss a dose? If you give your medicine by injection under the skin: If you miss a dose, take it as soon as you can. If it is almost time for your next dose, take only that dose. Do not take double or extra doses. Call your doctor or health care professional if you are not sure how to handle a missed dose. If you are to be given an infusion: It is important not to miss your dose. Call your doctor or health care professional if you are unable to keep an appointment. What may interact with this medicine? Do not take this medicine with any of the following medications: -abatacept -adalimumab -anakinra -certolizumab -etanercept -infliximab -live virus vaccines -rituximab -rilonacept -tocilizumab This medicine  may also interact with the following medications: -cyclosporine -theophylline -tofacitinib -vaccines -warfarin This list may not describe all possible interactions. Give your health care provider a list of all the medicines, herbs, non-prescription drugs, or dietary supplements you use. Also tell them if you smoke, drink alcohol, or use  illegal drugs. Some items may interact with your medicine. What should I watch for while using this medicine? Visit your doctor or health care professional for regular checks on your progress. Tell your doctor or healthcare professional if your symptoms do not start to get better or if they get worse. You will be tested for tuberculosis (TB) before you start this medicine. If your doctor prescribes any medicine for TB, you should start taking the TB medicine before starting this medicine. Make sure to finish the full course of TB medicine. Call your doctor or health care professional if you get a cold or other infection while receiving this medicine. Do not treat yourself. This medicine may decrease your body's ability to fight infection. Talk to your doctor about your risk of cancer. You may be more at risk for certain types of cancers if you take this medicine. What side effects may I notice from receiving this medicine? Side effects that you should report to your doctor or health care professional as soon as possible: -allergic reactions like skin rash, itching or hives, swelling of the face, lips, or tongue -breathing problems -changes in vision -chest pain -joint or muscle pain -mouth sores -numbness or tingling in any part of your body -red, scaly patches or raised bumps on the skin -signs and symptoms of infection like fever or chills; cough; sore throat; pain or trouble passing urine -signs and symptoms of liver injury like dark yellow or brown urine; general ill feeling or flu-like symptoms; light-colored stools; loss of appetite; nausea; right upper belly pain; unusually weak or tired; yellowing of the eyes or skin -swelling of the legs or ankles -swollen lymph nodes in the neck, underarm, or groin areas -unexplained weight loss -unusual bleeding or bruising -unusually weak or tired Side effects that usually do not require medical attention (report to your doctor or health care  professional if they continue or are bothersome): -dizziness -increased blood pressure -nausea -redness, itching, swelling, or bruising at site where injected -runny nose This list may not describe all possible side effects. Call your doctor for medical advice about side effects. You may report side effects to FDA at 1-800-FDA-1088. Where should I keep my medicine? Infusions will be given in a hospital or clinic and will not be stored at home. Storage for syringes given under the skin and stored at home: Keep out of the reach of children. Store in the original container in a refrigerator between 2 and 8 degrees C (36 and 46 degrees F). Keep this medicine in the original container. Protect from light. Do not freeze. Throw away any unused medicine after the expiration date. NOTE: This sheet is a summary. It may not cover all possible information. If you have questions about this medicine, talk to your doctor, pharmacist, or health care provider.  2018 Elsevier/Gold Standard (2016-06-10 08:42:06)

## 2017-01-06 ENCOUNTER — Telehealth: Payer: Self-pay

## 2017-01-06 ENCOUNTER — Telehealth: Payer: Self-pay | Admitting: Pharmacist

## 2017-01-06 NOTE — Telephone Encounter (Signed)
Received results of Simponi prior authorization.  Request was denied as patient must have trial of the formulary preferred immunomodulators: Enbrel AND Humira.    I called patient and informed her of denial.  We had tried to apply for Simponi as patient preferred less frequent dosing.  She would prefer to apply for Humira.   Okay to apply for Humira?

## 2017-01-06 NOTE — Telephone Encounter (Signed)
A prior authorization for Humira was submitted via cover my meds. Will update once we receive a response.   Lisabeth Mian, [place], CPhT 10:37 AM

## 2017-01-06 NOTE — Telephone Encounter (Signed)
ok 

## 2017-01-06 NOTE — Telephone Encounter (Signed)
Can you please submit Humira PA?

## 2017-01-11 MED ORDER — ADALIMUMAB 40 MG/0.8ML ~~LOC~~ AJKT: 40.0000 mg | AUTO-INJECTOR | SUBCUTANEOUS | 2 refills | Status: DC

## 2017-01-11 NOTE — Telephone Encounter (Signed)
Received a fax from [facility] regarding a prior authorization approval for [place] from 01/08/17 to 07/09/17.   Reference number:[id number] Phone number:[phone]  Will send document to scan center.  Jonalyn Sedlak, [place], CPhT  11:18 AM

## 2017-01-11 NOTE — Addendum Note (Signed)
Addended by: Cyndia Skeeters on: 01/11/2017 04:50 PM   Modules accepted: Orders

## 2017-01-11 NOTE — Telephone Encounter (Signed)
I informed patient of Humira authorization.  Prescription sent to pharmacy for delivery to clinic for initial injection.  Will need to get patient to sign Humira consent form when she comes in for initial injection.  Once prescription is filled by pharmacy/delivered to clinic, will schedule nurse visit for initial injection.  Patient voiced understanding.   Elisabeth Most, Pharm.D., BCPS, CPP Clinical Pharmacist Pager: [phone] Phone: [phone] 01/11/2017 4:48 PM

## 2017-01-13 DIAGNOSIS — Z139 Encounter for screening, unspecified: Secondary | ICD-10-CM | POA: Diagnosis not present

## 2017-01-13 DIAGNOSIS — Z1231 Encounter for screening mammogram for malignant neoplasm of breast: Secondary | ICD-10-CM | POA: Diagnosis not present

## 2017-01-15 ENCOUNTER — Encounter: Payer: Self-pay | Admitting: Pharmacist

## 2017-01-15 NOTE — Progress Notes (Deleted)
   S: Patient presents to [facility] for review of their specialty medication therapy.  Patient is going to start taking Humira for rheumatoid arthritis. Patient is managed by Dr. Estanislado Pandy for this. The preference was for her to be on Simponi but she must fail Humira and Enbrel before she can try Simponi.   Adherence: is just starting Humira  Efficacy: is just starting Humira  Dosing:  Rheumatoid arthritis: SubQ: 40 mg every other week (may continue methotrexate, other nonbiologic DMARDS, corticosteroids, NSAIDs, and/or analgesics)  Dose adjustments: Renal: no dose adjustments (has not been studied) Hepatic: no dose adjustments (has not been studied)  Drug-drug interactions: none  Screening: TB test: completed per patient Hepatitis: completed per patient  Monitoring: S/sx of infection: denies CBC: completed last month S/sx of hypersensitivity: hasn't started S/sx of malignancy: denies S/sx of heart failure: denies  O:     Lab Results  Component Value Date   WBC 3.8 12/07/2016   HGB 11.4 (L) 12/07/2016   HCT 34.0 (L) 12/07/2016   MCV 98.0 12/07/2016   PLT 222 12/07/2016      Chemistry      Component Value Date/Time   NA 140 12/07/2016 1457   NA 139 04/20/2016   K 3.8 12/07/2016 1457   CL 107 12/07/2016 1457   CO2 24 12/07/2016 1457   BUN 12 12/07/2016 1457   BUN 20 04/20/2016   CREATININE 0.92 12/07/2016 1457   GLU 93 04/20/2016      Component Value Date/Time   CALCIUM 8.6 12/07/2016 1457   ALKPHOS 56 12/07/2016 1457   AST 15 12/07/2016 1457   ALT 12 12/07/2016 1457   BILITOT 0.2 12/07/2016 1457       A/P: 1. Medication review: Patient currently on Humira for rheumatoid arthritis. Reviewed the medication with the patient, including the following: Humira is a TNF blocking agent indicated for ankylosing spondylitis, Crohn's disease, Hidradenitis suppurativa, psoriatic arthritis, plaque psoriasis, ulcerative colitis, and  uveitis. The most common adverse effects are infections, headache, and injection site reactions. There is the possibility of an increased risk of malignancy but it is not well understood if this increased risk is due to there medication or the disease state. There are rare cases of pancytopenia and aplastic anemia. No recommendations for changes at this time.    Christella Hartigan, PharmD, BCPS, BCACP, [facility] [phone]

## 2017-01-19 ENCOUNTER — Ambulatory Visit: Payer: 59 | Attending: Internal Medicine | Admitting: Pharmacist

## 2017-01-19 ENCOUNTER — Telehealth: Payer: Self-pay | Admitting: Pharmacist

## 2017-01-19 DIAGNOSIS — Z79899 Other long term (current) drug therapy: Secondary | ICD-10-CM

## 2017-01-19 MED ORDER — ADALIMUMAB 40 MG/0.8ML ~~LOC~~ AJKT: 40.0000 mg | AUTO-INJECTOR | SUBCUTANEOUS | 2 refills | Status: DC

## 2017-01-19 MED FILL — HUMIRA PEN 40 MG/0.8ML PNKT: 40 | 28 days supply | Qty: 2 | Fill #0

## 2017-01-19 NOTE — Telephone Encounter (Signed)
Patient's prescription has been processed by the pharmacy and will be ready later today and will be delivered to clinic.  I called patient to set up nurse visit for initial injection.  Nurse visit scheduled for Friday, 01/22/17 at 2:00 PM.    Elisabeth Most, Pharm.D., BCPS, CPP Clinical Pharmacist Pager: [phone] Phone: [phone] 01/19/2017 4:14 PM

## 2017-01-19 NOTE — Progress Notes (Signed)
   S: Patient presents to [facility] for review of their specialty medication therapy.  Patient is currently taking Humira for rheumatoid arthritis. Patient is managed by Dr. Estanislado Pandy for this.   Adherence: hasn't started yet  Efficacy: hasn't started yet  Dosing:  Rheumatoid arthritis: SubQ: 40 mg every other week (may continue methotrexate, other nonbiologic DMARDS, corticosteroids, NSAIDs, and/or analgesics); patients not taking concomitant methotrexate may increase dose to 40 mg every week  Drug-drug interactions: none  Screening: TB test: completed per patient Hepatitis: completed per patient  Monitoring: S/sx of infection: denies CBC: last HGB 11.4 from last month S/sx of hypersensitivity: denies S/sx of malignancy: denies S/sx of heart failure: denies  O:     Lab Results  Component Value Date   WBC 3.8 12/07/2016   HGB 11.4 (L) 12/07/2016   HCT 34.0 (L) 12/07/2016   MCV 98.0 12/07/2016   PLT 222 12/07/2016      Chemistry      Component Value Date/Time   NA 140 12/07/2016 1457   NA 139 04/20/2016   K 3.8 12/07/2016 1457   CL 107 12/07/2016 1457   CO2 24 12/07/2016 1457   BUN 12 12/07/2016 1457   BUN 20 04/20/2016   CREATININE 0.92 12/07/2016 1457   GLU 93 04/20/2016      Component Value Date/Time   CALCIUM 8.6 12/07/2016 1457   ALKPHOS 56 12/07/2016 1457   AST 15 12/07/2016 1457   ALT 12 12/07/2016 1457   BILITOT 0.2 12/07/2016 1457       A/P: 1. Medication review: Patient currently on Humira for rheumatoid arthritis. Reviewed the medication with the patient, including the following: Humira is a TNF blocking agent indicated for ankylosing spondylitis, Crohn's disease, Hidradenitis suppurativa, psoriatic arthritis, plaque psoriasis, ulcerative colitis, and uveitis. The most common adverse effects are infections, headache, and injection site reactions. There is the possibility of an increased risk of malignancy but it is not  well understood if this increased risk is due to there medication or the disease state. There are rare cases of pancytopenia and aplastic anemia. No recommendations for any changes at this time.   Christella Hartigan, PharmD, BCPS, BCACP, [facility] [phone]

## 2017-01-21 ENCOUNTER — Telehealth: Payer: Self-pay

## 2017-01-21 NOTE — Telephone Encounter (Signed)
Coordinated delivery of Humira from [facility]. Medication has been put in the refrigerator.  Cecely Rengel, [place], CPhT 2:02 PM

## 2017-01-22 ENCOUNTER — Other Ambulatory Visit: Payer: Self-pay | Admitting: Rheumatology

## 2017-01-22 ENCOUNTER — Ambulatory Visit (INDEPENDENT_AMBULATORY_CARE_PROVIDER_SITE_OTHER): Payer: 59 | Admitting: *Deleted

## 2017-01-22 VITALS — BP 107/67 | HR 78

## 2017-01-22 DIAGNOSIS — M0579 Rheumatoid arthritis with rheumatoid factor of multiple sites without organ or systems involvement: Secondary | ICD-10-CM

## 2017-01-22 DIAGNOSIS — Z79899 Other long term (current) drug therapy: Secondary | ICD-10-CM | POA: Diagnosis not present

## 2017-01-22 MED ORDER — ADALIMUMAB 40 MG/0.8ML ~~LOC~~ PSKT
40.0000 mg | PREFILLED_SYRINGE | Freq: Once | SUBCUTANEOUS | Status: AC
  Administered 2017-01-22: 40 mg via SUBCUTANEOUS

## 2017-01-22 MED FILL — BD ALLERGY SYRINGE 1 ML 28G: 28G X 1/2" | 74 days supply | Qty: 10 | Fill #3

## 2017-01-22 NOTE — Progress Notes (Signed)
Patient in office for initial Humira injection. Patient was given injection in her right thigh. Patient supplied her Humira injection. Patient tolerated injection well. Patient was monitored in office for 30 minutes after administration for adverse reactions. No adverse reactions noted.   Administrations This Visit    Adalimumab PSKT 40 mg    Admin Date 01/22/2017 Action Given Dose 40 mg Route Subcutaneous Administered By Carole Binning, LPN

## 2017-01-22 NOTE — Patient Instructions (Signed)
Standing Labs We placed an order today for your standing lab work.    Please come back and get your standing labs in 1 month then every 2 months  We have open lab Monday through Friday from 8:30-11:30 AM and 1:30-4 PM at the office of Dr. Tresa Moore, PA.   The office is located at [address], [place], [place], [place] [address] No appointment is necessary.   Labs are drawn by [facility].  You may receive a bill from [facility] for your lab work. If you have any questions regarding directions or hours of operation,  please call [phone].    Adalimumab Injection What is this medicine? ADALIMUMAB (a dal AYE mu mab) is used to treat rheumatoid and psoriatic arthritis. It is also used to treat ankylosing spondylitis, Crohn's disease, ulcerative colitis, plaque psoriasis, hidradenitis suppurativa, and uveitis. This medicine may be used for other purposes; ask your health care provider or pharmacist if you have questions. COMMON BRAND NAME(S): CYLTEZO, Humira What should I tell my health care provider before I take this medicine? They need to know if you have any of these conditions: -diabetes -heart disease -hepatitis B or history of hepatitis B infection -immune system problems -infection or history of infections -multiple sclerosis -recently received or scheduled to receive a vaccine -scheduled to have surgery -tuberculosis, a positive skin test for tuberculosis or have recently been in close contact with someone who has tuberculosis -an unusual reaction to adalimumab, other medicines, mannitol, latex, rubber, foods, dyes, or preservatives -pregnant or trying to get pregnant -breast-feeding How should I use this medicine? This medicine is for injection under the skin. You will be taught how to prepare and give this medicine. Use exactly as directed. Take your medicine at regular intervals. Do not take your medicine more often than directed. A special MedGuide will  be given to you by the pharmacist with each prescription and refill. Be sure to read this information carefully each time. It is important that you put your used needles and syringes in a special sharps container. Do not put them in a trash can. If you do not have a sharps container, call your pharmacist or healthcare provider to get one. Talk to your pediatrician regarding the use of this medicine in children. While this drug may be prescribed for children as young as 2 years for selected conditions, precautions do apply. The manufacturer of the medicine offers free information to patients and their health care partners. Call 5734023598 for more information. Overdosage: If you think you have taken too much of this medicine contact a poison control center or emergency room at once. NOTE: This medicine is only for you. Do not share this medicine with others. What if I miss a dose? If you miss a dose, take it as soon as you can. If it is almost time for your next dose, take only that dose. Do not take double or extra doses. Give the next dose when your next scheduled dose is due. Call your doctor or health care professional if you are not sure how to handle a missed dose. What may interact with this medicine? Do not take this medicine with any of the following medications: -abatacept -anakinra -etanercept -infliximab -live virus vaccines -rilonacept This medicine may also interact with the following medications: -vaccines This list may not describe all possible interactions. Give your health care provider a list of all the medicines, herbs, non-prescription drugs, or dietary supplements you use. Also tell them if you  smoke, drink alcohol, or use illegal drugs. Some items may interact with your medicine. What should I watch for while using this medicine? Visit your doctor or health care professional for regular checks on your progress. Tell your doctor or healthcare professional if your symptoms  do not start to get better or if they get worse. You will be tested for tuberculosis (TB) before you start this medicine. If your doctor prescribes any medicine for TB, you should start taking the TB medicine before starting this medicine. Make sure to finish the full course of TB medicine. Call your doctor or health care professional if you get a cold or other infection while receiving this medicine. Do not treat yourself. This medicine may decrease your body's ability to fight infection. Talk to your doctor about your risk of cancer. You may be more at risk for certain types of cancers if you take this medicine. What side effects may I notice from receiving this medicine? Side effects that you should report to your doctor or health care professional as soon as possible: -allergic reactions like skin rash, itching or hives, swelling of the face, lips, or tongue -breathing problems -changes in vision -chest pain -fever, chills, or any other sign of infection -numbness or tingling -red, scaly patches or raised bumps on the skin -swelling of the ankles -swollen lymph nodes in the neck, underarm, or groin areas -unexplained weight loss -unusual bleeding or bruising -unusually weak or tired Side effects that usually do not require medical attention (report to your doctor or health care professional if they continue or are bothersome): -headache -nausea -redness, itching, swelling, or bruising at site where injected This list may not describe all possible side effects. Call your doctor for medical advice about side effects. You may report side effects to FDA at 1-800-FDA-1088. Where should I keep my medicine? Keep out of the reach of children. Store in the original container and in the refrigerator between 2 and 8 degrees C (36 and 46 degrees F). Do not freeze. The product may be stored in a cool carrier with an ice pack, if needed. Protect from light. Throw away any unused medicine after the  expiration date. NOTE: This sheet is a summary. It may not cover all possible information. If you have questions about this medicine, talk to your doctor, pharmacist, or health care provider.  2018 Elsevier/Gold Standard (2015-02-06 11:11:43)

## 2017-01-22 NOTE — Progress Notes (Signed)
Pharmacy Note  Subjective:   Patient presents to [facility] for initiation of Humira.  Patient seen by the pharmacist for counseling on Humira.    Objective: TB Test: negative (02/25/16) Hepatitis panel: negative (02/25/16) HIV: negative (02/25/16)  CBC    Component Value Date/Time   WBC 3.8 12/07/2016 1457   RBC 3.47 (L) 12/07/2016 1457   HGB 11.4 (L) 12/07/2016 1457   HCT 34.0 (L) 12/07/2016 1457   PLT 222 12/07/2016 1457   MCV 98.0 12/07/2016 1457   MCH 32.9 12/07/2016 1457   MCHC 33.5 12/07/2016 1457   RDW 14.3 12/07/2016 1457   LYMPHSABS 1,406 12/07/2016 1457   MONOABS 228 12/07/2016 1457   EOSABS 38 12/07/2016 1457   BASOSABS 38 12/07/2016 1457    Assessment/Plan:  Counseled patient that Humira is a TNF blocking agent.  Reviewed Humira dose of 40 mg every other week.  Counseled patient on purpose, proper use, and adverse effects of Humira.  Reviewed the most common adverse effects including infections, headache, and injection site reactions. Discussed that there is the possibility of an increased risk of malignancy but it is not well understood if this increased risk is due to the medication or the disease state.  Advised patient to get yearly dermatology exams due to risk of skin cancer.  Reviewed the importance of regular labs while on Humira therapy.  Advised patient to get standing labs one month after starting Humira then every 2 months.  Provided patient with standing lab orders.  Counseled patient that Humira should be held prior to scheduled surgery.  Counseled patient to avoid live vaccines while on Humira.  Advised patient to get annual influenza vaccine and the pneumococcal vaccine as needed.  Provided patient with medication education material and answered all questions.  Patient voiced understanding.  Patient consented to Humira.  Will upload consent into the media tab.  Reviewed storage instructions of Humira.  Patient was counseled on how to administer  subcutaneous Humira injection using a demonstration pen.  Patient received initial Humira injection.  The medication was administered in the right thigh.  Lot: [id number], Exp. 06/2018.  Patient was monitored for 30 minutes post injection.  No injection site reaction noted.  Patient has follow up scheduled on 04/06/17.   Elisabeth Most, Pharm.D., BCPS Clinical Pharmacist Pager: [phone] Phone: [phone] 01/22/2017 1:54 PM

## 2017-02-11 MED FILL — TOPIRAMATE 100 MG TABLET: 100 | 90 days supply | Qty: 180 | Fill #0

## 2017-02-11 MED FILL — LEVOTHYROXINE 75 MCG TABLET: 75 | 90 days supply | Qty: 90 | Fill #0

## 2017-02-12 MED FILL — HUMIRA PEN 40 MG/0.8ML PNKT: 40 | 28 days supply | Qty: 2 | Fill #1

## 2017-03-02 ENCOUNTER — Telehealth: Payer: Self-pay | Admitting: Rheumatology

## 2017-03-02 MED ORDER — METHOTREXATE SODIUM CHEMO INJECTION 50 MG/2ML: 20.0000 mg | INTRAMUSCULAR | 0 refills | Status: DC

## 2017-03-02 MED ORDER — "TUBERCULIN-ALLERGY SYRINGES 27G X 1/2"" 1 ML MISC": 3 refills | Status: DC

## 2017-03-02 MED FILL — BD ALLERGY SYRINGE 1 ML 28G: 28G X 1/2" | 84 days supply | Qty: 12 | Fill #0

## 2017-03-02 MED FILL — METHOTREXATE 25 MG/ML VIAL: 50 | 88 days supply | Qty: 10 | Fill #0

## 2017-03-02 NOTE — Telephone Encounter (Signed)
Patient Courtney Leonard to see when she is due for lab work. Please call to advise.

## 2017-03-02 NOTE — Telephone Encounter (Signed)
Patient advised she is due for labs around March 09, 2017. Patient will update labs this week. Patient needs refill on MTX.   Last Visit: 01/01/17 Next Visit: 04/06/17 Labs: 12/07/16 mild anemia  Okay to refill per Dr, Estanislado Pandy.

## 2017-03-04 ENCOUNTER — Telehealth: Payer: Self-pay | Admitting: Radiology

## 2017-03-04 ENCOUNTER — Other Ambulatory Visit: Payer: Self-pay

## 2017-03-04 DIAGNOSIS — Z79899 Other long term (current) drug therapy: Secondary | ICD-10-CM

## 2017-03-04 NOTE — Telephone Encounter (Signed)
Patient came in for labs today and has expressed concern over weight gain,states she has gained 4 lbs, she weighed today in the clinic, she wants to know if you think this is from the Humira.

## 2017-03-04 NOTE — Telephone Encounter (Signed)
No Humira does not cause weight gain.

## 2017-03-04 NOTE — Telephone Encounter (Signed)
Patient advised Humira does not cause weight gain.

## 2017-03-05 LAB — COMPLETE METABOLIC PANEL WITH GFR
ALBUMIN: 4.6 g/dL (ref 3.6–5.1)
ALT: 16 U/L (ref 6–29)
AST: 16 U/L (ref 10–35)
Alkaline Phosphatase: 72 U/L (ref 33–130)
BILIRUBIN TOTAL: 0.2 mg/dL (ref 0.2–1.2)
BUN: 12 mg/dL (ref 7–25)
CO2: 25 mmol/L (ref 20–31)
Calcium: 9.1 mg/dL (ref 8.6–10.4)
Chloride: 105 mmol/L (ref 98–110)
Creat: 0.89 mg/dL (ref 0.50–1.05)
GFR, Est African American: 87 mL/min (ref >=60)
GFR, Est Non African American: 76 mL/min (ref >=60)
GLUCOSE: 86 mg/dL (ref 65–99)
Potassium: 4 mmol/L (ref 3.5–5.3)
Sodium: 139 mmol/L (ref 135–146)
TOTAL PROTEIN: 7 g/dL (ref 6.1–8.1)

## 2017-03-05 LAB — CBC WITH DIFFERENTIAL/PLATELET
BASOS ABS: 44 {cells}/uL (ref 0–200)
Basophils Relative: 1 %
Eosinophils Absolute: 44 cells/uL (ref 15–500)
Eosinophils Relative: 1 %
HCT: 37.9 % (ref 35.0–45.0)
HEMOGLOBIN: 12.5 g/dL (ref 11.7–15.5)
Lymphocytes Relative: 34 %
Lymphs Abs: 1496 cells/uL (ref 850–3900)
MCH: 33.2 pg — AB (ref 27.0–33.0)
MCHC: 33 g/dL (ref 32.0–36.0)
MCV: 100.5 fL — AB (ref 80.0–100.0)
MONOS PCT: 6 %
MPV: 9.5 fL (ref 7.5–12.5)
Monocytes Absolute: 264 cells/uL (ref 200–950)
NEUTROS ABS: 2552 {cells}/uL (ref 1500–7800)
NEUTROS PCT: 58 %
PLATELETS: 244 10*3/uL (ref 140–400)
RBC: 3.77 MIL/uL — ABNORMAL LOW (ref 3.80–5.10)
RDW: 14.5 % (ref 11.0–15.0)
WBC: 4.4 10*3/uL (ref 3.8–10.8)

## 2017-03-07 LAB — QUANTIFERON TB GOLD ASSAY (BLOOD)
INTERFERON GAMMA RELEASE ASSAY: NEGATIVE
QUANTIFERON TB AG MINUS NIL: 0.01 [IU]/mL
Quantiferon Nil Value: 0.07 IU/mL

## 2017-03-07 NOTE — Progress Notes (Signed)
WNL

## 2017-03-08 ENCOUNTER — Other Ambulatory Visit: Payer: Self-pay | Admitting: Rheumatology

## 2017-03-08 MED FILL — FOLIC ACID 1 MG TABLET: 1 | 90 days supply | Qty: 180 | Fill #0

## 2017-03-08 NOTE — Telephone Encounter (Signed)
Last Visit: 01/01/17 Next Visit: 04/06/17  Okay to refill per Dr. Estanislado Pandy

## 2017-03-10 ENCOUNTER — Other Ambulatory Visit: Payer: Self-pay | Admitting: Internal Medicine

## 2017-03-11 ENCOUNTER — Telehealth: Payer: Self-pay | Admitting: Rheumatology

## 2017-03-11 MED FILL — HUMIRA PEN 40 MG/0.8ML PNKT: 40 | 28 days supply | Qty: 2 | Fill #0

## 2017-03-11 NOTE — Telephone Encounter (Signed)
Patient advised Dr. Koleen Nimrod will reach out to [place] to find out what the issue is and will call her back with the answer.

## 2017-03-11 NOTE — Telephone Encounter (Signed)
Patient tried calling in a refill of Humira and the pharmacy says they cannot get a refill of Humria for her and she is wondering why? Patient uses [facility].

## 2017-03-12 NOTE — Telephone Encounter (Signed)
I called the pharmacy and confirmed patient had one refill remaining for her Humira.  Humira refill set up with patient to be delivered on Tuesday.    Elisabeth Most, Pharm.D., BCPS, CPP Clinical Pharmacist Pager: [phone] Phone: [phone] 03/12/2017 8:17 AM

## 2017-04-01 NOTE — Progress Notes (Signed)
Office Visit Note  Patient: Courtney Leonard             Date of Birth: [birth year]           MRN: [id number]             PCP: Deborah Chalk, FNP Referring: Deborah Chalk, FNP Visit Date: 04/06/2017 Occupation: @GUAROCC @    Subjective:  Pain in hands and feet.   History of Present Illness: Courtney Leonard is a 51 y.o. female with history of sero positive rheumatoid arthritis. She states she had been doing better since she started Humira in June 2018. Although the last week she started experiencing increased pain and discomfort in her bilateral hands and bilateral feet. Her symptoms are better today. She states she still hurts some in her feet for she's walking.  Activities of Daily Living:  Patient reports morning stiffness for 30 minutes.   Patient Denies nocturnal pain.  Difficulty dressing/grooming: Denies Difficulty climbing stairs: Denies Difficulty getting out of chair: Denies Difficulty using hands for taps, buttons, cutlery, and/or writing: Denies   Review of Systems  Constitutional: Positive for fatigue. Negative for night sweats, weight gain, weight loss and weakness.  HENT: Positive for mouth dryness. Negative for mouth sores, trouble swallowing, trouble swallowing and nose dryness.        Due to meds  Eyes: Negative for pain, redness, visual disturbance and dryness.  Respiratory: Negative for cough, shortness of breath and difficulty breathing.   Cardiovascular: Negative for chest pain, palpitations, hypertension, irregular heartbeat and swelling in legs/feet.  Gastrointestinal: Negative for blood in stool, constipation and diarrhea.  Endocrine: Negative for increased urination.  Genitourinary: Negative for vaginal dryness.  Musculoskeletal: Positive for arthralgias, joint pain and morning stiffness. Negative for joint swelling, myalgias, muscle weakness, muscle tenderness and myalgias.  Skin: Negative for color change, rash, hair loss, skin tightness, ulcers  and sensitivity to sunlight.  Allergic/Immunologic: Negative for susceptible to infections.  Neurological: Negative for dizziness, memory loss and night sweats.  Hematological: Negative for swollen glands.  Psychiatric/Behavioral: Negative for depressed mood and sleep disturbance. The patient is not nervous/anxious.     PMFS History:  Patient Active Problem List   Diagnosis Date Noted  . Rheumatoid arthritis involving multiple sites with positive rheumatoid factor ([place]) 06/13/2016  . High risk medication use 06/13/2016  . Primary osteoarthritis of both feet 06/13/2016  . Insomnia 06/13/2016  . Acquired hypothyroidism 06/13/2016  . Migraine without status migrainosus, not intractable 06/13/2016  . Former smoker 06/13/2016  . Anxiety 01/08/2011    Past Medical History:  Diagnosis Date  . Anal fissure   . Anxiety   . Arthritis   . Depression   . Hypothyroidism   . Insomnia   . Migraine   . Osteoarthritis   . Rheumatoid arthritis ([place])   . Thyroid disease     Family History  Problem Relation Age of Onset  . Diabetes Mother   . Hypertension Mother   . Atrial fibrillation Father   . Prostate cancer Father    Past Surgical History:  Procedure Laterality Date  . ABDOMINAL HYSTERECTOMY    . APPENDECTOMY     c-section  . CESAREAN SECTION    . HEMORRHOID SURGERY    . KNEE SURGERY     RT knee  . laproscopy    . ROBOTIC ASSISTED SALPINGO OOPHERECTOMY Left 09/12/2014   Procedure: ROBOTIC ASSISTED Left SALPINGO OOPHORECTOMY/Right Salpingectomy/Pelvic Washings;  Surgeon: Princess Bruins, MD;  Location: [facility]  ORS;  Service: Gynecology;  Laterality: Left;   Social History   Social History Narrative  . No narrative on file     Objective: Vital Signs: BP 120/72   Resp 16   Ht 5' 3.5" (1.613 m)   Wt 162 lb (73.5 kg)   BMI 28.25 kg/m    Physical Exam  Constitutional: She is oriented to person, place, and time. She appears well-developed and well-nourished.  HENT:  Head:  Normocephalic and atraumatic.  Eyes: Conjunctivae and EOM are normal.  Neck: Normal range of motion.  Cardiovascular: Normal rate, regular rhythm, normal heart sounds and intact distal pulses.   Pulmonary/Chest: Effort normal and breath sounds normal.  Abdominal: Soft. Bowel sounds are normal.  Lymphadenopathy:    She has no cervical adenopathy.  Neurological: She is alert and oriented to person, place, and time.  Skin: Skin is warm and dry. Capillary refill takes less than 2 seconds.  Psychiatric: She has a normal mood and affect. Her behavior is normal.  Nursing note and vitals reviewed.    Musculoskeletal Exam: C-spine and thoracic lumbar spine good range of motion. Shoulder joints although joints wrist joint MCPs PIPs DIPs are good range of motion with no synovitis. Hip joints knee joints ankles MTPs PIPs DIPs with good range of motion with no synovitis.  CDAI Exam: CDAI Homunculus Exam:   Joint Counts:  CDAI Tender Joint count: 0 CDAI Swollen Joint count: 0  Global Assessments:  Patient Global Assessment: 5 Provider Global Assessment: 2  CDAI Calculated Score: 7    Investigation: Findings:  03/04/2017 negative TB gold   CBC Latest Ref Rng & Units 03/04/2017 12/07/2016 06/16/2016  WBC 3.8 - 10.8 K/uL 4.4 3.8 3.9  Hemoglobin 11.7 - 15.5 g/dL 12.5 11.4(L) 12.3  Hematocrit 35.0 - 45.0 % 37.9 34.0(L) 37.2  Platelets 140 - 400 K/uL 244 222 215   CMP Latest Ref Rng & Units 03/04/2017 12/07/2016 06/16/2016  Glucose 65 - 99 mg/dL 86 80 82  BUN 7 - 25 mg/dL 12 12 16   Creatinine 0.50 - 1.05 mg/dL 0.89 0.92 0.93  Sodium 135 - 146 mmol/L 139 140 139  Potassium 3.5 - 5.3 mmol/L 4.0 3.8 4.1  Chloride 98 - 110 mmol/L 105 107 107  CO2 20 - 31 mmol/L 25 24 26   Calcium 8.6 - 10.4 mg/dL 9.1 8.6 8.9  Total Protein 6.1 - 8.1 g/dL 7.0 6.2 6.5  Total Bilirubin 0.2 - 1.2 mg/dL 0.2 0.2 0.4  Alkaline Phos 33 - 130 U/L 72 56 69  AST 10 - 35 U/L 16 15 14   ALT 6 - 29 U/L 16 12 10       Imaging: No results found.  Speciality Comments: No specialty comments available.    Procedures:  No procedures performed Allergies: Sulfur   Assessment / Plan:     Visit Diagnoses: Rheumatoid arthritis involving multiple sites with positive rheumatoid factor ([place]): Patient has no active synovitis on examination although she complains of arthralgias. In my opinion she had very good response to Humira area and  High risk medication use - Methotrexate 0.8 ML subcutaneous every week, folic acid 2 mg by mouth daily and Humira 40 mg subcutaneous every other week. Her labs have been stable. I will advised to get labs every 3 months. We will refill her Humira prescription.  Primary osteoarthritis of both feet: She continues to have some stiffness but there was no synovitis on examination.  Primary insomnia: Good sleep hygiene discussed.  Her other medical  problems are listed as follows:  Former smoker  History of migraine  History of hypothyroidism  History of anxiety    Orders: No orders of the defined types were placed in this encounter.  No orders of the defined types were placed in this encounter.   Face-to-face time spent with patient was 30 minutes.Greater than 50% of time was spent in counseling and coordination of care.  Follow-Up Instructions: Return in about 5 months (around 09/06/2017) for Rheumatoid arthritis, Osteoarthritis.   Bo Merino, MD  Note - This record has been created using Editor, commissioning.  Chart creation errors have been sought, but may not always  have been located. Such creation errors do not reflect on  the standard of medical care.

## 2017-04-02 MED FILL — traZODone HCL 50 MG TABS: 50 | 90 days supply | Qty: 90 | Fill #0

## 2017-04-06 ENCOUNTER — Encounter: Payer: Self-pay | Admitting: Rheumatology

## 2017-04-06 ENCOUNTER — Other Ambulatory Visit: Payer: Self-pay | Admitting: Pharmacist

## 2017-04-06 ENCOUNTER — Telehealth: Payer: Self-pay

## 2017-04-06 ENCOUNTER — Ambulatory Visit (INDEPENDENT_AMBULATORY_CARE_PROVIDER_SITE_OTHER): Payer: 59 | Admitting: Rheumatology

## 2017-04-06 VITALS — BP 120/72 | Resp 16 | Ht 63.5 in | Wt 162.0 lb

## 2017-04-06 DIAGNOSIS — Z8659 Personal history of other mental and behavioral disorders: Secondary | ICD-10-CM | POA: Diagnosis not present

## 2017-04-06 DIAGNOSIS — M19072 Primary osteoarthritis, left ankle and foot: Secondary | ICD-10-CM | POA: Diagnosis not present

## 2017-04-06 DIAGNOSIS — Z8639 Personal history of other endocrine, nutritional and metabolic disease: Secondary | ICD-10-CM | POA: Diagnosis not present

## 2017-04-06 DIAGNOSIS — Z79899 Other long term (current) drug therapy: Secondary | ICD-10-CM | POA: Diagnosis not present

## 2017-04-06 DIAGNOSIS — M0579 Rheumatoid arthritis with rheumatoid factor of multiple sites without organ or systems involvement: Secondary | ICD-10-CM | POA: Diagnosis not present

## 2017-04-06 DIAGNOSIS — Z87891 Personal history of nicotine dependence: Secondary | ICD-10-CM | POA: Diagnosis not present

## 2017-04-06 DIAGNOSIS — Z8669 Personal history of other diseases of the nervous system and sense organs: Secondary | ICD-10-CM | POA: Diagnosis not present

## 2017-04-06 DIAGNOSIS — M19071 Primary osteoarthritis, right ankle and foot: Secondary | ICD-10-CM | POA: Diagnosis not present

## 2017-04-06 DIAGNOSIS — F5101 Primary insomnia: Secondary | ICD-10-CM | POA: Diagnosis not present

## 2017-04-06 MED ORDER — ADALIMUMAB 40 MG/0.8ML ~~LOC~~ AJKT: 40.0000 mg | AUTO-INJECTOR | SUBCUTANEOUS | 2 refills | Status: DC

## 2017-04-06 MED FILL — AMITRIPTYLINE HCL 50 MG TAB: 50 | 90 days supply | Qty: 90 | Fill #0

## 2017-04-06 MED FILL — HUMIRA PEN 40 MG/0.8ML PNKT: 40 | 28 days supply | Qty: 2 | Fill #0

## 2017-04-06 MED FILL — SUMATRIPTAN SUCC 50 MG TABL: 50 | 30 days supply | Qty: 12 | Fill #0

## 2017-04-06 NOTE — Telephone Encounter (Signed)
Spoke to patient in clinic about her refill for [place] at [facility]. Patient is due for a refill and needs a new Rx Sent to the pharmacy.   Will you send a new Rx for HUMIRA to [facility]?   Thanks,  Courtney Leonard, [place], CPhT 11:10 AM

## 2017-04-06 NOTE — Patient Instructions (Signed)
Standing Labs We placed an order today for your standing lab work.    Please come back and get your standing labs in November and every 3 months  We have open lab Monday through Friday from 8:30-11:30 AM and 1:30-4 PM at the office of Dr. Ilee Randleman.   The office is located at [address], [address], [place], [place] [address] No appointment is necessary.   Labs are drawn by [facility].  You may receive a bill from [facility] for your lab work. If you have any questions regarding directions or hours of operation,  please call [phone].    

## 2017-04-06 NOTE — Telephone Encounter (Signed)
Last Visit: 04/06/17 Next Visit: 09/10/17 Labs: 03/04/17 WNL TB Gold: 03/04/17 Neg  Okay to refill per Dr. Estanislado Pandy

## 2017-04-16 NOTE — Progress Notes (Signed)
Assessment / Plan:     Visit Diagnoses: Rheumatoid arthritis involving multiple sites with positive rheumatoid factor ([place]): Patient has no active synovitis on examination although she complains of arthralgias. In my opinion she had very good response to Humira area and  High risk medication use - Methotrexate 0.8 ML subcutaneous every week, folic acid 2 mg by mouth daily and Humira 40 mg subcutaneous every other week. Her labs have been stable. I will advised to get labs every 3 months. We will refill her Humira prescription.  Primary osteoarthritis of both feet: She continues to have some stiffness but there was no synovitis on examination.  Primary insomnia: Good sleep hygiene discussed.  Her other medical problems are listed as follows:  Former smoker  History of migraine  History of hypothyroidism

## 2017-04-20 MED FILL — VENLAFAXINE HCL ER 150 MG C: 150 | 90 days supply | Qty: 90 | Fill #1

## 2017-04-21 ENCOUNTER — Inpatient Hospital Stay (INDEPENDENT_AMBULATORY_CARE_PROVIDER_SITE_OTHER): Payer: Self-pay

## 2017-04-21 ENCOUNTER — Ambulatory Visit (INDEPENDENT_AMBULATORY_CARE_PROVIDER_SITE_OTHER): Payer: 59 | Admitting: Rheumatology

## 2017-04-21 DIAGNOSIS — M79641 Pain in right hand: Secondary | ICD-10-CM

## 2017-04-21 DIAGNOSIS — M79642 Pain in left hand: Secondary | ICD-10-CM | POA: Diagnosis not present

## 2017-04-23 DIAGNOSIS — L905 Scar conditions and fibrosis of skin: Secondary | ICD-10-CM | POA: Diagnosis not present

## 2017-04-30 MED FILL — HUMIRA PEN 40 MG/0.8ML PNKT: 40 | 28 days supply | Qty: 2 | Fill #1

## 2017-05-05 DIAGNOSIS — L812 Freckles: Secondary | ICD-10-CM | POA: Diagnosis not present

## 2017-05-05 DIAGNOSIS — D1801 Hemangioma of skin and subcutaneous tissue: Secondary | ICD-10-CM | POA: Diagnosis not present

## 2017-05-05 DIAGNOSIS — D692 Other nonthrombocytopenic purpura: Secondary | ICD-10-CM | POA: Diagnosis not present

## 2017-05-05 DIAGNOSIS — L821 Other seborrheic keratosis: Secondary | ICD-10-CM | POA: Diagnosis not present

## 2017-05-05 DIAGNOSIS — L814 Other melanin hyperpigmentation: Secondary | ICD-10-CM | POA: Diagnosis not present

## 2017-05-05 DIAGNOSIS — L570 Actinic keratosis: Secondary | ICD-10-CM | POA: Diagnosis not present

## 2017-05-05 DIAGNOSIS — D225 Melanocytic nevi of trunk: Secondary | ICD-10-CM | POA: Diagnosis not present

## 2017-05-18 MED FILL — LEVOTHYROXINE 75 MCG TABLET: 75 | 90 days supply | Qty: 90 | Fill #1

## 2017-05-18 MED FILL — TOPIRAMATE 100 MG TABLET: 100 | 90 days supply | Qty: 180 | Fill #1

## 2017-06-07 MED FILL — HUMIRA PEN 40 MG/0.8ML PNKT: 40 | 28 days supply | Qty: 2 | Fill #2

## 2017-06-10 ENCOUNTER — Other Ambulatory Visit: Payer: Self-pay | Admitting: Rheumatology

## 2017-06-10 MED FILL — BD ALLERGY SYRINGE 1 ML 28G: 28G X 1/2" | 84 days supply | Qty: 12 | Fill #1

## 2017-06-10 MED FILL — METHOTREXATE 25 MG/ML VIAL: 50 | 27 days supply | Qty: 4 | Fill #0

## 2017-06-10 NOTE — Telephone Encounter (Signed)
Last Visit: 04/06/17 Next Visit due in February 2019. Message sent to the front to schedule patient.  Labs: 03/04/17 WNL  Left message to remind patient she is due for labs.   Okay to refill 30 day supply per Dr. Estanislado Pandy.

## 2017-06-21 ENCOUNTER — Other Ambulatory Visit: Payer: Self-pay

## 2017-06-21 DIAGNOSIS — Z79899 Other long term (current) drug therapy: Secondary | ICD-10-CM

## 2017-06-22 LAB — COMPLETE METABOLIC PANEL WITH GFR
AG Ratio: 2 (calc) (ref 1.0–2.5)
ALKALINE PHOSPHATASE (APISO): 78 U/L (ref 33–130)
ALT: 21 U/L (ref 6–29)
AST: 28 U/L (ref 10–35)
Albumin: 4.8 g/dL (ref 3.6–5.1)
BUN: 15 mg/dL (ref 7–25)
CHLORIDE: 106 mmol/L (ref 98–110)
CO2: 20 mmol/L (ref 20–32)
CREATININE: 0.86 mg/dL (ref 0.50–1.05)
Calcium: 9.2 mg/dL (ref 8.6–10.4)
GFR, Est African American: 91 mL/min/{1.73_m2} (ref >=60)
GFR, Est Non African American: 79 mL/min/{1.73_m2} (ref >=60)
GLOBULIN: 2.4 g/dL (ref 1.9–3.7)
GLUCOSE: 78 mg/dL (ref 65–99)
Potassium: 4.3 mmol/L (ref 3.5–5.3)
SODIUM: 138 mmol/L (ref 135–146)
Total Bilirubin: 0.3 mg/dL (ref 0.2–1.2)
Total Protein: 7.2 g/dL (ref 6.1–8.1)

## 2017-06-22 LAB — CBC WITH DIFFERENTIAL/PLATELET

## 2017-07-02 ENCOUNTER — Other Ambulatory Visit: Payer: Self-pay | Admitting: Rheumatology

## 2017-07-02 MED FILL — FOLIC ACID 1 MG TABLET: 1 | 90 days supply | Qty: 180 | Fill #1

## 2017-07-02 NOTE — Telephone Encounter (Signed)
Last Visit: 04/06/17 Next Visit due in February 2019. Message sent to the front to schedule patient.  Labs:06/21/17 WNL TB Gold: 03/04/17 Neg   Okay to refill per Dr. Estanislado Pandy

## 2017-07-05 ENCOUNTER — Other Ambulatory Visit: Payer: Self-pay | Admitting: Pharmacist

## 2017-07-05 MED ORDER — ADALIMUMAB 40 MG/0.8ML ~~LOC~~ AJKT: 40.0000 mg | AUTO-INJECTOR | SUBCUTANEOUS | 2 refills | Status: DC

## 2017-07-06 DIAGNOSIS — N951 Menopausal and female climacteric states: Secondary | ICD-10-CM | POA: Diagnosis not present

## 2017-07-06 DIAGNOSIS — F419 Anxiety disorder, unspecified: Secondary | ICD-10-CM | POA: Diagnosis not present

## 2017-07-06 DIAGNOSIS — F5109 Other insomnia not due to a substance or known physiological condition: Secondary | ICD-10-CM | POA: Diagnosis not present

## 2017-07-06 MED FILL — VENLAFAXINE HCL ER 75 MG CA: 75 | 90 days supply | Qty: 270 | Fill #0

## 2017-07-06 MED FILL — traZODone HCL 50 MG TABS: 50 | 90 days supply | Qty: 90 | Fill #0

## 2017-07-06 MED FILL — ALPRAZolam 0.5 MG TABS: 0.5 | 30 days supply | Qty: 30 | Fill #0

## 2017-07-07 ENCOUNTER — Telehealth: Payer: Self-pay

## 2017-07-07 NOTE — Telephone Encounter (Addendum)
Received a message from pharmacists from [facility] stating that patients prior authorization for HUMIRA has expired. Authorization has been submitted and is being processed. We will have an answer within 24 to 72 hours. Patient is due for a refill on Friday, December 7th. Can we give patient a sample? Thanks!  Phone: [phone] Fax: [phone] Authorization number: [id number]  Maybelle Depaoli, [place], CPhT 1:44 PM

## 2017-07-07 NOTE — Telephone Encounter (Signed)
Patient called back and I informed that she will have to come pick up the medication. Patient stated that she will try and come by tomorrow.  Thank you.

## 2017-07-07 NOTE — Telephone Encounter (Signed)
Last Visit: 04/06/17 Next Visit: due in February 2019. Message sent to the front to schedule patient.  Labs: 06/21/17 WNL TB Gold: 03/04/17 Neg  Okay to give sample

## 2017-07-07 NOTE — Telephone Encounter (Signed)
Called patient to inform her that we could give a sample. Patient did not answer her cell phone. Voicemail was full. Did not answer home phone and could not leave a message. Will try again.   Adebayo Ensminger, [place], CPhT 3:58 PM

## 2017-07-08 ENCOUNTER — Telehealth: Payer: Self-pay | Admitting: Rheumatology

## 2017-07-08 ENCOUNTER — Other Ambulatory Visit: Payer: Self-pay

## 2017-07-08 ENCOUNTER — Telehealth: Payer: Self-pay | Admitting: *Deleted

## 2017-07-08 DIAGNOSIS — Z79899 Other long term (current) drug therapy: Secondary | ICD-10-CM | POA: Diagnosis not present

## 2017-07-08 LAB — CBC WITH DIFFERENTIAL/PLATELET
BASOS PCT: 1 %
Basophils Absolute: 41 cells/uL (ref 0–200)
EOS ABS: 49 {cells}/uL (ref 15–500)
Eosinophils Relative: 1.2 %
HCT: 33.2 % — ABNORMAL LOW (ref 35.0–45.0)
HEMOGLOBIN: 11.6 g/dL — AB (ref 11.7–15.5)
Lymphs Abs: 1525 cells/uL (ref 850–3900)
MCH: 34 pg — AB (ref 27.0–33.0)
MCHC: 34.9 g/dL (ref 32.0–36.0)
MCV: 97.4 fL (ref 80.0–100.0)
MONOS PCT: 6.3 %
MPV: 10.2 fL (ref 7.5–12.5)
NEUTROS ABS: 2226 {cells}/uL (ref 1500–7800)
Neutrophils Relative %: 54.3 %
PLATELETS: 217 10*3/uL (ref 140–400)
RBC: 3.41 10*6/uL — AB (ref 3.80–5.10)
RDW: 13.4 % (ref 11.0–15.0)
Total Lymphocyte: 37.2 %
WBC: 4.1 10*3/uL (ref 3.8–10.8)
WBCMIX: 258 {cells}/uL (ref 200–950)

## 2017-07-08 LAB — COMPLETE METABOLIC PANEL WITH GFR
AG Ratio: 1.8 (calc) (ref 1.0–2.5)
ALBUMIN MSPROF: 4.3 g/dL (ref 3.6–5.1)
ALT: 28 U/L (ref 6–29)
AST: 18 U/L (ref 10–35)
Alkaline phosphatase (APISO): 78 U/L (ref 33–130)
BILIRUBIN TOTAL: 0.2 mg/dL (ref 0.2–1.2)
BUN: 16 mg/dL (ref 7–25)
CALCIUM: 9 mg/dL (ref 8.6–10.4)
CO2: 23 mmol/L (ref 20–32)
Chloride: 107 mmol/L (ref 98–110)
Creat: 0.88 mg/dL (ref 0.50–1.05)
GFR, EST AFRICAN AMERICAN: 89 mL/min/{1.73_m2} (ref >=60)
GFR, EST NON AFRICAN AMERICAN: 77 mL/min/{1.73_m2} (ref >=60)
GLUCOSE: 95 mg/dL (ref 65–99)
Globulin: 2.4 g/dL (calc) (ref 1.9–3.7)
Potassium: 3.5 mmol/L (ref 3.5–5.3)
Sodium: 139 mmol/L (ref 135–146)
TOTAL PROTEIN: 6.7 g/dL (ref 6.1–8.1)

## 2017-07-08 MED ORDER — METHOTREXATE SODIUM CHEMO INJECTION 50 MG/2ML: 20.0000 mg | INTRAMUSCULAR | 0 refills | Status: DC

## 2017-07-08 MED FILL — METHOTREXATE 25 MG/ML VIAL: 50 | 30 days supply | Qty: 10 | Fill #0

## 2017-07-08 NOTE — Telephone Encounter (Signed)
Last Visit: 04/06/17 Next Visit due in February 2019. Message sent to the front to schedule patient.  Labs:06/21/17 WNL Okay to refill per Dr. Estanislado Pandy

## 2017-07-08 NOTE — Telephone Encounter (Signed)
Medication Samples have been provided to the patient.  Drug name: Humira      Strength: 40 mg/0.4 mL       Qty: 1  LOT: [id number]  Exp.Date: 09/2018  Dosing instructions: Inject 1 pen every week.  The patient has been instructed regarding the correct time, dose, and frequency of taking this medication, including desired effects and most common side effects.   Gwenlyn Perking 3:03 PM 07/08/2017

## 2017-07-08 NOTE — Telephone Encounter (Signed)
Patient had labs today, and requested refill on MTX to be called in.

## 2017-07-09 NOTE — Telephone Encounter (Signed)
Received a fax from [facility] regarding a prior authorization approval for HUMIRA from 07/07/2017 to 07/06/2018.   Reference number:[id number] Phone number:[phone]  Will send document to scan center.  Called patient to update. Mailbox was full. Could not leave a message.   Jahdiel Krol, [place], CPhT 1:00 PM

## 2017-07-09 NOTE — Progress Notes (Signed)
Mild anemia. We will continue to monitor.

## 2017-07-14 ENCOUNTER — Telehealth: Payer: Self-pay

## 2017-07-14 NOTE — Telephone Encounter (Signed)
Received a message from [facility] regarding pts Rx for Humira. PharmD states that the pt is expecting the Citrate Free from of Humira. The Rx that was sent for the non-citrate free. Please send in new rx for Humira 40mg /0.70ml citrate free to [facility]. Thanks!  Keegen Heffern, [place], CPhT 11:37 AM

## 2017-07-15 MED ORDER — ADALIMUMAB 40 MG/0.4ML ~~LOC~~ AJKT: 40.0000 mg | AUTO-INJECTOR | SUBCUTANEOUS | 2 refills | Status: DC

## 2017-07-15 NOTE — Telephone Encounter (Signed)
Prescription resent for the Humira Citrate Free.

## 2017-07-16 ENCOUNTER — Other Ambulatory Visit: Payer: Self-pay | Admitting: Pharmacist

## 2017-07-16 MED ORDER — ADALIMUMAB 40 MG/0.4ML ~~LOC~~ AJKT: 40.0000 mg | AUTO-INJECTOR | SUBCUTANEOUS | 2 refills | Status: DC

## 2017-07-20 NOTE — Telephone Encounter (Signed)
Received a notification from pharmacy stating that the Humira 40mg /0.24ml CF will require a prior authorization. Patient currently has the 40mg . 0.58ml authorized. Called [employer] to verify. Spoke with Laverna Peace who states that the pt will require a PA. Authorization was submitted over the phone as an urgent request. We will be notified by fax within 24 hours.  Will update once we receive a response.    Kiosha Buchan, [place], CPhT 1:00 PM

## 2017-07-21 MED FILL — HUMIRA PEN 40 MG/0.4ML PNKT: 40 | 28 days supply | Qty: 2 | Fill #0

## 2017-07-21 NOTE — Telephone Encounter (Signed)
Received a fax from [facility] regarding a prior authorization approval for HUMIRA (CF) 40MG /0.4ML from 07/20/2017 to 07/19/2018.   Reference number:[id number] Phone number: [phone]  Will send document to scan center.  Rose Hegner, [place], CPhT  10:17 AM

## 2017-07-22 ENCOUNTER — Telehealth: Payer: Self-pay | Admitting: Rheumatology

## 2017-07-22 NOTE — Telephone Encounter (Signed)
I tried to contact patient to schedule rov, but mailbox was full. Unable to leave a message.

## 2017-07-22 NOTE — Telephone Encounter (Signed)
-----   Message from Carole Binning, LPN sent at 30/08/3141  2:56 PM EST ----- Regarding: Please schedule patient for a follow up visit Please schedule patient for a follow up visit. Patient due February 2019. Thanks!

## 2017-07-23 MED FILL — AMITRIPTYLINE HCL 50 MG TAB: 50 | 90 days supply | Qty: 90 | Fill #1

## 2017-08-16 DIAGNOSIS — G43709 Chronic migraine without aura, not intractable, without status migrainosus: Secondary | ICD-10-CM | POA: Insufficient documentation

## 2017-08-16 DIAGNOSIS — F5109 Other insomnia not due to a substance or known physiological condition: Secondary | ICD-10-CM | POA: Diagnosis not present

## 2017-08-16 DIAGNOSIS — M069 Rheumatoid arthritis, unspecified: Secondary | ICD-10-CM | POA: Diagnosis not present

## 2017-08-17 MED FILL — HUMIRA PEN 40 MG/0.4ML PNKT: 40 | 28 days supply | Qty: 2 | Fill #1

## 2017-08-17 MED FILL — SHIPPING COST: 1 days supply | Qty: 1 | Fill #0

## 2017-08-30 MED FILL — LEVOTHYROXINE 75 MCG TABLET: 75 | 90 days supply | Qty: 90 | Fill #2

## 2017-08-30 MED FILL — SUMATRIPTAN SUCC 50 MG TAB: 50 | 30 days supply | Qty: 12 | Fill #1

## 2017-08-31 MED FILL — TOPIRAMATE 100 MG TABLET: 100 | 90 days supply | Qty: 180 | Fill #0

## 2017-09-10 ENCOUNTER — Ambulatory Visit: Payer: 59 | Admitting: Rheumatology

## 2017-09-15 ENCOUNTER — Ambulatory Visit: Payer: 59 | Admitting: Rheumatology

## 2017-09-15 MED FILL — HUMIRA PEN 40 MG/0.4ML PNKT: 40 | 28 days supply | Qty: 2 | Fill #2

## 2017-09-15 MED FILL — SHIPPING COST: 1 days supply | Qty: 1 | Fill #1

## 2017-09-24 MED FILL — BD ALLERGY SYRINGE 1 ML 28G: 28G X 1/2" | 84 days supply | Qty: 12 | Fill #2

## 2017-10-07 ENCOUNTER — Other Ambulatory Visit: Payer: Self-pay | Admitting: Rheumatology

## 2017-10-07 ENCOUNTER — Other Ambulatory Visit: Payer: Self-pay | Admitting: Pharmacist

## 2017-10-07 MED ORDER — ADALIMUMAB 40 MG/0.4ML ~~LOC~~ AJKT: 40.0000 mg | AUTO-INJECTOR | SUBCUTANEOUS | 0 refills | Status: DC

## 2017-10-07 NOTE — Telephone Encounter (Signed)
Last Visit: 04/06/17 Next Visit due in February 2019. Left message for patient to call the office to schedule an appointment.  Labs:07/08/17 Mild anemia TB Gold: 03/04/17 Neg   Okay to refill 30 day supply per Dr. Estanislado Pandy

## 2017-10-15 MED FILL — FOLIC ACID 1 MG TABS: 1 | 90 days supply | Qty: 180 | Fill #2

## 2017-10-15 MED FILL — HUMIRA PEN 40 MG/0.4ML PNKT: 40 | 28 days supply | Qty: 2 | Fill #0

## 2017-10-29 ENCOUNTER — Other Ambulatory Visit: Payer: Self-pay

## 2017-10-29 ENCOUNTER — Other Ambulatory Visit: Payer: Self-pay | Admitting: *Deleted

## 2017-10-29 DIAGNOSIS — Z79899 Other long term (current) drug therapy: Secondary | ICD-10-CM | POA: Diagnosis not present

## 2017-10-29 LAB — COMPLETE METABOLIC PANEL WITH GFR
AG RATIO: 1.6 (calc) (ref 1.0–2.5)
ALBUMIN MSPROF: 4.6 g/dL (ref 3.6–5.1)
ALKALINE PHOSPHATASE (APISO): 88 U/L (ref 33–130)
ALT: 27 U/L (ref 6–29)
AST: 27 U/L (ref 10–35)
BILIRUBIN TOTAL: 0.4 mg/dL (ref 0.2–1.2)
BUN: 17 mg/dL (ref 7–25)
CHLORIDE: 108 mmol/L (ref 98–110)
CO2: 23 mmol/L (ref 20–32)
Calcium: 9.3 mg/dL (ref 8.6–10.4)
Creat: 0.92 mg/dL (ref 0.50–1.05)
GFR, EST AFRICAN AMERICAN: 84 mL/min/{1.73_m2} (ref >=60)
GFR, Est Non African American: 73 mL/min/{1.73_m2} (ref >=60)
Globulin: 2.8 g/dL (calc) (ref 1.9–3.7)
Glucose, Bld: 84 mg/dL (ref 65–99)
POTASSIUM: 3.6 mmol/L (ref 3.5–5.3)
Sodium: 140 mmol/L (ref 135–146)
Total Protein: 7.4 g/dL (ref 6.1–8.1)

## 2017-10-29 LAB — CBC WITH DIFFERENTIAL/PLATELET
BASOS ABS: 41 {cells}/uL (ref 0–200)
Basophils Relative: 0.7 %
Eosinophils Absolute: 41 cells/uL (ref 15–500)
Eosinophils Relative: 0.7 %
HEMATOCRIT: 38.4 % (ref 35.0–45.0)
Hemoglobin: 13.3 g/dL (ref 11.7–15.5)
LYMPHS ABS: 1844 {cells}/uL (ref 850–3900)
MCH: 32.9 pg (ref 27.0–33.0)
MCHC: 34.6 g/dL (ref 32.0–36.0)
MCV: 95 fL (ref 80.0–100.0)
MONOS PCT: 7.9 %
MPV: 10.3 fL (ref 7.5–12.5)
NEUTROS PCT: 58.9 %
Neutro Abs: 3416 cells/uL (ref 1500–7800)
PLATELETS: 238 10*3/uL (ref 140–400)
RBC: 4.04 10*6/uL (ref 3.80–5.10)
RDW: 12.8 % (ref 11.0–15.0)
TOTAL LYMPHOCYTE: 31.8 %
WBC mixed population: 458 cells/uL (ref 200–950)
WBC: 5.8 10*3/uL (ref 3.8–10.8)

## 2017-10-29 MED FILL — traZODone HCL 50 MG TABS: 50 | 90 days supply | Qty: 90 | Fill #1

## 2017-10-29 MED FILL — VENLAFAXINE HCL ER 75 MG CA: 75 | 90 days supply | Qty: 270 | Fill #1

## 2017-10-29 NOTE — Progress Notes (Signed)
Office Visit Note  Patient: Courtney Leonard             Date of Birth: 10/05/1965           MRN: [id number]             PCP: Deborah Chalk, FNP Referring: Deborah Chalk, FNP Visit Date: 11/11/2017 Occupation: @GUAROCC @    Subjective:  Discuss medication   History of Present Illness: Courtney Leonard is a 52 y.o. female with history of seropositive rheumatoid arthritis and osteoarthritis.  Patient states she continues to inject Humira every other week.  She states she missed her methotrexate dose last week due to not wanting to use the vial and syringe anymore.  She would like to switch to the oral tablets.  She denies any recent flares of her rheumatoid arthritis.  She denies any joint pain or joint swelling at this time.  She states that she has occasional hand swelling and hand stiffness first thing in the morning but it usually dissipates within 30 minutes.  She denies any pain or joint swelling in any other joints.  She states that she will have some increased joint stiffness if she is sitting for prolonged periods of time.   Activities of Daily Living:  Patient reports morning stiffness for 30 minutes.   Patient Reports nocturnal pain.  Difficulty dressing/grooming: Denies Difficulty climbing stairs: Reports Difficulty getting out of chair: Denies Difficulty using hands for taps, buttons, cutlery, and/or writing: Reports   Review of Systems  Constitutional: Negative for activity change and fatigue.  HENT: Negative for mouth sores, mouth dryness and nose dryness.   Eyes: Negative for pain, visual disturbance and dryness.  Respiratory: Negative for cough, hemoptysis, shortness of breath and difficulty breathing.   Cardiovascular: Negative for chest pain, palpitations, hypertension and swelling in legs/feet.  Gastrointestinal: Negative for blood in stool, constipation and diarrhea.  Endocrine: Negative for increased urination.  Genitourinary: Negative for difficulty  urinating and painful urination.  Musculoskeletal: Positive for arthralgias, joint pain, joint swelling and morning stiffness. Negative for myalgias, muscle weakness, muscle tenderness and myalgias.  Skin: Positive for rash. Negative for color change, pallor, hair loss, nodules/bumps, skin tightness, ulcers and sensitivity to sunlight.  Allergic/Immunologic: Negative for susceptible to infections.  Neurological: Negative for dizziness, headaches and weakness.  Hematological: Negative for bruising/bleeding tendency and swollen glands.  Psychiatric/Behavioral: Negative for depressed mood and sleep disturbance. The patient is not nervous/anxious.     PMFS History:  Patient Active Problem List   Diagnosis Date Noted  . Rheumatoid arthritis involving multiple sites with positive rheumatoid factor ([place]) 06/13/2016  . High risk medication use 06/13/2016  . Primary osteoarthritis of both feet 06/13/2016  . Insomnia 06/13/2016  . Acquired hypothyroidism 06/13/2016  . Migraine without status migrainosus, not intractable 06/13/2016  . Former smoker 06/13/2016  . Anxiety 01/08/2011    Past Medical History:  Diagnosis Date  . Anal fissure   . Anxiety   . Arthritis   . Depression   . Hypothyroidism   . Insomnia   . Migraine   . Osteoarthritis   . Rheumatoid arthritis ([place])   . Thyroid disease     Family History  Problem Relation Age of Onset  . Diabetes Mother   . Hypertension Mother   . Atrial fibrillation Father   . Prostate cancer Father    Past Surgical History:  Procedure Laterality Date  . ABDOMINAL HYSTERECTOMY    . CESAREAN SECTION    .  HEMORRHOID SURGERY    . KNEE ARTHROPLASTY    . KNEE SURGERY     RT knee  . laproscopy    . ROBOTIC ASSISTED SALPINGO OOPHERECTOMY Left 09/12/2014   Procedure: ROBOTIC ASSISTED Left SALPINGO OOPHORECTOMY/Right Salpingectomy/Pelvic Washings;  Surgeon: Princess Bruins, MD;  Location: [facility];  Service: Gynecology;  Laterality: Left;    Social History   Social History Narrative  . Not on file     Objective: Vital Signs: BP 100/65 (BP Location: Left Arm, Patient Position: Sitting, Cuff Size: Normal)   Pulse 80   Resp 14   Ht 5' 3.5" (1.613 m)   Wt 166 lb (75.3 kg)   BMI 28.94 kg/m    Physical Exam  Constitutional: She is oriented to person, place, and time. She appears well-developed and well-nourished.  HENT:  Head: Normocephalic and atraumatic.  Eyes: Conjunctivae and EOM are normal.  Neck: Normal range of motion.  Cardiovascular: Normal rate, regular rhythm, normal heart sounds and intact distal pulses.  Pulmonary/Chest: Effort normal and breath sounds normal.  Abdominal: Soft. Bowel sounds are normal.  Lymphadenopathy:    She has no cervical adenopathy.  Neurological: She is alert and oriented to person, place, and time.  Skin: Skin is warm and dry. Capillary refill takes less than 2 seconds.  Psychiatric: She has a normal mood and affect. Her behavior is normal.  Nursing note and vitals reviewed.    Musculoskeletal Exam: C-spine, thoracic spine, lumbar spine good range of motion.  No midline spinal tenderness.  No SI joint tenderness.  Shoulder joints, elbow joints, wrist joints, MCPs, PIPs, DIPs good range of motion with no synovitis.  No tenderness of her MCPs.  Hip joints, knee joints, ankle joints, MTPs, PIPs, DIPs good range of motion with no synovitis.  She has bilateral knee crepitus.  No warmth or effusion of bilateral knees.  No tenderness of trochanteric bursa.  CDAI Exam: CDAI Homunculus Exam:   Joint Counts:  CDAI Tender Joint count: 0 CDAI Swollen Joint count: 0  Global Assessments:  Patient Global Assessment: 0 Provider Global Assessment: 1    Investigation: No additional findings.TB Gold: 03/04/2017 Negative  CBC Latest Ref Rng & Units 10/29/2017 07/08/2017 06/21/2017  WBC 3.8 - 10.8 Thousand/uL 5.8 4.1 CANCELED  Hemoglobin 11.7 - 15.5 g/dL 13.3 11.6(L) -  Hematocrit 35.0 -  45.0 % 38.4 33.2(L) -  Platelets 140 - 400 Thousand/uL 238 217 -   CMP Latest Ref Rng & Units 10/29/2017 07/08/2017 06/21/2017  Glucose 65 - 99 mg/dL 84 95 78  BUN 7 - 25 mg/dL 17 16 15   Creatinine 0.50 - 1.05 mg/dL 0.92 0.88 0.86  Sodium 135 - 146 mmol/L 140 139 138  Potassium 3.5 - 5.3 mmol/L 3.6 3.5 4.3  Chloride 98 - 110 mmol/L 108 107 106  CO2 20 - 32 mmol/L 23 23 20   Calcium 8.6 - 10.4 mg/dL 9.3 9.0 9.2  Total Protein 6.1 - 8.1 g/dL 7.4 6.7 7.2  Total Bilirubin 0.2 - 1.2 mg/dL 0.4 0.2 0.3  Alkaline Phos 33 - 130 U/L - - -  AST 10 - 35 U/L 27 18 28   ALT 6 - 29 U/L 27 28 21     Imaging: No results found.  Speciality Comments: No specialty comments available.    Procedures:  No procedures performed Allergies: Sulfur   Assessment / Plan:     Visit Diagnoses: Rheumatoid arthritis involving multiple sites with positive rheumatoid factor ([place]): She has no synovitis on exam today.  She has not had any recent rheumatoid arthritis flares.  She is clinically been doing well on Humira every other week, methotrexate 0.8 mL weekly and folic acid 2 mg daily.  She would like to switch to the oral tablets and discontinue the subcutaneous injection of methotrexate.  High risk medication use - Methotrexate 0.8 ML subcutaneous every week, folic acid 2 mg by mouth daily and Humira 40 mg subcutaneous every other week.  Primary osteoarthritis of both feet: She has no discomfort at this time.  She wears proper fitting shoes.    Other medical conditions are listed as follows:   Primary insomnia  History of anxiety  History of migraine  History of hypothyroidism  Former smoker    Orders: No orders of the defined types were placed in this encounter.  Meds ordered this encounter  Medications  . methotrexate (RHEUMATREX) 2.5 MG tablet    Sig: Take 8 tablets by mouth once weekly.  Caution:Chemotherapy. Protect from light.    Dispense:  32 tablet    Refill:  2     Follow-Up  Instructions: Return in about 5 months (around 04/13/2018) for Rheumatoid arthritis, Osteoarthritis.   Hazel Sams PA-C I examined and evaluated the patient with Hazel Sams PA.  She diagnosed synovitis on examination today.  She will switch from subcu methotrexate to oral methotrexate.  We also discussed that the next step could be spacing her Humira dosing if she continues to do well.  The plan of care was discussed as noted above.  Bo Merino, MD Note - This record has been created using Editor, commissioning.  Chart creation errors have been sought, but may not always  have been located. Such creation errors do not reflect on  the standard of medical care.

## 2017-11-01 MED FILL — AMITRIPTYLINE HCL 50 MG TAB: 50 | 90 days supply | Qty: 90 | Fill #0

## 2017-11-08 ENCOUNTER — Other Ambulatory Visit: Payer: Self-pay | Admitting: Pharmacist

## 2017-11-08 ENCOUNTER — Other Ambulatory Visit: Payer: Self-pay | Admitting: Internal Medicine

## 2017-11-08 ENCOUNTER — Other Ambulatory Visit: Payer: Self-pay | Admitting: Rheumatology

## 2017-11-08 MED ORDER — ADALIMUMAB 40 MG/0.4ML ~~LOC~~ AJKT: 40.0000 mg | AUTO-INJECTOR | SUBCUTANEOUS | 2 refills | Status: DC

## 2017-11-08 MED FILL — HUMIRA PEN 40 MG/0.4ML PNKT: 40 | 28 days supply | Qty: 2 | Fill #0

## 2017-11-08 NOTE — Telephone Encounter (Signed)
Last visit: 04/06/2017 Next visit: 11/11/2017 Labs: 10/29/2017 WNL  TB Gold: 03/04/2017 Negative   Okay to refill per Dr. Estanislado Pandy.

## 2017-11-11 ENCOUNTER — Encounter: Payer: Self-pay | Admitting: Rheumatology

## 2017-11-11 ENCOUNTER — Ambulatory Visit: Payer: 59 | Admitting: Rheumatology

## 2017-11-11 VITALS — BP 100/65 | HR 80 | Resp 14 | Ht 63.5 in | Wt 166.0 lb

## 2017-11-11 DIAGNOSIS — M19072 Primary osteoarthritis, left ankle and foot: Secondary | ICD-10-CM | POA: Diagnosis not present

## 2017-11-11 DIAGNOSIS — Z8669 Personal history of other diseases of the nervous system and sense organs: Secondary | ICD-10-CM | POA: Diagnosis not present

## 2017-11-11 DIAGNOSIS — F5101 Primary insomnia: Secondary | ICD-10-CM | POA: Diagnosis not present

## 2017-11-11 DIAGNOSIS — Z8639 Personal history of other endocrine, nutritional and metabolic disease: Secondary | ICD-10-CM | POA: Diagnosis not present

## 2017-11-11 DIAGNOSIS — Z8659 Personal history of other mental and behavioral disorders: Secondary | ICD-10-CM | POA: Diagnosis not present

## 2017-11-11 DIAGNOSIS — Z87891 Personal history of nicotine dependence: Secondary | ICD-10-CM | POA: Diagnosis not present

## 2017-11-11 DIAGNOSIS — M19071 Primary osteoarthritis, right ankle and foot: Secondary | ICD-10-CM

## 2017-11-11 DIAGNOSIS — Z79899 Other long term (current) drug therapy: Secondary | ICD-10-CM

## 2017-11-11 DIAGNOSIS — M0579 Rheumatoid arthritis with rheumatoid factor of multiple sites without organ or systems involvement: Secondary | ICD-10-CM

## 2017-11-11 MED ORDER — METHOTREXATE 2.5 MG PO TABS: ORAL_TABLET | ORAL | 2 refills | Status: DC

## 2017-11-11 MED FILL — METHOTREXATE SODIUM 2.5 MG: 2.5 | 30 days supply | Qty: 32 | Fill #0

## 2017-11-11 NOTE — Patient Instructions (Signed)
Standing Labs We placed an order today for your standing lab work.    Please come back and get your standing labs in June and every 3 months  We have open lab Monday through Friday from 8:30-11:30 AM and 1:30-4:00 PM  at the office of Dr. Shaili Deveshwar.   You may experience shorter wait times on Monday and Friday afternoons. The office is located at [address], [address], [place], [place] [address] No appointment is necessary.   Labs are drawn by [facility].  You may receive a bill from [facility] for your lab work. If you have any questions regarding directions or hours of operation,  please call [phone].    

## 2017-11-19 DIAGNOSIS — G43709 Chronic migraine without aura, not intractable, without status migrainosus: Secondary | ICD-10-CM | POA: Diagnosis not present

## 2017-11-19 DIAGNOSIS — M069 Rheumatoid arthritis, unspecified: Secondary | ICD-10-CM | POA: Diagnosis not present

## 2017-12-02 DIAGNOSIS — G43709 Chronic migraine without aura, not intractable, without status migrainosus: Secondary | ICD-10-CM | POA: Diagnosis not present

## 2017-12-06 MED FILL — LEVOTHYROXINE 75 MCG TABLET: 75 | 90 days supply | Qty: 90 | Fill #3

## 2017-12-06 MED FILL — SHIPPING COST: 1 days supply | Qty: 1 | Fill #2

## 2017-12-06 MED FILL — METHOTREXATE SODIUM 2.5 MG: 2.5 | 30 days supply | Qty: 32 | Fill #1

## 2017-12-06 MED FILL — TOPIRAMATE 100 MG TABLET: 100 | 90 days supply | Qty: 180 | Fill #1

## 2017-12-06 MED FILL — HUMIRA PEN 40 MG/0.4ML PNKT: 40 | 28 days supply | Qty: 2 | Fill #1

## 2018-01-06 ENCOUNTER — Other Ambulatory Visit: Payer: Self-pay

## 2018-01-06 ENCOUNTER — Other Ambulatory Visit: Payer: Self-pay | Admitting: *Deleted

## 2018-01-06 DIAGNOSIS — Z79899 Other long term (current) drug therapy: Secondary | ICD-10-CM | POA: Diagnosis not present

## 2018-01-06 DIAGNOSIS — Z9225 Personal history of immunosupression therapy: Secondary | ICD-10-CM

## 2018-01-06 MED FILL — METHOTREXATE SODIUM 2.5 MG: 2.5 | 30 days supply | Qty: 32 | Fill #2

## 2018-01-07 LAB — COMPLETE METABOLIC PANEL WITH GFR
AG Ratio: 1.8 (calc) (ref 1.0–2.5)
ALBUMIN MSPROF: 4.4 g/dL (ref 3.6–5.1)
ALT: 29 U/L (ref 6–29)
AST: 21 U/L (ref 10–35)
Alkaline phosphatase (APISO): 84 U/L (ref 33–130)
BUN: 17 mg/dL (ref 7–25)
CALCIUM: 9.1 mg/dL (ref 8.6–10.4)
CO2: 24 mmol/L (ref 20–32)
CREATININE: 0.89 mg/dL (ref 0.50–1.05)
Chloride: 109 mmol/L (ref 98–110)
GFR, EST NON AFRICAN AMERICAN: 76 mL/min/{1.73_m2} (ref >=60)
GFR, Est African American: 88 mL/min/{1.73_m2} (ref >=60)
GLOBULIN: 2.5 g/dL (ref 1.9–3.7)
GLUCOSE: 52 mg/dL — AB (ref 65–99)
Potassium: 4.2 mmol/L (ref 3.5–5.3)
SODIUM: 139 mmol/L (ref 135–146)
Total Bilirubin: 0.3 mg/dL (ref 0.2–1.2)
Total Protein: 6.9 g/dL (ref 6.1–8.1)

## 2018-01-07 LAB — CBC WITH DIFFERENTIAL/PLATELET
BASOS PCT: 1.1 %
Basophils Absolute: 50 cells/uL (ref 0–200)
EOS ABS: 59 {cells}/uL (ref 15–500)
Eosinophils Relative: 1.3 %
HEMATOCRIT: 37.3 % (ref 35.0–45.0)
Hemoglobin: 12.8 g/dL (ref 11.7–15.5)
LYMPHS ABS: 1260 {cells}/uL (ref 850–3900)
MCH: 33.2 pg — AB (ref 27.0–33.0)
MCHC: 34.3 g/dL (ref 32.0–36.0)
MCV: 96.9 fL (ref 80.0–100.0)
MPV: 10 fL (ref 7.5–12.5)
Monocytes Relative: 7.6 %
NEUTROS PCT: 62 %
Neutro Abs: 2790 cells/uL (ref 1500–7800)
Platelets: 237 10*3/uL (ref 140–400)
RBC: 3.85 10*6/uL (ref 3.80–5.10)
RDW: 13 % (ref 11.0–15.0)
Total Lymphocyte: 28 %
WBC: 4.5 10*3/uL (ref 3.8–10.8)
WBCMIX: 342 {cells}/uL (ref 200–950)

## 2018-01-10 MED FILL — HUMIRA PEN 40 MG/0.4ML PNKT: 40 | 28 days supply | Qty: 2 | Fill #2

## 2018-01-26 MED FILL — FOLIC ACID 1 MG TABS: 1 | 90 days supply | Qty: 180 | Fill #3

## 2018-02-01 ENCOUNTER — Other Ambulatory Visit: Payer: Self-pay | Admitting: Internal Medicine

## 2018-02-01 ENCOUNTER — Other Ambulatory Visit: Payer: Self-pay | Admitting: Rheumatology

## 2018-02-01 MED ORDER — ADALIMUMAB 40 MG/0.4ML ~~LOC~~ AJKT: 40.0000 mg | AUTO-INJECTOR | SUBCUTANEOUS | 2 refills | Status: DC

## 2018-02-01 MED FILL — HUMIRA PEN 40 MG/0.4ML PNKT: 40 | 28 days supply | Qty: 2 | Fill #0

## 2018-02-01 NOTE — Telephone Encounter (Signed)
Last Visit: 11/11/17 Next Visit: 04/13/18 Labs: 01/06/18 Glucose is 52. CBC Stable. TB Gold: 03/04/17 Neg   Okay to refill per Dr. Estanislado Pandy

## 2018-02-04 ENCOUNTER — Other Ambulatory Visit: Payer: Self-pay | Admitting: Physician Assistant

## 2018-02-04 MED FILL — METHOTREXATE SODIUM 2.5 MG: 2.5 | 28 days supply | Qty: 32 | Fill #0

## 2018-02-04 NOTE — Telephone Encounter (Signed)
Last Visit: 11/11/17 Next Visit: 04/13/18 Labs: 01/06/18 Glucose is 52. CBC Stable  Okay to refill per Dr. Estanislado Pandy

## 2018-02-04 NOTE — Telephone Encounter (Signed)
Patient needs a refill on MTX. Patient due tomorrow, and pharmacy is closed tomorrow.

## 2018-02-11 MED FILL — VENLAFAXINE HCL ER 75 MG CA: 75 | 30 days supply | Qty: 90 | Fill #0

## 2018-02-28 MED FILL — SHIPPING COST: 1 days supply | Qty: 1 | Fill #3

## 2018-02-28 MED FILL — HUMIRA PEN 40 MG/0.4ML PNKT: 40 | 28 days supply | Qty: 2 | Fill #1

## 2018-03-09 MED FILL — SUMATRIPTAN SUCC 50 MG TAB: 50 | 30 days supply | Qty: 12 | Fill #2

## 2018-03-09 MED FILL — METHOTREXATE SODIUM 2.5 MG: 2.5 | 28 days supply | Qty: 32 | Fill #1

## 2018-03-22 MED FILL — traZODone HCL 50 MG TABS: 50 | 35 days supply | Qty: 35 | Fill #0

## 2018-03-22 MED FILL — VENLAFAXINE HCL ER 37.5 MG: 37.5 | 35 days supply | Qty: 210 | Fill #0

## 2018-03-22 MED FILL — LEVOTHYROXINE 75 MCG TABLET: 75 | 35 days supply | Qty: 35 | Fill #0

## 2018-03-23 DIAGNOSIS — M069 Rheumatoid arthritis, unspecified: Secondary | ICD-10-CM | POA: Diagnosis not present

## 2018-03-23 DIAGNOSIS — G43709 Chronic migraine without aura, not intractable, without status migrainosus: Secondary | ICD-10-CM | POA: Diagnosis not present

## 2018-03-23 MED FILL — AMITRIPTYLINE HCL 50 MG TAB: 50 | 90 days supply | Qty: 90 | Fill #0

## 2018-03-23 MED FILL — TOPIRAMATE 100 MG TABLET: 100 | 90 days supply | Qty: 180 | Fill #0

## 2018-03-25 ENCOUNTER — Ambulatory Visit (INDEPENDENT_AMBULATORY_CARE_PROVIDER_SITE_OTHER): Payer: 59 | Admitting: Pharmacist

## 2018-03-25 DIAGNOSIS — Z79899 Other long term (current) drug therapy: Secondary | ICD-10-CM

## 2018-03-25 NOTE — Progress Notes (Signed)
   S: Patient presents to [facility] for review of their specialty medication therapy.  Patient is currently taking Humira for rheumatoid arthritis. Patient is managed by Dr. Estanislado Pandy for this. Last seen 11/11/17.  Adherence: denies any missed doses.  Efficacy: feels like the citrate free version works much better for her.  Drug-drug interactions: none  Screening: TB test: completed per patient Hepatitis: completed per patient  Monitoring: S/sx of infection: denies CBC: see below S/sx of hypersensitivity: denies S/sx of malignancy: denies S/sx of heart failure: denies  O:     Lab Results  Component Value Date   WBC 4.5 01/06/2018   HGB 12.8 01/06/2018   HCT 37.3 01/06/2018   MCV 96.9 01/06/2018   PLT 237 01/06/2018      Chemistry      Component Value Date/Time   NA 139 01/06/2018 0849   NA 139 04/20/2016   K 4.2 01/06/2018 0849   CL 109 01/06/2018 0849   CO2 24 01/06/2018 0849   BUN 17 01/06/2018 0849   BUN 20 04/20/2016   CREATININE 0.89 01/06/2018 0849   GLU 93 04/20/2016      Component Value Date/Time   CALCIUM 9.1 01/06/2018 0849   ALKPHOS 72 03/04/2017 1414   AST 21 01/06/2018 0849   ALT 29 01/06/2018 0849   BILITOT 0.3 01/06/2018 0849       A/P: 1. Medication review: Patient currently on Humira for rheumatoid arthritis. She is on the citrate free version and feel like that one works better for her. Denies any adverse effects. Reviewed the medication with the patient, including the following: Humira is a TNF blocking agent indicated for ankylosing spondylitis, Crohn's disease, Hidradenitis suppurativa, psoriatic arthritis, plaque psoriasis, ulcerative colitis, and uveitis. The most common adverse effects are infections, headache, and injection site reactions. There is the possibility of an increased risk of malignancy but it is not well understood if this increased risk is due to there medication or the disease state. There are  rare cases of pancytopenia and aplastic anemia. No recommendations for any changes at this time.   Christella Hartigan, PharmD, BCPS, BCACP, CPP Clinical Pharmacist Practitioner  [phone]

## 2018-03-29 MED FILL — HUMIRA PEN 40 MG/0.4ML PNKT: 40 | 28 days supply | Qty: 2 | Fill #2

## 2018-03-29 MED FILL — SHIPPING COST: 1 days supply | Qty: 1 | Fill #0

## 2018-03-30 NOTE — Progress Notes (Signed)
Office Visit Note  Patient: Courtney Leonard             Date of Birth: 06/21/1966           MRN: [id number]             PCP: Deborah Chalk, FNP Referring: Deborah Chalk, FNP Visit Date: 04/13/2018 Occupation: @GUAROCC @  Subjective:  Medication monitoring   History of Present Illness: Courtney Leonard is a 52 y.o. female with history of seropositive rheumatoid arthritis and osteoarthritis.  She is on Humira sq injections every 14 days, MTX 8 tablets po once a week, and folic acid 2 mg daily.  She denies missing any doses recently.  She denies any recent rheumatoid arthritis flares.  She denies any joint pain or joint swelling at this time.  She states she continues to morning stiffness in her hands that lasts 20 to 30 minutes.  She denies any pain in her feet at this time.  She has any other joint pain or joint swelling.  Denies any concerns at this time.    Activities of Daily Living:  Patient reports morning stiffness for 30  minutes.   Patient Denies nocturnal pain.  Difficulty dressing/grooming: Denies Difficulty climbing stairs: Denies Difficulty getting out of chair: Denies Difficulty using hands for taps, buttons, cutlery, and/or writing: Denies  Review of Systems  Constitutional: Negative for fatigue.  HENT: Positive for mouth dryness (Medication Side effect). Negative for mouth sores and nose dryness.   Eyes: Negative for pain, visual disturbance and dryness.  Respiratory: Negative for cough, hemoptysis, shortness of breath and difficulty breathing.   Cardiovascular: Negative for chest pain, palpitations, hypertension and swelling in legs/feet.  Gastrointestinal: Negative for blood in stool, constipation and diarrhea.  Endocrine: Negative for increased urination.  Genitourinary: Negative for painful urination.  Musculoskeletal: Positive for morning stiffness. Negative for arthralgias, joint pain, joint swelling, myalgias, muscle weakness, muscle tenderness and  myalgias.  Skin: Negative for color change, pallor, rash, hair loss, nodules/bumps, skin tightness, ulcers and sensitivity to sunlight.  Allergic/Immunologic: Negative for susceptible to infections.  Neurological: Positive for headaches (Hx of migraines). Negative for dizziness, numbness and weakness.  Hematological: Negative for swollen glands.  Psychiatric/Behavioral: Negative for depressed mood and sleep disturbance. The patient is nervous/anxious.     PMFS History:  Patient Active Problem List   Diagnosis Date Noted  . Rheumatoid arthritis involving multiple sites with positive rheumatoid factor ([place]) 06/13/2016  . High risk medication use 06/13/2016  . Primary osteoarthritis of both feet 06/13/2016  . Insomnia 06/13/2016  . Acquired hypothyroidism 06/13/2016  . Migraine without status migrainosus, not intractable 06/13/2016  . Former smoker 06/13/2016  . Anxiety 01/08/2011    Past Medical History:  Diagnosis Date  . Anal fissure   . Anxiety   . Arthritis   . Depression   . Hypothyroidism   . Insomnia   . Migraine   . Osteoarthritis   . Rheumatoid arthritis ([place])   . Thyroid disease     Family History  Problem Relation Age of Onset  . Diabetes Mother   . Hypertension Mother   . Atrial fibrillation Father   . Prostate cancer Father    Past Surgical History:  Procedure Laterality Date  . ABDOMINAL HYSTERECTOMY    . CESAREAN SECTION    . HEMORRHOID SURGERY    . KNEE ARTHROPLASTY    . KNEE SURGERY     RT knee  . laproscopy    .  ROBOTIC ASSISTED SALPINGO OOPHERECTOMY Left 09/12/2014   Procedure: ROBOTIC ASSISTED Left SALPINGO OOPHORECTOMY/Right Salpingectomy/Pelvic Washings;  Surgeon: Princess Bruins, MD;  Location: [facility];  Service: Gynecology;  Laterality: Left;   Social History   Social History Narrative  . Not on file    Objective: Vital Signs: BP 109/71 (BP Location: Left Arm, Patient Position: Sitting, Cuff Size: Normal)   Pulse 88   Resp 13   Ht 5'  3.5" (1.613 m)   Wt 167 lb 6.4 oz (75.9 kg)   BMI 29.19 kg/m    Physical Exam  Constitutional: She is oriented to person, place, and time. She appears well-developed and well-nourished.  HENT:  Head: Normocephalic and atraumatic.  Eyes: Conjunctivae and EOM are normal.  Neck: Normal range of motion.  Cardiovascular: Normal rate, regular rhythm, normal heart sounds and intact distal pulses.  Pulmonary/Chest: Effort normal and breath sounds normal.  Abdominal: Soft. Bowel sounds are normal.  Lymphadenopathy:    She has no cervical adenopathy.  Neurological: She is alert and oriented to person, place, and time.  Skin: Skin is warm and dry. Capillary refill takes less than 2 seconds.  Psychiatric: She has a normal mood and affect. Her behavior is normal.  Nursing note and vitals reviewed.    Musculoskeletal Exam: C-spine, thoracic spine, lumbar spine good range of motion.  No midline spinal tenderness.  No SI joint tenderness.  Shoulder joints, elbow joints, wrist joints, MCPs, PIPs, DIPs good range of motion with no synovitis.  She is complete fist formation bilaterally.  She has no tenderness on exam.  Hip joints, knee joints, ankle joints, MTPs, PIPs, DIPs good range of motion no synovitis.  No warmth or effusion of bilateral knee joints.  Has no Achilles tendinitis or plantar fasciitis.  No tenderness of trochanteric bursa bilaterally.  CDAI Exam: CDAI Score: 0.4  Patient Global Assessment: 2 (mm); Provider Global Assessment: 2 (mm) Swollen: 0 ; Tender: 0  Joint Exam   Not documented   There is currently no information documented on the homunculus. Go to the Rheumatology activity and complete the homunculus joint exam.  Investigation: No additional findings.  Imaging: No results found.  Recent Labs: Lab Results  Component Value Date   WBC 4.5 01/06/2018   HGB 12.8 01/06/2018   PLT 237 01/06/2018   NA 139 01/06/2018   K 4.2 01/06/2018   CL 109 01/06/2018   CO2 24  01/06/2018   GLUCOSE 52 (L) 01/06/2018   BUN 17 01/06/2018   CREATININE 0.89 01/06/2018   BILITOT 0.3 01/06/2018   ALKPHOS 72 03/04/2017   AST 21 01/06/2018   ALT 29 01/06/2018   PROT 6.9 01/06/2018   ALBUMIN 4.6 03/04/2017   CALCIUM 9.1 01/06/2018   GFRAA 88 01/06/2018    Speciality Comments: No specialty comments available.  Procedures:  No procedures performed Allergies: Sulfur   Assessment / Plan:     Visit Diagnoses: Rheumatoid arthritis involving multiple sites with positive rheumatoid factor ([place]): She has no active synovitis on exam.  She has not had any recent rheumatoid arthritis flares.  She has no joint pain or joint swelling at this time.  She continues to have morning stiffness in bilateral hands lasting 20 to 30 minutes.  She is clinically doing well on Humira subcutaneous injections every 14 days, methotrexate 8 tablets by mouth once weekly and folic acid 2 mg daily.  She will continue on his current treatment regimen.  She is advised to notify us if she develops  increased joint pain or joint swelling.  A refill of methotrexate was sent to the pharmacy today.  She will follow-up in the office in 5 months.  High risk medication use - Humira, MTX, folic acid.  -CBC and CMP will be drawn today to monitor for drug toxicity.  TB gold will also be drawn.  She will return in December and every 3 months for lab work to monitor for drug toxicity.  Future orders are in place.  Plan: COMPLETE METABOLIC PANEL WITH GFR, CBC with Differential/Platelet, QuantiFERON-TB Gold Plus  Primary osteoarthritis of both feet: She has no discomfort or tenderness at this time.  She wears proper fitting shoes.   Primary insomnia: She has been sleeping well at night.   Other medical conditions are listed as follows:   History of hypothyroidism  History of anxiety  History of migraine  Former smoker   Orders: Orders Placed This Encounter  Procedures  . COMPLETE METABOLIC PANEL WITH GFR  .  CBC with Differential/Platelet  . QuantiFERON-TB Gold Plus   Meds ordered this encounter  Medications  . methotrexate 2.5 MG tablet    Sig: TAKE 8 TABLETS BY MOUTH ONCE WEEKLY. CAUTION:CHEMOTHERAPY. PROTECT FROM LIGHT.    Dispense:  96 tablet    Refill:  0      Follow-Up Instructions: Return in about 5 months (around 09/13/2018) for Rheumatoid arthritis, Osteoarthritis.   Ofilia Neas, PA-C   I examined and evaluated the patient with Hazel Sams PA.  Patient had no synovitis on my examination.  She is doing quite well on combination of Humira and methotrexate.  The plan of care was discussed as noted above.  Bo Merino, MD  Note - This record has been created using Editor, commissioning.  Chart creation errors have been sought, but may not always  have been located. Such creation errors do not reflect on  the standard of medical care.

## 2018-03-31 DIAGNOSIS — G43709 Chronic migraine without aura, not intractable, without status migrainosus: Secondary | ICD-10-CM | POA: Diagnosis not present

## 2018-04-07 MED FILL — METHOTREXATE SODIUM 2.5 MG: 2.5 | 28 days supply | Qty: 32 | Fill #2

## 2018-04-13 ENCOUNTER — Encounter: Payer: Self-pay | Admitting: Rheumatology

## 2018-04-13 ENCOUNTER — Ambulatory Visit: Payer: 59 | Admitting: Rheumatology

## 2018-04-13 VITALS — BP 109/71 | HR 88 | Resp 13 | Ht 63.5 in | Wt 167.4 lb

## 2018-04-13 DIAGNOSIS — Z8669 Personal history of other diseases of the nervous system and sense organs: Secondary | ICD-10-CM | POA: Diagnosis not present

## 2018-04-13 DIAGNOSIS — Z87891 Personal history of nicotine dependence: Secondary | ICD-10-CM | POA: Diagnosis not present

## 2018-04-13 DIAGNOSIS — Z8659 Personal history of other mental and behavioral disorders: Secondary | ICD-10-CM

## 2018-04-13 DIAGNOSIS — Z8639 Personal history of other endocrine, nutritional and metabolic disease: Secondary | ICD-10-CM

## 2018-04-13 DIAGNOSIS — F5101 Primary insomnia: Secondary | ICD-10-CM

## 2018-04-13 DIAGNOSIS — M0579 Rheumatoid arthritis with rheumatoid factor of multiple sites without organ or systems involvement: Secondary | ICD-10-CM

## 2018-04-13 DIAGNOSIS — M19071 Primary osteoarthritis, right ankle and foot: Secondary | ICD-10-CM | POA: Diagnosis not present

## 2018-04-13 DIAGNOSIS — M19072 Primary osteoarthritis, left ankle and foot: Secondary | ICD-10-CM

## 2018-04-13 DIAGNOSIS — Z79899 Other long term (current) drug therapy: Secondary | ICD-10-CM

## 2018-04-13 MED ORDER — METHOTREXATE SODIUM 2.5 MG PO TABS: ORAL_TABLET | ORAL | 0 refills | Status: DC

## 2018-04-13 NOTE — Patient Instructions (Signed)
Standing Labs We placed an order today for your standing lab work.    Please come back and get your standing labs in December and every 3 months   We have open lab Monday through Friday from 8:30-11:30 AM and 1:30-4:00 PM  at the office of Dr. Shaili Deveshwar.   You may experience shorter wait times on Monday and Friday afternoons. The office is located at [address], [address], [place], [place] [address] No appointment is necessary.   Labs are drawn by [facility].  You may receive a bill from [facility] for your lab work. If you have any questions regarding directions or hours of operation,  please call [phone].     

## 2018-04-14 MED ORDER — FOLIC ACID 1 MG PO TABS: 2.0000 mg | ORAL_TABLET | Freq: Every day | ORAL | 3 refills | Status: DC

## 2018-04-14 MED FILL — FOLIC ACID 1 MG TABS: 1 | 90 days supply | Qty: 180 | Fill #0

## 2018-04-14 NOTE — Telephone Encounter (Signed)
Last Visit: 04/13/18 Next Visit: 09/13/18 Labs: 04/13/18 WNL  Okay to refill per Dr. Estanislado Pandy

## 2018-04-14 NOTE — Progress Notes (Signed)
Labs are WNL.

## 2018-04-16 LAB — QUANTIFERON-TB GOLD PLUS
NIL: 0.06 IU/mL
QuantiFERON-TB Gold Plus: NEGATIVE
TB1-NIL: <0 IU/mL
TB2-NIL: 0 [IU]/mL

## 2018-04-16 LAB — COMPLETE METABOLIC PANEL WITH GFR
AG Ratio: 1.8 (calc) (ref 1.0–2.5)
ALT: 21 U/L (ref 6–29)
AST: 19 U/L (ref 10–35)
Albumin: 4.3 g/dL (ref 3.6–5.1)
Alkaline phosphatase (APISO): 76 U/L (ref 33–130)
BUN: 15 mg/dL (ref 7–25)
CALCIUM: 9.2 mg/dL (ref 8.6–10.4)
CO2: 26 mmol/L (ref 20–32)
Chloride: 108 mmol/L (ref 98–110)
Creat: 1.02 mg/dL (ref 0.50–1.05)
GFR, EST NON AFRICAN AMERICAN: 64 mL/min/{1.73_m2} (ref >=60)
GFR, Est African American: 74 mL/min/{1.73_m2} (ref >=60)
Globulin: 2.4 g/dL (calc) (ref 1.9–3.7)
Glucose, Bld: 95 mg/dL (ref 65–99)
POTASSIUM: 4.2 mmol/L (ref 3.5–5.3)
Sodium: 141 mmol/L (ref 135–146)
Total Bilirubin: 0.4 mg/dL (ref 0.2–1.2)
Total Protein: 6.7 g/dL (ref 6.1–8.1)

## 2018-04-16 LAB — CBC WITH DIFFERENTIAL/PLATELET
Basophils Absolute: 41 cells/uL (ref 0–200)
Basophils Relative: 1 %
EOS ABS: 49 {cells}/uL (ref 15–500)
EOS PCT: 1.2 %
HEMATOCRIT: 36.6 % (ref 35.0–45.0)
Hemoglobin: 12.7 g/dL (ref 11.7–15.5)
Lymphs Abs: 1328 cells/uL (ref 850–3900)
MCH: 33.4 pg — ABNORMAL HIGH (ref 27.0–33.0)
MCHC: 34.7 g/dL (ref 32.0–36.0)
MCV: 96.3 fL (ref 80.0–100.0)
MPV: 10 fL (ref 7.5–12.5)
Monocytes Relative: 6.1 %
NEUTROS ABS: 2431 {cells}/uL (ref 1500–7800)
Neutrophils Relative %: 59.3 %
Platelets: 212 10*3/uL (ref 140–400)
RBC: 3.8 10*6/uL (ref 3.80–5.10)
RDW: 13.2 % (ref 11.0–15.0)
Total Lymphocyte: 32.4 %
WBC mixed population: 250 cells/uL (ref 200–950)
WBC: 4.1 10*3/uL (ref 3.8–10.8)

## 2018-04-18 NOTE — Progress Notes (Signed)
TB gold negative

## 2018-04-20 ENCOUNTER — Other Ambulatory Visit: Payer: Self-pay | Admitting: Rheumatology

## 2018-04-20 ENCOUNTER — Other Ambulatory Visit: Payer: Self-pay | Admitting: Pharmacist

## 2018-04-20 MED ORDER — ADALIMUMAB 40 MG/0.4ML ~~LOC~~ AJKT: 40.0000 mg | AUTO-INJECTOR | SUBCUTANEOUS | 2 refills | Status: DC

## 2018-04-20 NOTE — Telephone Encounter (Signed)
Last visit: 04/13/18 Next visit: 09/13/17 Labs: 04/13/18 WNL Tb Gold: 04/13/18 Neg   Okay to refill per Dr.Deveshwar

## 2018-04-26 DIAGNOSIS — Z1231 Encounter for screening mammogram for malignant neoplasm of breast: Secondary | ICD-10-CM | POA: Diagnosis not present

## 2018-04-26 DIAGNOSIS — N951 Menopausal and female climacteric states: Secondary | ICD-10-CM | POA: Diagnosis not present

## 2018-04-26 DIAGNOSIS — Z0000 Encounter for general adult medical examination without abnormal findings: Secondary | ICD-10-CM | POA: Diagnosis not present

## 2018-04-26 DIAGNOSIS — M25521 Pain in right elbow: Secondary | ICD-10-CM | POA: Diagnosis not present

## 2018-04-26 DIAGNOSIS — E039 Hypothyroidism, unspecified: Secondary | ICD-10-CM | POA: Diagnosis not present

## 2018-04-26 DIAGNOSIS — Z90710 Acquired absence of both cervix and uterus: Secondary | ICD-10-CM | POA: Diagnosis not present

## 2018-04-26 DIAGNOSIS — M069 Rheumatoid arthritis, unspecified: Secondary | ICD-10-CM | POA: Diagnosis not present

## 2018-04-26 DIAGNOSIS — F419 Anxiety disorder, unspecified: Secondary | ICD-10-CM | POA: Diagnosis not present

## 2018-04-26 DIAGNOSIS — F5109 Other insomnia not due to a substance or known physiological condition: Secondary | ICD-10-CM | POA: Diagnosis not present

## 2018-04-26 MED FILL — HUMIRA PEN 40 MG/0.4ML PNKT: 40 | 28 days supply | Qty: 2 | Fill #0

## 2018-04-26 MED FILL — traZODone HCL 50 MG TABS: 50 | 90 days supply | Qty: 90 | Fill #0

## 2018-04-26 MED FILL — SHIPPING COST: 1 days supply | Qty: 1 | Fill #1

## 2018-04-26 MED FILL — DICLOFENAC SODIUM 1% GEL: 1 | 13 days supply | Qty: 100 | Fill #0

## 2018-04-26 MED FILL — VENLAFAXINE HCL ER 75 MG CA: 75 | 90 days supply | Qty: 270 | Fill #0

## 2018-04-26 MED FILL — ALPRAZolam 0.5 MG TABS: 0.5 | 30 days supply | Qty: 30 | Fill #0

## 2018-05-19 DIAGNOSIS — Z1239 Encounter for other screening for malignant neoplasm of breast: Secondary | ICD-10-CM | POA: Diagnosis not present

## 2018-05-19 DIAGNOSIS — Z1231 Encounter for screening mammogram for malignant neoplasm of breast: Secondary | ICD-10-CM | POA: Diagnosis not present

## 2018-05-23 MED FILL — SHIPPING COST: 1 days supply | Qty: 1 | Fill #2

## 2018-05-23 MED FILL — HUMIRA PEN 40 MG/0.4ML PNKT: 40 | 28 days supply | Qty: 2 | Fill #1

## 2018-06-15 DIAGNOSIS — Z0000 Encounter for general adult medical examination without abnormal findings: Secondary | ICD-10-CM | POA: Diagnosis not present

## 2018-06-15 DIAGNOSIS — E039 Hypothyroidism, unspecified: Secondary | ICD-10-CM | POA: Diagnosis not present

## 2018-06-16 ENCOUNTER — Telehealth: Payer: Self-pay | Admitting: Pharmacy Technician

## 2018-06-16 MED FILL — LEVOTHYROXINE 88 MCG TABLET: 88 | 30 days supply | Qty: 30 | Fill #0

## 2018-06-16 NOTE — Telephone Encounter (Signed)
Received a Prior Authorization request from [facility] for Humira CF. Authorization has been submitted to patient's insurance via Cover My Meds. Will update once we receive a response.  Current auth expires next month.  9:40 AM Beatriz Chancellor, CPhT

## 2018-06-17 NOTE — Telephone Encounter (Signed)
Received a fax from [facility] regarding a prior authorization for Humira. Authorization has been APPROVED from 06/16/2018 to 06/16/2019.   Will send document to scan center.  Authorization # [id number] Phone # [phone]  8:25 AM Beatriz Chancellor, CPhT

## 2018-06-20 MED FILL — SHIPPING COST: 1 days supply | Qty: 1 | Fill #3

## 2018-06-20 MED FILL — HUMIRA PEN 40 MG/0.4ML PNKT: 40 | 28 days supply | Qty: 2 | Fill #2

## 2018-07-11 ENCOUNTER — Telehealth: Payer: Self-pay | Admitting: Pharmacist

## 2018-07-11 NOTE — Telephone Encounter (Signed)
Courtesy call for [place] Humira Follow.  She is also due for labs.  Left voicemail to return call.

## 2018-07-12 ENCOUNTER — Other Ambulatory Visit: Payer: Self-pay | Admitting: Rheumatology

## 2018-07-12 ENCOUNTER — Other Ambulatory Visit: Payer: Self-pay | Admitting: Internal Medicine

## 2018-07-12 ENCOUNTER — Other Ambulatory Visit: Payer: Self-pay | Admitting: Pharmacist

## 2018-07-12 MED ORDER — ADALIMUMAB 40 MG/0.4ML ~~LOC~~ AJKT: 40.0000 mg | AUTO-INJECTOR | SUBCUTANEOUS | 0 refills | Status: DC

## 2018-07-12 NOTE — Telephone Encounter (Signed)
Thank you for informing me.

## 2018-07-12 NOTE — Telephone Encounter (Signed)
Courtney Leonard Follow-UP  Called patient today to follow up regarding patient's rheumatology medication: Humira  Patient is tolerating Humira well.  She denies missing any doses and any new medical conditions/medications/allergies.  RA symptoms are improved with Humira but did report an increase in morning stiffness with the cold weather.    Also reported that during one injection that was painful and caused an immediate bruise.  Informed patient that can happen when injecting into a vein or blood vessel. Instructed patient to avoid veins/scars and she can use a cold compress for 10-15 mins after injection to help with pain.    Reminded patient she is due for labs prior to her next refill.  Stated she has it in her calendar and will come in the office in the next week or so.    Patient knows to call the office with questions or concerns. Rheumatology Clinic will continue to follow.  Mariella Saa, PharmD, [facility] Rheumatology Clinical Pharmacist  07/12/2018 10:06 AM

## 2018-07-12 NOTE — Telephone Encounter (Signed)
Last visit: 04/13/18 Next visit: 09/13/17 Labs: 04/13/18 WNL Tb Gold: 04/13/18 Neg   Patient reminded she is due for labs this month. Patient to update this week.   Okay to refill per Dr.Deveshwar

## 2018-07-13 DIAGNOSIS — G43709 Chronic migraine without aura, not intractable, without status migrainosus: Secondary | ICD-10-CM | POA: Diagnosis not present

## 2018-07-13 MED FILL — TOPIRAMATE 100 MG TABLET: 100 | 90 days supply | Qty: 180 | Fill #0

## 2018-07-13 MED FILL — AMITRIPTYLINE HCL 50 MG TAB: 50 | 90 days supply | Qty: 90 | Fill #0

## 2018-07-13 MED FILL — SUMATRIPTAN SUCC 50 MG TAB: 50 | 30 days supply | Qty: 12 | Fill #0

## 2018-07-18 MED FILL — LEVOTHYROXINE 88 MCG TABLET: 88 | 30 days supply | Qty: 30 | Fill #1

## 2018-07-18 MED FILL — VENLAFAXINE HCL ER 75 MG CA: 75 | 90 days supply | Qty: 270 | Fill #1

## 2018-07-18 MED FILL — FOLIC ACID 1 MG TABS: 1 | 90 days supply | Qty: 180 | Fill #1

## 2018-07-19 MED FILL — SHIPPING COST: 1 days supply | Qty: 1 | Fill #4

## 2018-07-19 MED FILL — HUMIRA PEN 40 MG/0.4ML PNKT: 40 | 28 days supply | Qty: 2 | Fill #0

## 2018-07-22 ENCOUNTER — Other Ambulatory Visit: Payer: Self-pay

## 2018-07-22 ENCOUNTER — Emergency Department (INDEPENDENT_AMBULATORY_CARE_PROVIDER_SITE_OTHER)
Admission: EM | Admit: 2018-07-22 | Discharge: 2018-07-22 | Disposition: A | Discharge status: Home / Self Care | Payer: 59 | Source: Home / Self Care

## 2018-07-22 ENCOUNTER — Encounter: Payer: Self-pay | Admitting: Emergency Medicine

## 2018-07-22 DIAGNOSIS — J111 Influenza due to unidentified influenza virus with other respiratory manifestations: Secondary | ICD-10-CM | POA: Diagnosis not present

## 2018-07-22 DIAGNOSIS — R6889 Other general symptoms and signs: Secondary | ICD-10-CM

## 2018-07-22 DIAGNOSIS — J101 Influenza due to other identified influenza virus with other respiratory manifestations: Secondary | ICD-10-CM

## 2018-07-22 LAB — POCT INFLUENZA A/B
Influenza A, POC: NEGATIVE
Influenza B, POC: POSITIVE — AB

## 2018-07-22 MED ORDER — OSELTAMIVIR PHOSPHATE 75 MG PO CAPS: 75.0000 mg | ORAL_CAPSULE | Freq: Two times a day (BID) | ORAL | 0 refills | Status: DC

## 2018-07-22 MED ORDER — ONDANSETRON 4 MG PO TBDP: 4.0000 mg | ORAL_TABLET | Freq: Three times a day (TID) | ORAL | 0 refills | Status: DC | PRN

## 2018-07-22 MED ORDER — BALOXAVIR MARBOXIL(40 MG DOSE) 2 X 20 MG PO TBPK: 2.0000 | ORAL_TABLET | Freq: Once | ORAL | 0 refills | Status: DC

## 2018-07-22 MED FILL — ONDANSETRON ODT 4 MG TABLET: 4 | 3 days supply | Qty: 8 | Fill #0

## 2018-07-22 MED FILL — OSELTAMIVIR PHOSPHATE 75 MG: 75 | 5 days supply | Qty: 10 | Fill #0

## 2018-07-22 NOTE — ED Triage Notes (Signed)
Body aches, loss of appetite, nausea without vomiting x 3 days

## 2018-07-22 NOTE — Discharge Instructions (Signed)
  You may take 500mg acetaminophen every 4-6 hours or in combination with ibuprofen 400-600mg every 6-8 hours as needed for pain, inflammation, and fever.  Be sure to well hydrated with clear liquids and get at least 8 hours of sleep at night, preferably more while sick.   Please follow up with family medicine in 1 week if needed.   

## 2018-07-22 NOTE — ED Provider Notes (Signed)
[facility]    CSN: [id number] Arrival date & time: 07/22/18  1143     History   Chief Complaint Chief Complaint  Patient presents with  . Generalized Body Aches    HPI Courtney Leonard is a 52 y.o. female.   HPI  Courtney Leonard is a 52 y.o. female presenting to UC with c/o sudden onset fatigue, body aches, nausea. Minimal congestion. Pt did get the flu vaccine this season. Pt works in [employer]. Denies known fever. Denies vomiting or diarrhea.    Past Medical History:  Diagnosis Date  . Anal fissure   . Anxiety   . Arthritis   . Depression   . Hypothyroidism   . Insomnia   . Migraine   . Osteoarthritis   . Rheumatoid arthritis ([place])   . Thyroid disease     Patient Active Problem List   Diagnosis Date Noted  . Rheumatoid arthritis involving multiple sites with positive rheumatoid factor ([place]) 06/13/2016  . High risk medication use 06/13/2016  . Primary osteoarthritis of both feet 06/13/2016  . Insomnia 06/13/2016  . Acquired hypothyroidism 06/13/2016  . Migraine without status migrainosus, not intractable 06/13/2016  . Former smoker 06/13/2016  . Anxiety 01/08/2011    Past Surgical History:  Procedure Laterality Date  . ABDOMINAL HYSTERECTOMY    . CESAREAN SECTION    . HEMORRHOID SURGERY    . KNEE ARTHROPLASTY    . KNEE SURGERY     RT knee  . laproscopy    . ROBOTIC ASSISTED SALPINGO OOPHERECTOMY Left 09/12/2014   Procedure: ROBOTIC ASSISTED Left SALPINGO OOPHORECTOMY/Right Salpingectomy/Pelvic Washings;  Surgeon: Princess Bruins, MD;  Location: [facility];  Service: Gynecology;  Laterality: Left;    OB History   No obstetric history on file.      Home Medications    Prior to Admission medications   Medication Sig Start Date End Date Taking? Authorizing Provider  Adalimumab (HUMIRA PEN) 40 MG/0.4ML PNKT Inject 40 mg into the skin every 14 (fourteen) days. 07/12/18   Tresa Garter, MD  ALPRAZolam Duanne Moron) 0.5 MG tablet Take  0.5 mg by mouth as needed.  04/10/16   [provider]  amitriptyline (ELAVIL) 50 MG tablet Take 50 mg by mouth. 01/16/16   [provider]  Baloxavir Marboxil,40 MG Dose, (XOFLUZA) 2 x 20 MG TBPK Take 2 tablets by mouth once for 1 dose. 07/22/18 07/22/18  Noe Gens, PA-C  folic acid (FOLVITE) 1 MG tablet Take 2 tablets (2 mg total) by mouth daily. 04/14/18   Bo Merino, MD  levothyroxine (SYNTHROID, LEVOTHROID) 75 MCG tablet Take 75 mcg by mouth daily before breakfast.    [provider]  methotrexate 2.5 MG tablet TAKE 8 TABLETS BY MOUTH ONCE WEEKLY. CAUTION:CHEMOTHERAPY. PROTECT FROM LIGHT. 04/13/18   Ofilia Neas, PA-C  OnabotulinumtoxinA (BOTOX IJ) Inject as directed every 3 (three) months.    [provider]  ondansetron (ZOFRAN ODT) 4 MG disintegrating tablet Take 1 tablet (4 mg total) by mouth every 8 (eight) hours as needed. 07/22/18   Noe Gens, PA-C  SUMAtriptan (IMITREX) 50 MG tablet Take 50 mg by mouth every 2 (two) hours as needed for migraine or headache. May repeat in 2 hours if headache persists or recurs.    [provider]  topiramate (TOPAMAX) 100 MG tablet Take 100 mg by mouth 2 (two) times daily.    [provider]  traZODone (DESYREL) 50 MG tablet Take 50 mg by mouth.  11/21/15   [provider]  Tuberculin-Allergy Syringes 27G X 1/2" 1 ML MISC Patient to use to inject Methotrexate once weekly. 03/02/17   Bo Merino, MD  venlafaxine XR (EFFEXOR-XR) 150 MG 24 hr capsule Take 150 mg by mouth. 04/10/16   [provider]    Family History Family History  Problem Relation Age of Onset  . Diabetes Mother   . Hypertension Mother   . Atrial fibrillation Father   . Prostate cancer Father     Social History Social History   Tobacco Use  . Smoking status: Former Smoker    Packs/day: 0.10    Years: 5.00    Pack years: 0.50    Types: Cigarettes    Last attempt to quit: 06/28/2001     Years since quitting: 17.0  . Smokeless tobacco: Never Used  Substance Use Topics  . Alcohol use: No  . Drug use: No     Allergies   Sulfur   Review of Systems Review of Systems  Constitutional: Positive for fatigue. Negative for chills and fever.  HENT: Positive for congestion. Negative for ear pain, sore throat, trouble swallowing and voice change.   Respiratory: Positive for cough. Negative for shortness of breath.   Cardiovascular: Negative for chest pain and palpitations.  Gastrointestinal: Positive for nausea. Negative for abdominal pain, diarrhea and vomiting.  Musculoskeletal: Positive for arthralgias, back pain and myalgias.  Skin: Negative for rash.  Neurological: Negative for dizziness, light-headedness and headaches.     Physical Exam Triage Vital Signs ED Triage Vitals  Enc Vitals Group     BP 07/22/18 1219 115/82     Pulse Rate 07/22/18 1219 77     Resp --      Temp 07/22/18 1219 98 F (36.7 C)     Temp Source 07/22/18 1219 Oral     SpO2 07/22/18 1219 100 %     Weight 07/22/18 1223 165 lb (74.8 kg)     Height 07/22/18 1223 5\' 3"  (1.6 m)     Head Circumference --      Peak Flow --      Pain Score 07/22/18 1222 2     Pain Loc --      Pain Edu? --      Excl. in [place]? --    No data found.  Updated Vital Signs BP 115/82 (BP Location: Right Arm)   Pulse 77   Temp 98 F (36.7 C) (Oral)   Ht 5\' 3"  (1.6 m)   Wt 165 lb (74.8 kg)   SpO2 100%   BMI 29.23 kg/m   Visual Acuity Right Eye Distance:   Left Eye Distance:   Bilateral Distance:    Right Eye Near:   Left Eye Near:    Bilateral Near:     Physical Exam Vitals signs and nursing note reviewed.  Constitutional:      Appearance: Normal appearance. She is well-developed.  HENT:     Head: Normocephalic and atraumatic.     Right Ear: Tympanic membrane normal.     Left Ear: Tympanic membrane normal.     Nose: Nose normal.     Mouth/Throat:     Lips: Pink.     Mouth: Mucous membranes are  moist.     Pharynx: Oropharynx is clear. Uvula midline. No pharyngeal swelling, oropharyngeal exudate, posterior oropharyngeal erythema or uvula swelling.  Neck:     Musculoskeletal: Normal range of motion.  Cardiovascular:     Rate and Rhythm: Normal  rate and regular rhythm.  Pulmonary:     Effort: Pulmonary effort is normal. No respiratory distress.     Breath sounds: Normal breath sounds. No stridor. No wheezing, rhonchi or rales.  Musculoskeletal: Normal range of motion.  Skin:    General: Skin is warm and dry.  Neurological:     Mental Status: She is alert and oriented to person, place, and time.  Psychiatric:        Behavior: Behavior normal.      UC Treatments / Results  Labs (all labs ordered are listed, but only abnormal results are displayed) Labs Reviewed  POCT INFLUENZA A/B - Abnormal; Notable for the following components:      Result Value   Influenza B, POC Positive (*)    All other components within normal limits    EKG None  Radiology No results found.  Procedures Procedures (including critical care time)  Medications Ordered in UC Medications - No data to display  Initial Impression / Assessment and Plan / UC Course  I have reviewed the triage vital signs and the nursing notes.  Pertinent labs & imaging results that were available during my care of the patient were reviewed by me and considered in my medical decision making (see chart for details).     Rapid flu: POSITIVE Influenza B No evidence of secondary bacterial infection on exam. Will start pt on [facility] care info provided  Final Clinical Impressions(s) / UC Diagnoses   Final diagnoses:  Flu-like symptoms  Influenza B     Discharge Instructions      You may take 500mg  acetaminophen every 4-6 hours or in combination with ibuprofen 400-600mg  every 6-8 hours as needed for pain, inflammation, and fever.  Be sure to well hydrated with clear liquids and get at least 8 hours of  sleep at night, preferably more while sick.   Please follow up with family medicine in 1 week if needed.     ED Prescriptions    Medication Sig Dispense Auth. Provider   Baloxavir Marboxil,40 MG Dose, (XOFLUZA) 2 x 20 MG TBPK Take 2 tablets by mouth once for 1 dose. 2 each Noe Gens, PA-C   ondansetron (ZOFRAN ODT) 4 MG disintegrating tablet Take 1 tablet (4 mg total) by mouth every 8 (eight) hours as needed. 8 tablet Noe Gens, PA-C     Controlled Substance Prescriptions [place] Controlled Substance Registry consulted? Not Applicable   Tyrell Antonio 07/22/18 1413

## 2018-08-02 ENCOUNTER — Other Ambulatory Visit: Payer: Self-pay

## 2018-08-02 ENCOUNTER — Other Ambulatory Visit: Payer: Self-pay | Admitting: *Deleted

## 2018-08-02 DIAGNOSIS — Z9225 Personal history of immunosupression therapy: Secondary | ICD-10-CM | POA: Diagnosis not present

## 2018-08-02 DIAGNOSIS — Z79899 Other long term (current) drug therapy: Secondary | ICD-10-CM

## 2018-08-02 MED ORDER — METHOTREXATE SODIUM 2.5 MG PO TABS: ORAL_TABLET | ORAL | 0 refills | Status: DC

## 2018-08-02 NOTE — Telephone Encounter (Signed)
Last visit: 04/13/18 Next visit: 09/13/17 Labs: 04/13/18 WNL updated labs 08/02/18  Okay to refill per Dr. Estanislado Pandy

## 2018-08-05 LAB — CBC WITH DIFFERENTIAL/PLATELET
Absolute Monocytes: 506 cells/uL (ref 200–950)
BASOS PCT: 0.9 %
Basophils Absolute: 50 cells/uL (ref 0–200)
Eosinophils Absolute: 50 cells/uL (ref 15–500)
Eosinophils Relative: 0.9 %
HCT: 38.6 % (ref 35.0–45.0)
Hemoglobin: 13.7 g/dL (ref 11.7–15.5)
Lymphs Abs: 2266 cells/uL (ref 850–3900)
MCH: 34.4 pg — ABNORMAL HIGH (ref 27.0–33.0)
MCHC: 35.5 g/dL (ref 32.0–36.0)
MCV: 97 fL (ref 80.0–100.0)
MPV: 10.2 fL (ref 7.5–12.5)
Monocytes Relative: 9.2 %
Neutro Abs: 2629 cells/uL (ref 1500–7800)
Neutrophils Relative %: 47.8 %
PLATELETS: 234 10*3/uL (ref 140–400)
RBC: 3.98 10*6/uL (ref 3.80–5.10)
RDW: 13 % (ref 11.0–15.0)
Total Lymphocyte: 41.2 %
WBC: 5.5 10*3/uL (ref 3.8–10.8)

## 2018-08-05 LAB — COMPLETE METABOLIC PANEL WITH GFR
AG Ratio: 1.8 (calc) (ref 1.0–2.5)
ALT: 27 U/L (ref 6–29)
AST: 18 U/L (ref 10–35)
Albumin: 4.8 g/dL (ref 3.6–5.1)
Alkaline phosphatase (APISO): 86 U/L (ref 33–130)
BUN: 16 mg/dL (ref 7–25)
CO2: 26 mmol/L (ref 20–32)
CREATININE: 0.88 mg/dL (ref 0.50–1.05)
Calcium: 9.5 mg/dL (ref 8.6–10.4)
Chloride: 106 mmol/L (ref 98–110)
GFR, Est African American: 88 mL/min/{1.73_m2} (ref >=60)
GFR, Est Non African American: 76 mL/min/{1.73_m2} (ref >=60)
GLOBULIN: 2.6 g/dL (ref 1.9–3.7)
Glucose, Bld: 87 mg/dL (ref 65–99)
Potassium: 4.1 mmol/L (ref 3.5–5.3)
Sodium: 140 mmol/L (ref 135–146)
Total Bilirubin: 0.3 mg/dL (ref 0.2–1.2)
Total Protein: 7.4 g/dL (ref 6.1–8.1)

## 2018-08-05 LAB — QUANTIFERON-TB GOLD PLUS
MITOGEN-NIL: 7.27 [IU]/mL
NIL: 0.02 IU/mL
QuantiFERON-TB Gold Plus: NEGATIVE
TB1-NIL: 0.03 IU/mL
TB2-NIL: 0.03 IU/mL

## 2018-08-12 ENCOUNTER — Other Ambulatory Visit: Payer: Self-pay | Admitting: Pharmacist

## 2018-08-12 ENCOUNTER — Other Ambulatory Visit: Payer: Self-pay | Admitting: Rheumatology

## 2018-08-12 MED ORDER — ADALIMUMAB 40 MG/0.4ML ~~LOC~~ AJKT: 40.0000 mg | AUTO-INJECTOR | SUBCUTANEOUS | 0 refills | Status: DC

## 2018-08-12 NOTE — Telephone Encounter (Signed)
Last visit: 04/13/18 Next visit: 09/13/17 Labs: 08/02/18 CMP WNL. MCH stable. Rest of CBC WNL. Tb Gold: 08/02/18 Neg   Okay to refill per Dr. Estanislado Pandy

## 2018-08-15 MED FILL — SHIPPING COST: 1 days supply | Qty: 1 | Fill #5

## 2018-08-15 MED FILL — HUMIRA PEN 40 MG/0.4ML PNKT: 40 | 28 days supply | Qty: 2 | Fill #0

## 2018-08-26 MED FILL — LEVOTHYROXINE 88 MCG TABLET: 88 | 30 days supply | Qty: 30 | Fill #0

## 2018-08-30 NOTE — Progress Notes (Signed)
Office Visit Note  Patient: Courtney Leonard             Date of Birth: 1966-03-14           MRN: [id number]             PCP: Deborah Chalk, FNP Referring: Deborah Chalk, FNP Visit Date: 09/13/2018 Occupation: @GUAROCC @  Subjective:  Increased joint pain and swelling.   History of Present Illness: Courtney Leonard is a 53 y.o. female with history of seropositive rheumatoid arthritis and osteoarthritis.  She states she is having increased pain and discomfort in her joints.  She reports pain and swelling in her hands and feet.  She also has discomfort in her hips and knee joints.  The stiffness for approximately 3 hours in the morning.  Activities of Daily Living:  Patient reports morning stiffness for 3 hours.   Patient Reports nocturnal pain.  Difficulty dressing/grooming: Reports Difficulty climbing stairs: Reports Difficulty getting out of chair: Denies Difficulty using hands for taps, buttons, cutlery, and/or writing: Reports  Review of Systems  Constitutional: Negative for fatigue, night sweats, weight gain and weight loss.  HENT: Negative for mouth sores, trouble swallowing, trouble swallowing, mouth dryness and nose dryness.   Eyes: Negative for pain, redness, visual disturbance and dryness.  Respiratory: Negative for cough, shortness of breath and difficulty breathing.   Cardiovascular: Negative for chest pain, palpitations, hypertension, irregular heartbeat and swelling in legs/feet.  Gastrointestinal: Negative for blood in stool, constipation and diarrhea.  Endocrine: Negative for increased urination.  Genitourinary: Negative for difficulty urinating and vaginal dryness.  Musculoskeletal: Positive for arthralgias, joint pain, joint swelling, morning stiffness and muscle tenderness. Negative for myalgias, muscle weakness and myalgias.  Skin: Negative for color change, rash, hair loss, skin tightness, ulcers and sensitivity to sunlight.  Allergic/Immunologic:  Negative for susceptible to infections.  Neurological: Negative for dizziness, numbness, memory loss, night sweats and weakness.  Hematological: Negative for bruising/bleeding tendency and swollen glands.  Psychiatric/Behavioral: Negative for depressed mood and sleep disturbance. The patient is not nervous/anxious.     PMFS History:  Patient Active Problem List   Diagnosis Date Noted  . Rheumatoid arthritis involving multiple sites with positive rheumatoid factor ([place]) 06/13/2016  . High risk medication use 06/13/2016  . Primary osteoarthritis of both feet 06/13/2016  . Insomnia 06/13/2016  . Acquired hypothyroidism 06/13/2016  . Migraine without status migrainosus, not intractable 06/13/2016  . Former smoker 06/13/2016  . Anxiety 01/08/2011    Past Medical History:  Diagnosis Date  . Anal fissure   . Anxiety   . Arthritis   . Depression   . Hypothyroidism   . Insomnia   . Migraine   . Osteoarthritis   . Rheumatoid arthritis ([place])   . Thyroid disease     Family History  Problem Relation Age of Onset  . Diabetes Mother   . Hypertension Mother   . Atrial fibrillation Father   . Prostate cancer Father    Past Surgical History:  Procedure Laterality Date  . ABDOMINAL HYSTERECTOMY    . CESAREAN SECTION    . HEMORRHOID SURGERY    . KNEE ARTHROPLASTY    . KNEE SURGERY     RT knee  . laproscopy    . ROBOTIC ASSISTED SALPINGO OOPHERECTOMY Left 09/12/2014   Procedure: ROBOTIC ASSISTED Left SALPINGO OOPHORECTOMY/Right Salpingectomy/Pelvic Washings;  Surgeon: Princess Bruins, MD;  Location: [facility];  Service: Gynecology;  Laterality: Left;   Social History   Social  History Narrative  . Not on file   Immunization History  Administered Date(s) Administered  . Tdap 01/13/2016     Objective: Vital Signs: BP 103/71 (BP Location: Left Arm, Patient Position: Sitting, Cuff Size: Normal)   Pulse 87   Resp 14   Ht 5' 3.5" (1.613 m)   Wt 167 lb 9.6 oz (76 kg)   BMI 29.22  kg/m    Physical Exam Vitals signs and nursing note reviewed.  Constitutional:      Appearance: She is well-developed.  HENT:     Head: Normocephalic and atraumatic.  Eyes:     Conjunctiva/sclera: Conjunctivae normal.  Neck:     Musculoskeletal: Normal range of motion.  Cardiovascular:     Rate and Rhythm: Normal rate and regular rhythm.     Heart sounds: Normal heart sounds.  Pulmonary:     Effort: Pulmonary effort is normal.     Breath sounds: Normal breath sounds.  Abdominal:     General: Bowel sounds are normal.     Palpations: Abdomen is soft.  Lymphadenopathy:     Cervical: No cervical adenopathy.  Skin:    General: Skin is warm and dry.     Capillary Refill: Capillary refill takes less than 2 seconds.  Neurological:     Mental Status: She is alert and oriented to person, place, and time.  Psychiatric:        Behavior: Behavior normal.      Musculoskeletal Exam: Spine thoracic lumbar spine good range of motion.  Shoulder joints elbow joints wrist joint MCPs PIPs been good range of motion.  No synovitis was noted.  PIP and DIP thickening of hands and feet was noted.  Hip joints knee joints ankles MTPs PIPs were in good range of motion with no synovitis.  CDAI Exam: CDAI Score: 8.7  Patient Global Assessment: 5 (mm); Provider Global Assessment: 2 (mm) Swollen: 0 ; Tender: 18  Joint Exam      Right  Left  MCP 2   Tender   Tender  MCP 3   Tender   Tender  MCP 4   Tender   Tender  MCP 5   Tender   Tender  MTP 1   Tender   Tender  MTP 2   Tender   Tender  MTP 3   Tender   Tender  MTP 4   Tender   Tender  MTP 5   Tender   Tender     Investigation: No additional findings.  Imaging: Xr Foot 2 Views Left  Result Date: 09/13/2018 PIP and DIP narrowing was noted.  No MTP or intertarsal joint space narrowing was noted.  No erosive changes were noted.  Small calcaneal spur was noted.  Juxta-articular osteopenia was noted. Impression: These findings are consistent  with rheumatoid arthritis and osteoarthritis of the foot.  Xr Foot 2 Views Right  Result Date: 09/13/2018 PIP and DIP narrowing was noted.  No MTP or intertarsal joint space narrowing was noted.  No erosive changes were noted.  Small calcaneal spur was noted.  Juxta-articular osteopenia was noted. Impression: These findings are consistent with rheumatoid arthritis and osteoarthritis of the foot.  Xr Hand 2 View Left  Result Date: 09/13/2018 Juxta-articular osteopenia was noted.  PIP and DIP narrowing was noted.  No intercarpal or radiocarpal joint space narrowing was noted.  No erosive changes were noted. Impression: These findings are consistent with rheumatoid arthritis and osteoarthritis overlap.  Xr Hand 2 View Right  Result  Date: 09/13/2018 Juxta-articular osteopenia was noted.  PIP and DIP narrowing was noted.  No intercarpal or radiocarpal joint space narrowing was noted.  No erosive changes were noted. Impression: These findings are consistent with rheumatoid arthritis and osteoarthritis overlap.   Recent Labs: Lab Results  Component Value Date   WBC 5.5 08/02/2018   HGB 13.7 08/02/2018   PLT 234 08/02/2018   NA 140 08/02/2018   K 4.1 08/02/2018   CL 106 08/02/2018   CO2 26 08/02/2018   GLUCOSE 87 08/02/2018   BUN 16 08/02/2018   CREATININE 0.88 08/02/2018   BILITOT 0.3 08/02/2018   ALKPHOS 72 03/04/2017   AST 18 08/02/2018   ALT 27 08/02/2018   PROT 7.4 08/02/2018   ALBUMIN 4.6 03/04/2017   CALCIUM 9.5 08/02/2018   GFRAA 88 08/02/2018   QFTBGOLDPLUS NEGATIVE 08/02/2018    Speciality Comments: No specialty comments available.  Procedures:  No procedures performed Allergies: Sulfur   Assessment / Plan:     Visit Diagnoses: Rheumatoid arthritis involving multiple sites with positive rheumatoid factor (HCC)-patient complains of increased pain and discomfort in her hands and feet.  She believes that she has intermittent swelling in her joints.  No synovitis was noted  today.  High risk medication use - Humira 40 mg sq q o wk, MTX 8 tabs po q wk, folic acid 2mg  po qd.  Her labs have been stable.  Pain in both hands - Plan: XR Hand 2 View Right, XR Hand 2 View Left.  Her x-rays were consistent with mild osteoarthritis and juxta-articular osteopenia consistent with rheumatoid arthritis and osteoarthritis overlap.  No radiographic progression was noted.  As she is having ongoing discomfort.  Have advised her to schedule ultrasound of bilateral hands to evaluate for synovitis.  At this point she will continue on current combination.  Pain in both feet -she complains of increased feet swelling and discomfort.  No warmth or swelling was noted.  I believe discomfort is coming from underlying osteoarthritis.  Plan: XR Foot 2 Views Right, XR Foot 2 Views Left.  The x-rays did not show any joint space narrowing or erosive changes.  Primary osteoarthritis of both feet-proper fitting shoes were discussed.  Other medical problems are listed as follows:  Primary insomnia  History of migraine  History of hypothyroidism  History of anxiety  Former smoker   Orders: Orders Placed This Encounter  Procedures  . XR Hand 2 View Right  . XR Hand 2 View Left  . XR Foot 2 Views Right  . XR Foot 2 Views Left   No orders of the defined types were placed in this encounter.    Follow-Up Instructions: Return in about 5 months (around 02/11/2019) for Rheumatoid arthritis, Osteoarthritis.   Bo Merino, MD  Note - This record has been created using Editor, commissioning.  Chart creation errors have been sought, but may not always  have been located. Such creation errors do not reflect on  the standard of medical care.

## 2018-09-12 MED FILL — HUMIRA PEN 40 MG/0.4ML PNKT: 40 | 28 days supply | Qty: 2 | Fill #1

## 2018-09-12 MED FILL — SHIPPING COST: 1 days supply | Qty: 1 | Fill #6

## 2018-09-13 ENCOUNTER — Ambulatory Visit (INDEPENDENT_AMBULATORY_CARE_PROVIDER_SITE_OTHER): Payer: 59

## 2018-09-13 ENCOUNTER — Ambulatory Visit (INDEPENDENT_AMBULATORY_CARE_PROVIDER_SITE_OTHER): Payer: Self-pay

## 2018-09-13 ENCOUNTER — Encounter: Payer: Self-pay | Admitting: Rheumatology

## 2018-09-13 ENCOUNTER — Ambulatory Visit: Payer: 59 | Admitting: Rheumatology

## 2018-09-13 VITALS — BP 103/71 | HR 87 | Resp 14 | Ht 63.5 in | Wt 167.6 lb

## 2018-09-13 DIAGNOSIS — M19071 Primary osteoarthritis, right ankle and foot: Secondary | ICD-10-CM

## 2018-09-13 DIAGNOSIS — F5101 Primary insomnia: Secondary | ICD-10-CM | POA: Diagnosis not present

## 2018-09-13 DIAGNOSIS — Z79899 Other long term (current) drug therapy: Secondary | ICD-10-CM | POA: Diagnosis not present

## 2018-09-13 DIAGNOSIS — M79672 Pain in left foot: Secondary | ICD-10-CM

## 2018-09-13 DIAGNOSIS — Z8669 Personal history of other diseases of the nervous system and sense organs: Secondary | ICD-10-CM

## 2018-09-13 DIAGNOSIS — M0579 Rheumatoid arthritis with rheumatoid factor of multiple sites without organ or systems involvement: Secondary | ICD-10-CM

## 2018-09-13 DIAGNOSIS — M79642 Pain in left hand: Secondary | ICD-10-CM

## 2018-09-13 DIAGNOSIS — M79641 Pain in right hand: Secondary | ICD-10-CM | POA: Diagnosis not present

## 2018-09-13 DIAGNOSIS — Z8639 Personal history of other endocrine, nutritional and metabolic disease: Secondary | ICD-10-CM | POA: Diagnosis not present

## 2018-09-13 DIAGNOSIS — M79671 Pain in right foot: Secondary | ICD-10-CM | POA: Diagnosis not present

## 2018-09-13 DIAGNOSIS — Z8659 Personal history of other mental and behavioral disorders: Secondary | ICD-10-CM

## 2018-09-13 DIAGNOSIS — Z87891 Personal history of nicotine dependence: Secondary | ICD-10-CM

## 2018-09-13 DIAGNOSIS — M19072 Primary osteoarthritis, left ankle and foot: Secondary | ICD-10-CM

## 2018-09-13 NOTE — Patient Instructions (Signed)
Standing Labs We placed an order today for your standing lab work.    Please come back and get your standing labs in March and every 3 days  We have open lab Monday through Friday from 8:30-11:30 AM and 1:30-4:00 PM  at the office of Dr. Bo Merino.   You may experience shorter wait times on Monday and Friday afternoons. The office is located at [address], [place], [place], [place] [address] No appointment is necessary.   Labs are drawn by [facility].  You may receive a bill from [facility] for your lab work.  If you wish to have your labs drawn at another location, please call the office 24 hours in advance to send orders.  If you have any questions regarding directions or hours of operation,  please call [phone].   Just as a reminder please drink plenty of water prior to coming for your lab work. Thanks!

## 2018-09-16 MED FILL — traZODone HCL 50 MG TABS: 50 | 90 days supply | Qty: 90 | Fill #1

## 2018-09-19 ENCOUNTER — Telehealth: Payer: Self-pay | Admitting: Rheumatology

## 2018-09-19 MED ORDER — ZOSTER VAC RECOMB ADJUVANTED 50 MCG/0.5ML IM SUSR: 0.5000 mL | Freq: Once | INTRAMUSCULAR | 0 refills | Status: AC

## 2018-09-19 NOTE — Telephone Encounter (Addendum)
Patient advised shringx vaccine prescription sent to the pharmacy

## 2018-09-19 NOTE — Telephone Encounter (Signed)
Anderson Malta, pharmacist at [facility] in [place] left a voicemail stating patient is requesting a Shingrix vaccine.  Anderson Malta states they need a prescription because patient is on a couple of medications (Humira and Methotrexate).  Please call back at [phone]

## 2018-09-20 DIAGNOSIS — G43709 Chronic migraine without aura, not intractable, without status migrainosus: Secondary | ICD-10-CM | POA: Diagnosis not present

## 2018-09-26 ENCOUNTER — Telehealth: Payer: Self-pay | Admitting: Pharmacist

## 2018-09-26 NOTE — Telephone Encounter (Signed)
Received fax from [facility] with Shingrix vaccine administration information.  Updated immunization record.  Will send document to scan center.

## 2018-10-04 MED FILL — LEVOTHYROXINE 88 MCG TABLET: 88 | 30 days supply | Qty: 30 | Fill #1

## 2018-10-05 ENCOUNTER — Ambulatory Visit (INDEPENDENT_AMBULATORY_CARE_PROVIDER_SITE_OTHER): Payer: 59 | Admitting: Rheumatology

## 2018-10-05 ENCOUNTER — Other Ambulatory Visit: Payer: 59 | Admitting: Rheumatology

## 2018-10-05 ENCOUNTER — Ambulatory Visit (INDEPENDENT_AMBULATORY_CARE_PROVIDER_SITE_OTHER): Payer: Self-pay

## 2018-10-05 ENCOUNTER — Other Ambulatory Visit: Payer: Self-pay

## 2018-10-05 DIAGNOSIS — M79642 Pain in left hand: Secondary | ICD-10-CM

## 2018-10-05 DIAGNOSIS — M79641 Pain in right hand: Secondary | ICD-10-CM

## 2018-10-05 DIAGNOSIS — Z79899 Other long term (current) drug therapy: Secondary | ICD-10-CM | POA: Diagnosis not present

## 2018-10-06 LAB — CBC WITH DIFFERENTIAL/PLATELET
Absolute Monocytes: 342 cells/uL (ref 200–950)
Basophils Absolute: 41 cells/uL (ref 0–200)
Basophils Relative: 0.9 %
Eosinophils Absolute: 59 cells/uL (ref 15–500)
Eosinophils Relative: 1.3 %
HCT: 36.3 % (ref 35.0–45.0)
Hemoglobin: 12.5 g/dL (ref 11.7–15.5)
Lymphs Abs: 1589 cells/uL (ref 850–3900)
MCH: 33.6 pg — ABNORMAL HIGH (ref 27.0–33.0)
MCHC: 34.4 g/dL (ref 32.0–36.0)
MCV: 97.6 fL (ref 80.0–100.0)
MPV: 10.3 fL (ref 7.5–12.5)
Monocytes Relative: 7.6 %
Neutro Abs: 2471 cells/uL (ref 1500–7800)
Neutrophils Relative %: 54.9 %
Platelets: 243 10*3/uL (ref 140–400)
RBC: 3.72 10*6/uL — ABNORMAL LOW (ref 3.80–5.10)
RDW: 12.8 % (ref 11.0–15.0)
Total Lymphocyte: 35.3 %
WBC: 4.5 10*3/uL (ref 3.8–10.8)

## 2018-10-06 LAB — COMPLETE METABOLIC PANEL WITH GFR
AG Ratio: 1.7 (calc) (ref 1.0–2.5)
ALKALINE PHOSPHATASE (APISO): 73 U/L (ref 37–153)
ALT: 19 U/L (ref 6–29)
AST: 19 U/L (ref 10–35)
Albumin: 4.2 g/dL (ref 3.6–5.1)
BILIRUBIN TOTAL: 0.3 mg/dL (ref 0.2–1.2)
BUN: 13 mg/dL (ref 7–25)
CHLORIDE: 111 mmol/L — AB (ref 98–110)
CO2: 22 mmol/L (ref 20–32)
Calcium: 9.2 mg/dL (ref 8.6–10.4)
Creat: 0.94 mg/dL (ref 0.50–1.05)
GFR, Est African American: 81 mL/min/{1.73_m2} (ref >=60)
GFR, Est Non African American: 70 mL/min/{1.73_m2} (ref >=60)
Globulin: 2.5 g/dL (calc) (ref 1.9–3.7)
Glucose, Bld: 85 mg/dL (ref 65–99)
POTASSIUM: 4.1 mmol/L (ref 3.5–5.3)
Sodium: 142 mmol/L (ref 135–146)
Total Protein: 6.7 g/dL (ref 6.1–8.1)

## 2018-10-06 NOTE — Progress Notes (Signed)
WNLs

## 2018-10-10 MED FILL — HUMIRA PEN 40 MG/0.4ML PNKT: 40 | 28 days supply | Qty: 2 | Fill #2

## 2018-10-10 MED FILL — SHIPPING COST: 1 days supply | Qty: 1 | Fill #7

## 2018-10-25 ENCOUNTER — Other Ambulatory Visit: Payer: Self-pay | Admitting: Rheumatology

## 2018-10-25 MED FILL — TOPIRAMATE 100 MG TABLET: 100 | 90 days supply | Qty: 180 | Fill #1

## 2018-10-25 MED FILL — SUMATRIPTAN SUCC 50 MG TAB: 50 | 30 days supply | Qty: 12 | Fill #1

## 2018-10-25 MED FILL — METHOTREXATE SODIUM 2.5 MG: 2.5 | 90 days supply | Qty: 96 | Fill #0

## 2018-10-25 NOTE — Telephone Encounter (Signed)
Last Visit: 09/13/18 Next Visit: 02/14/19 Labs: 10/05/18 WNL  Okay to refill per Dr. Estanislado Pandy

## 2018-10-29 ENCOUNTER — Other Ambulatory Visit: Payer: Self-pay | Admitting: Rheumatology

## 2018-10-31 ENCOUNTER — Other Ambulatory Visit: Payer: Self-pay | Admitting: Pharmacist

## 2018-10-31 MED ORDER — ADALIMUMAB 40 MG/0.4ML ~~LOC~~ AJKT: 40.0000 mg | AUTO-INJECTOR | SUBCUTANEOUS | 0 refills | Status: DC

## 2018-10-31 NOTE — Telephone Encounter (Signed)
Last Visit: 09/13/18 Next Visit: 02/14/19 Labs: 10/05/18 WNL TB Gold: 07/21/18 Neg   Okay to refill per Dr. Estanislado Pandy

## 2018-11-07 MED FILL — HUMIRA PEN 40 MG/0.4ML PNKT: 40 | 28 days supply | Qty: 2 | Fill #0

## 2018-11-17 DIAGNOSIS — G43709 Chronic migraine without aura, not intractable, without status migrainosus: Secondary | ICD-10-CM | POA: Diagnosis not present

## 2018-11-22 ENCOUNTER — Encounter: Payer: Self-pay | Admitting: Rheumatology

## 2018-11-22 NOTE — Telephone Encounter (Signed)
She should not have direct patient contact. Please send her the form.Thanks

## 2018-11-24 DIAGNOSIS — F5109 Other insomnia not due to a substance or known physiological condition: Secondary | ICD-10-CM | POA: Diagnosis not present

## 2018-11-24 DIAGNOSIS — F419 Anxiety disorder, unspecified: Secondary | ICD-10-CM | POA: Diagnosis not present

## 2018-11-24 DIAGNOSIS — E039 Hypothyroidism, unspecified: Secondary | ICD-10-CM | POA: Diagnosis not present

## 2018-11-24 DIAGNOSIS — N951 Menopausal and female climacteric states: Secondary | ICD-10-CM | POA: Diagnosis not present

## 2018-11-24 MED FILL — VENLAFAXINE HCL ER 75 MG CA: 75 | 90 days supply | Qty: 270 | Fill #0

## 2018-11-24 NOTE — Telephone Encounter (Signed)
I have written the following note to patient.  I can only write on the form for you to avoid direct patient care.  It will be between you and your supervisor to make the decision to see patients whom you assume are not COVID positive.  As you know, with positive patients can be asymptomatic. Patient may come and pick up the form when ready.

## 2018-11-25 DIAGNOSIS — E039 Hypothyroidism, unspecified: Secondary | ICD-10-CM | POA: Diagnosis not present

## 2018-11-30 MED FILL — LEVOTHYROXINE 88 MCG TABLET: 88 | 90 days supply | Qty: 90 | Fill #0

## 2018-12-05 MED FILL — FOLIC ACID 1 MG TABS: 1 | 90 days supply | Qty: 180 | Fill #2

## 2018-12-05 MED FILL — HUMIRA PEN 40 MG/0.4ML PNKT: 40 | 28 days supply | Qty: 2 | Fill #1

## 2018-12-27 DIAGNOSIS — G43709 Chronic migraine without aura, not intractable, without status migrainosus: Secondary | ICD-10-CM | POA: Diagnosis not present

## 2018-12-27 MED FILL — SUMATRIPTAN SUCC 50 MG TAB: 50 | 30 days supply | Qty: 18 | Fill #0

## 2018-12-27 MED FILL — AMITRIPTYLINE HCL 50 MG TAB: 50 | 90 days supply | Qty: 90 | Fill #0

## 2019-01-02 MED FILL — HUMIRA PEN 40 MG/0.4ML PNKT: 40 | 28 days supply | Qty: 2 | Fill #2

## 2019-01-27 ENCOUNTER — Other Ambulatory Visit: Payer: Self-pay | Admitting: Rheumatology

## 2019-01-27 MED ORDER — METHOTREXATE SODIUM 2.5 MG PO TABS: 20.0000 mg | ORAL_TABLET | ORAL | 0 refills | Status: DC

## 2019-01-27 MED FILL — METHOTREXATE SODIUM 2.5 MG: 2.5 | 27 days supply | Qty: 32 | Fill #0

## 2019-01-27 NOTE — Telephone Encounter (Addendum)
Last Visit: 09/13/18 Next Visit: 02/14/19 Labs: 10/05/18 WNL TB Gold: 07/21/18 Neg   Patient advised she is due to update labs. Patient will update 01/30/19. Patient states she also needs refill on MTX   Okay to refill 30 day supply per Dr. Estanislado Pandy

## 2019-01-30 ENCOUNTER — Other Ambulatory Visit: Payer: Self-pay | Admitting: Pharmacist

## 2019-01-30 MED ORDER — HUMIRA (2 PEN) 40 MG/0.4ML ~~LOC~~ AJKT: 40.0000 mg | AUTO-INJECTOR | SUBCUTANEOUS | 0 refills | Status: DC

## 2019-01-31 ENCOUNTER — Other Ambulatory Visit: Payer: Self-pay

## 2019-01-31 DIAGNOSIS — Z79899 Other long term (current) drug therapy: Secondary | ICD-10-CM | POA: Diagnosis not present

## 2019-01-31 MED FILL — HUMIRA PEN 40 MG/0.4ML PNKT: 40 | 28 days supply | Qty: 2 | Fill #0

## 2019-01-31 NOTE — Progress Notes (Signed)
Office Visit Note  Patient: Courtney Leonard             Date of Birth: [birth year]           MRN: [id number]             PCP: Deborah Chalk, FNP Referring: Deborah Chalk, FNP Visit Date: 02/14/2019 Occupation: @GUAROCC @  Subjective:  Medication monitoring   History of Present Illness: Courtney Leonard is a 53 y.o. female with history of seropositive rheumatoid arthritis and osteoarthritis.She is taking Humira 40 mg every 14 days, methotrexate 2.5 mg 8 tablets every 7 days, folic acid 1 mg 2 tablets daily.  She denies missing any doses.  She denies any recent infections.  She denies any joint pain or swelling.  She received the flu vaccine in the fall and has an appointment to receive the second dose of Shingrix.  Her last skin exam was 2 years ago.  Activities of Daily Living:  Patient reports morning stiffness for 0 minutes.   Patient Denies nocturnal pain.  Difficulty dressing/grooming: Denies Difficulty climbing stairs: Denies Difficulty getting out of chair: Denies Difficulty using hands for taps, buttons, cutlery, and/or writing: Denies  Review of Systems  Constitutional: Negative for fatigue, night sweats, weight gain and weight loss.  HENT: Negative for mouth sores, trouble swallowing, trouble swallowing, mouth dryness and nose dryness.   Eyes: Negative for pain, redness, visual disturbance and dryness.  Respiratory: Negative for cough, shortness of breath and difficulty breathing.   Cardiovascular: Negative for chest pain, palpitations, hypertension, irregular heartbeat and swelling in legs/feet.  Gastrointestinal: Negative for abdominal pain, blood in stool, constipation and diarrhea.  Endocrine: Negative for cold intolerance and increased urination.  Genitourinary: Negative for vaginal dryness.  Musculoskeletal: Negative for arthralgias, joint pain, joint swelling, myalgias, muscle weakness, morning stiffness, muscle tenderness and myalgias.  Skin: Negative for  color change, rash, hair loss, skin tightness, ulcers and sensitivity to sunlight.  Allergic/Immunologic: Negative for susceptible to infections.  Neurological: Positive for headaches. Negative for dizziness, memory loss, night sweats and weakness.  Hematological: Negative for swollen glands.  Psychiatric/Behavioral: Positive for depressed mood. Negative for sleep disturbance. The patient is nervous/anxious.     PMFS History:  Patient Active Problem List   Diagnosis Date Noted  . Rheumatoid arthritis involving multiple sites with positive rheumatoid factor ([place]) 06/13/2016  . High risk medication use 06/13/2016  . Primary osteoarthritis of both feet 06/13/2016  . Insomnia 06/13/2016  . Acquired hypothyroidism 06/13/2016  . Migraine without status migrainosus, not intractable 06/13/2016  . Former smoker 06/13/2016  . Anxiety 01/08/2011    Past Medical History:  Diagnosis Date  . Anal fissure   . Anxiety   . Arthritis   . Depression   . Hypothyroidism   . Insomnia   . Migraine   . Osteoarthritis   . Rheumatoid arthritis ([place])   . Thyroid disease     Family History  Problem Relation Age of Onset  . Diabetes Mother   . Hypertension Mother   . Atrial fibrillation Father   . Prostate cancer Father    Past Surgical History:  Procedure Laterality Date  . ABDOMINAL HYSTERECTOMY    . CESAREAN SECTION    . HEMORRHOID SURGERY    . KNEE ARTHROPLASTY    . KNEE SURGERY     RT knee  . laproscopy    . ROBOTIC ASSISTED SALPINGO OOPHERECTOMY Left 09/12/2014   Procedure: ROBOTIC ASSISTED Left SALPINGO OOPHORECTOMY/Right Salpingectomy/Pelvic Washings;  Surgeon: Princess Bruins, MD;  Location: [facility];  Service: Gynecology;  Laterality: Left;   Social History   Social History Narrative  . Not on file   Immunization History  Administered Date(s) Administered  . Hepatitis B 11/08/2009, 12/11/2009  . Influenza,trivalent, recombinat, inj, PF 05/17/2006, 05/03/2013  .  Influenza-Unspecified 05/21/2017, 05/01/2018  . Pneumococcal Polysaccharide-23 04/10/2016  . Td 06/26/2003  . Tdap 03/06/2010, 01/13/2016  . Varicella 12/25/2009, 01/24/2010  . Zoster Recombinat (Shingrix) 09/24/2018     Objective: Vital Signs: BP 109/76 (BP Location: Left Arm, Patient Position: Sitting, Cuff Size: Normal)   Pulse 89   Resp 13   Ht 5' 3.5" (1.613 m)   Wt 171 lb 9.6 oz (77.8 kg)   BMI 29.92 kg/m    Physical Exam Vitals signs and nursing note reviewed.  Constitutional:      Appearance: She is well-developed.  HENT:     Head: Normocephalic and atraumatic.  Eyes:     Conjunctiva/sclera: Conjunctivae normal.  Neck:     Musculoskeletal: Normal range of motion.  Cardiovascular:     Rate and Rhythm: Normal rate and regular rhythm.     Heart sounds: Normal heart sounds.  Pulmonary:     Effort: Pulmonary effort is normal.     Breath sounds: Normal breath sounds.  Abdominal:     General: Bowel sounds are normal.     Palpations: Abdomen is soft.  Lymphadenopathy:     Cervical: No cervical adenopathy.  Skin:    General: Skin is warm and dry.     Capillary Refill: Capillary refill takes less than 2 seconds.  Neurological:     Mental Status: She is alert and oriented to person, place, and time.  Psychiatric:        Behavior: Behavior normal.    Musculoskeletal Exam: C-spine thoracic and lumbar spine with good range of motion.  Shoulder joints elbow joints wrist joint MCPs PIPs DIPs with good range of motion with no synovitis.  Hip joints knee joints ankles MTPs PIPs with good range of motion with no synovitis.  CDAI Exam: CDAI Score: 0.2  Patient Global: 1 mm; Provider Global: 1 mm Swollen: 0 ; Tender: 0  Joint Exam   No joint exam has been documented for this visit   There is currently no information documented on the homunculus. Go to the Rheumatology activity and complete the homunculus joint exam.  Investigation: No additional findings.  Imaging:  No results found.  Recent Labs: Lab Results  Component Value Date   WBC 5.7 01/31/2019   HGB 13.1 01/31/2019   PLT 229 01/31/2019   NA 141 01/31/2019   K 4.1 01/31/2019   CL 107 01/31/2019   CO2 25 01/31/2019   GLUCOSE 93 01/31/2019   BUN 15 01/31/2019   CREATININE 0.82 01/31/2019   BILITOT 0.2 01/31/2019   ALKPHOS 72 03/04/2017   AST 15 01/31/2019   ALT 14 01/31/2019   PROT 7.3 01/31/2019   ALBUMIN 4.6 03/04/2017   CALCIUM 9.4 01/31/2019   GFRAA 96 01/31/2019   QFTBGOLDPLUS NEGATIVE 08/02/2018    Speciality Comments: No specialty comments available.  Procedures:  No procedures performed Allergies: Sulfur   Assessment / Plan:     Visit Diagnoses: Rheumatoid arthritis involving multiple sites with positive rheumatoid factor ([place]) -patient had no synovitis on examination.  She does not recall when the last time she had a rheumatoid arthritis flare.  I discussed spacing Humira to every 3 weeks of tolerated.  Patient was  in agreement.  If she starts having increased symptoms prior to the next dose of Humira we will go back to every other week dosing.  High risk medication use -  Humira 40 mg every 14 days, methotrexate 2.5 mg 8 tablets every 7 days, folic acid 1 mg 2 tablets daily.  Last TB gold negative on 08/02/2018 and will monitor yearly.  Most recent CBC/CMP within normal limits on 01/31/2019.  Due for CBC/CMP in September and will monitor every 3 months.  Standing orders are in place.  She received the flu vaccine in September and previously Pneumovax 23 and 1 dose of Shingrix.  Recommend annual flu, Prevnar 13, and second dose of Shingrix as indicated for immunosuppressant therapy.  Recommend yearly skin exams due to increased risk of melanoma.  Primary osteoarthritis of both feet -she is currently not having much discomfort.  History of anxiety - Plan: She is on Xanax and Elavil.  History of migraine - Plan: She has been experiencing increased headaches lately.  She has  been followed by neurologist.  History of hypothyroidism  Former smoker  Primary insomnia -she is doing better with the medication.  Orders: No orders of the defined types were placed in this encounter.  Meds ordered this encounter  Medications  . folic acid (FOLVITE) 1 MG tablet    Sig: Take 2 tablets (2 mg total) by mouth daily.    Dispense:  180 tablet    Refill:  3  . methotrexate 2.5 MG tablet    Sig: Take 8 tablets (20 mg total) by mouth once a week.    Dispense:  96 tablet    Refill:  0     Follow-Up Instructions: Return in about 5 months (around 07/17/2019) for Rheumatoid arthritis, Osteoarthritis.   Bo Merino, MD  Note - This record has been created using Editor, commissioning.  Chart creation errors have been sought, but may not always  have been located. Such creation errors do not reflect on  the standard of medical care.

## 2019-02-01 LAB — COMPLETE METABOLIC PANEL WITH GFR
AG Ratio: 1.6 (calc) (ref 1.0–2.5)
ALT: 14 U/L (ref 6–29)
AST: 15 U/L (ref 10–35)
Albumin: 4.5 g/dL (ref 3.6–5.1)
Alkaline phosphatase (APISO): 81 U/L (ref 37–153)
BUN: 15 mg/dL (ref 7–25)
CO2: 25 mmol/L (ref 20–32)
Calcium: 9.4 mg/dL (ref 8.6–10.4)
Chloride: 107 mmol/L (ref 98–110)
Creat: 0.82 mg/dL (ref 0.50–1.05)
GFR, Est African American: 96 mL/min/{1.73_m2} (ref >=60)
GFR, Est Non African American: 83 mL/min/{1.73_m2} (ref >=60)
Globulin: 2.8 g/dL (calc) (ref 1.9–3.7)
Glucose, Bld: 93 mg/dL (ref 65–99)
Potassium: 4.1 mmol/L (ref 3.5–5.3)
Sodium: 141 mmol/L (ref 135–146)
Total Bilirubin: 0.2 mg/dL (ref 0.2–1.2)
Total Protein: 7.3 g/dL (ref 6.1–8.1)

## 2019-02-01 LAB — CBC WITH DIFFERENTIAL/PLATELET
Absolute Monocytes: 399 cells/uL (ref 200–950)
Basophils Absolute: 68 cells/uL (ref 0–200)
Basophils Relative: 1.2 %
Eosinophils Absolute: 51 cells/uL (ref 15–500)
Eosinophils Relative: 0.9 %
HCT: 38.9 % (ref 35.0–45.0)
Hemoglobin: 13.1 g/dL (ref 11.7–15.5)
Lymphs Abs: 2069 cells/uL (ref 850–3900)
MCH: 32.8 pg (ref 27.0–33.0)
MCHC: 33.7 g/dL (ref 32.0–36.0)
MCV: 97.5 fL (ref 80.0–100.0)
MPV: 10.5 fL (ref 7.5–12.5)
Monocytes Relative: 7 %
Neutro Abs: 3112 cells/uL (ref 1500–7800)
Neutrophils Relative %: 54.6 %
Platelets: 229 10*3/uL (ref 140–400)
RBC: 3.99 10*6/uL (ref 3.80–5.10)
RDW: 12.7 % (ref 11.0–15.0)
Total Lymphocyte: 36.3 %
WBC: 5.7 10*3/uL (ref 3.8–10.8)

## 2019-02-01 NOTE — Progress Notes (Signed)
WNLs

## 2019-02-14 ENCOUNTER — Encounter: Payer: Self-pay | Admitting: Rheumatology

## 2019-02-14 ENCOUNTER — Ambulatory Visit: Payer: 59 | Admitting: Rheumatology

## 2019-02-14 ENCOUNTER — Other Ambulatory Visit: Payer: Self-pay

## 2019-02-14 VITALS — BP 109/76 | HR 89 | Resp 13 | Ht 63.5 in | Wt 171.6 lb

## 2019-02-14 DIAGNOSIS — M19071 Primary osteoarthritis, right ankle and foot: Secondary | ICD-10-CM

## 2019-02-14 DIAGNOSIS — M19072 Primary osteoarthritis, left ankle and foot: Secondary | ICD-10-CM

## 2019-02-14 DIAGNOSIS — Z8659 Personal history of other mental and behavioral disorders: Secondary | ICD-10-CM | POA: Diagnosis not present

## 2019-02-14 DIAGNOSIS — F5101 Primary insomnia: Secondary | ICD-10-CM

## 2019-02-14 DIAGNOSIS — Z8669 Personal history of other diseases of the nervous system and sense organs: Secondary | ICD-10-CM | POA: Diagnosis not present

## 2019-02-14 DIAGNOSIS — Z79899 Other long term (current) drug therapy: Secondary | ICD-10-CM

## 2019-02-14 DIAGNOSIS — Z87891 Personal history of nicotine dependence: Secondary | ICD-10-CM | POA: Diagnosis not present

## 2019-02-14 DIAGNOSIS — M0579 Rheumatoid arthritis with rheumatoid factor of multiple sites without organ or systems involvement: Secondary | ICD-10-CM | POA: Diagnosis not present

## 2019-02-14 DIAGNOSIS — Z8639 Personal history of other endocrine, nutritional and metabolic disease: Secondary | ICD-10-CM

## 2019-02-14 MED ORDER — FOLIC ACID 1 MG PO TABS: 2.0000 mg | ORAL_TABLET | Freq: Every day | ORAL | 3 refills | Status: DC

## 2019-02-14 MED ORDER — METHOTREXATE SODIUM 2.5 MG PO TABS: 20.0000 mg | ORAL_TABLET | ORAL | 0 refills | Status: DC

## 2019-02-14 MED FILL — traZODone HCL 50 MG TABS: 50 | 90 days supply | Qty: 90 | Fill #0

## 2019-02-14 MED FILL — TOPIRAMATE 100 MG TABLET: 100 | 90 days supply | Qty: 180 | Fill #0

## 2019-02-14 MED FILL — SUMATRIPTAN SUCC 50 MG TAB: 50 | 30 days supply | Qty: 18 | Fill #1

## 2019-02-14 NOTE — Patient Instructions (Addendum)
Standing Labs We placed an order today for your standing lab work.    Please come back and get your standing labs in September and then   We have open lab daily Monday through Thursday from 8:30-12:30 PM and 1:30-4:30 PM and Friday from 8:30-12:30 PM and 1:30 -4:00 PM at the office of Dr. Bo Merino.   You may experience shorter wait times on Monday and Friday afternoons. The office is located at [address] [place], [place], [place] [address] No appointment is necessary.   Labs are drawn by [facility].  You may receive a bill from [facility] for your lab work.  If you wish to have your labs drawn at another location, please call the office 24 hours in advance to send orders.  If you have any questions regarding directions or hours of operation,  please call [phone].   Just as a reminder please drink plenty of water prior to coming for your lab work. Thanks!  Vaccines You are taking a medication(s) that can suppress your immune system.  The following immunizations are recommended: . Flu annually . Pneumonia (Pneumovax 23 and Prevnar 13 spaced at least 1 year apart) . Shingrix   Please check with your PCP to make sure you are up to date.  In addition to staying up to date on lab work and vaccines it is important to:  Marland Kitchen Yearly skin checks with dermatologist due to increase risk in skin cancer with TNF inhibitors .

## 2019-02-24 ENCOUNTER — Other Ambulatory Visit: Payer: Self-pay | Admitting: Pharmacist

## 2019-02-24 ENCOUNTER — Other Ambulatory Visit: Payer: Self-pay | Admitting: Rheumatology

## 2019-02-24 MED ORDER — HUMIRA (2 PEN) 40 MG/0.4ML ~~LOC~~ AJKT: 40.0000 mg | AUTO-INJECTOR | SUBCUTANEOUS | 0 refills | Status: DC

## 2019-02-24 NOTE — Telephone Encounter (Signed)
Last visit: 02/14/19 Next Visit: 07/19/19 Labs: 01/31/19 WNL TB Gold: 08/02/18 neg   Okay to refill per Dr. Estanislado Pandy

## 2019-03-02 MED FILL — HUMIRA PEN 40 MG/0.4ML PNKT: 40 | 28 days supply | Qty: 2 | Fill #0

## 2019-03-09 MED FILL — VENLAFAXINE HCL ER 75 MG CA: 75 | 90 days supply | Qty: 270 | Fill #1

## 2019-03-09 MED FILL — LEVOTHYROXINE 88 MCG TABLET: 88 | 90 days supply | Qty: 90 | Fill #1

## 2019-03-09 MED FILL — METHOTREXATE SODIUM 2.5 MG: 2.5 | 84 days supply | Qty: 96 | Fill #0

## 2019-03-09 MED FILL — FOLIC ACID 1 MG TABS: 1 | 90 days supply | Qty: 180 | Fill #0

## 2019-03-30 DIAGNOSIS — G43709 Chronic migraine without aura, not intractable, without status migrainosus: Secondary | ICD-10-CM | POA: Diagnosis not present

## 2019-04-05 MED FILL — HUMIRA PEN 40 MG/0.4ML PNKT: 40 | 28 days supply | Qty: 2 | Fill #1

## 2019-04-20 ENCOUNTER — Other Ambulatory Visit: Payer: Self-pay

## 2019-04-20 DIAGNOSIS — Z79899 Other long term (current) drug therapy: Secondary | ICD-10-CM

## 2019-04-21 LAB — COMPLETE METABOLIC PANEL WITH GFR
AG Ratio: 1.7 (calc) (ref 1.0–2.5)
ALT: 18 U/L (ref 6–29)
AST: 19 U/L (ref 10–35)
Albumin: 4.4 g/dL (ref 3.6–5.1)
Alkaline phosphatase (APISO): 74 U/L (ref 37–153)
BUN: 15 mg/dL (ref 7–25)
CO2: 24 mmol/L (ref 20–32)
Calcium: 9.4 mg/dL (ref 8.6–10.4)
Chloride: 108 mmol/L (ref 98–110)
Creat: 0.82 mg/dL (ref 0.50–1.05)
GFR, Est African American: 95 mL/min/{1.73_m2} (ref >=60)
GFR, Est Non African American: 82 mL/min/{1.73_m2} (ref >=60)
Globulin: 2.6 g/dL (calc) (ref 1.9–3.7)
Glucose, Bld: 75 mg/dL (ref 65–99)
Potassium: 4.2 mmol/L (ref 3.5–5.3)
Sodium: 141 mmol/L (ref 135–146)
Total Bilirubin: 0.3 mg/dL (ref 0.2–1.2)
Total Protein: 7 g/dL (ref 6.1–8.1)

## 2019-04-21 LAB — CBC WITH DIFFERENTIAL/PLATELET
Absolute Monocytes: 331 cells/uL (ref 200–950)
Basophils Absolute: 41 cells/uL (ref 0–200)
Basophils Relative: 0.9 %
Eosinophils Absolute: 60 cells/uL (ref 15–500)
Eosinophils Relative: 1.3 %
HCT: 39.1 % (ref 35.0–45.0)
Hemoglobin: 13.1 g/dL (ref 11.7–15.5)
Lymphs Abs: 1716 cells/uL (ref 850–3900)
MCH: 33 pg (ref 27.0–33.0)
MCHC: 33.5 g/dL (ref 32.0–36.0)
MCV: 98.5 fL (ref 80.0–100.0)
MPV: 10.6 fL (ref 7.5–12.5)
Monocytes Relative: 7.2 %
Neutro Abs: 2452 cells/uL (ref 1500–7800)
Neutrophils Relative %: 53.3 %
Platelets: 226 10*3/uL (ref 140–400)
RBC: 3.97 10*6/uL (ref 3.80–5.10)
RDW: 13 % (ref 11.0–15.0)
Total Lymphocyte: 37.3 %
WBC: 4.6 10*3/uL (ref 3.8–10.8)

## 2019-04-21 NOTE — Progress Notes (Signed)
WNL

## 2019-05-04 DIAGNOSIS — G43709 Chronic migraine without aura, not intractable, without status migrainosus: Secondary | ICD-10-CM | POA: Diagnosis not present

## 2019-05-04 MED FILL — SUMATRIPTAN SUCC 50 MG TAB: 50 | 21 days supply | Qty: 12 | Fill #0

## 2019-05-04 MED FILL — SUMATRIPTAN SUCCINATE REFIL: 4 | 84 days supply | Qty: 6 | Fill #0

## 2019-05-09 MED FILL — HUMIRA PEN 40 MG/0.4ML PNKT: 40 | 28 days supply | Qty: 2 | Fill #2

## 2019-05-16 ENCOUNTER — Telehealth: Payer: Self-pay | Admitting: Pharmacy Technician

## 2019-05-16 MED FILL — SUMATRIPTAN SUCCINATE REFIL: 4 | 84 days supply | Qty: 6 | Fill #0

## 2019-05-16 MED FILL — SUMATRIPTAN SUCCINATE 4 MG/: 4 | 7 days supply | Qty: 1 | Fill #0

## 2019-05-16 NOTE — Telephone Encounter (Signed)
Submitted a Prior Authorization request to [facility] for HUMIRA via Cover My Meds. Will update once we receive a response.

## 2019-05-19 NOTE — Telephone Encounter (Signed)
Received notification from [facility] regarding a prior authorization for HUMIRA. Authorization has been APPROVED from 06/17/2019 to 06/15/2020. Approved for 12 fills.   Will send document to scan center.  Authorization # [id number]   *Patient has a current auth that expires on 06/16/2019

## 2019-05-25 DIAGNOSIS — N951 Menopausal and female climacteric states: Secondary | ICD-10-CM | POA: Diagnosis not present

## 2019-05-25 DIAGNOSIS — E039 Hypothyroidism, unspecified: Secondary | ICD-10-CM | POA: Diagnosis not present

## 2019-05-25 DIAGNOSIS — F5109 Other insomnia not due to a substance or known physiological condition: Secondary | ICD-10-CM | POA: Diagnosis not present

## 2019-05-25 DIAGNOSIS — F419 Anxiety disorder, unspecified: Secondary | ICD-10-CM | POA: Diagnosis not present

## 2019-05-25 MED FILL — VENLAFAXINE HCL ER 75 MG CA: 75 | 90 days supply | Qty: 270 | Fill #0

## 2019-05-25 MED FILL — traZODone HCL 50 MG TABS: 50 | 90 days supply | Qty: 90 | Fill #0

## 2019-05-26 ENCOUNTER — Other Ambulatory Visit: Payer: Self-pay | Admitting: Rheumatology

## 2019-05-26 MED FILL — LEVOTHYROXINE 88 MCG TABLET: 88 | 90 days supply | Qty: 90 | Fill #0

## 2019-05-29 MED FILL — METHOTREXATE SODIUM 2.5 MG: 2.5 | 84 days supply | Qty: 96 | Fill #0

## 2019-05-29 NOTE — Telephone Encounter (Signed)
Last visit: 02/14/19 Next Visit: 07/19/19 Labs: 04/20/19 WNL  Okay to refill per Dr. Estanislado Pandy

## 2019-06-01 ENCOUNTER — Other Ambulatory Visit: Payer: Self-pay | Admitting: Rheumatology

## 2019-06-01 ENCOUNTER — Other Ambulatory Visit: Payer: Self-pay | Admitting: Internal Medicine

## 2019-06-01 MED FILL — FOLIC ACID 1 MG TABS: 1 | 90 days supply | Qty: 180 | Fill #1

## 2019-06-01 MED FILL — TOPIRAMATE 100 MG TABLET: 100 | 90 days supply | Qty: 180 | Fill #1

## 2019-06-01 NOTE — Telephone Encounter (Addendum)
Last visit: 02/14/19 Next Visit: 07/19/19 Labs: 04/20/19 WNL TB Gold: 08/02/18 neg   Okay to refill per Dr. Estanislado Pandy

## 2019-06-02 ENCOUNTER — Other Ambulatory Visit: Payer: Self-pay | Admitting: Pharmacist

## 2019-06-02 MED ORDER — HUMIRA (2 PEN) 40 MG/0.4ML ~~LOC~~ AJKT: 40.0000 mg | AUTO-INJECTOR | SUBCUTANEOUS | 0 refills | Status: DC

## 2019-06-09 ENCOUNTER — Telehealth (INDEPENDENT_AMBULATORY_CARE_PROVIDER_SITE_OTHER): Payer: 59 | Admitting: Pharmacist

## 2019-06-09 DIAGNOSIS — Z79899 Other long term (current) drug therapy: Secondary | ICD-10-CM

## 2019-06-09 NOTE — Progress Notes (Signed)
   S: Patient presents for review of her specialty medication.  Patient is currently taking Humira for rheumatoid arthritis. Patient is managed by Dr. Estanislado Pandy for this.   Adherence: denies any missed doses.  Efficacy: reports that Humira continues to work well for her.   Drug-drug interactions: none  Screening: TB test: completed per patient Hepatitis: completed per patient  Monitoring: S/sx of infection: denies CBC: see below S/sx of hypersensitivity: denies S/sx of malignancy: denies S/sx of heart failure: denies  O:     Lab Results  Component Value Date   WBC 4.6 04/20/2019   HGB 13.1 04/20/2019   HCT 39.1 04/20/2019   MCV 98.5 04/20/2019   PLT 226 04/20/2019      Chemistry      Component Value Date/Time   NA 141 04/20/2019 1221   NA 139 04/20/2016   K 4.2 04/20/2019 1221   CL 108 04/20/2019 1221   CO2 24 04/20/2019 1221   BUN 15 04/20/2019 1221   BUN 20 04/20/2016   CREATININE 0.82 04/20/2019 1221   GLU 93 04/20/2016      Component Value Date/Time   CALCIUM 9.4 04/20/2019 1221   ALKPHOS 72 03/04/2017 1414   AST 19 04/20/2019 1221   ALT 18 04/20/2019 1221   BILITOT 0.3 04/20/2019 1221       A/P: 1. Medication review: Patient currently on Humira for rheumatoid arthritis and is tolerating it well. Denies any adverse effects. Reviewed the medication with the patient, including the following: Humira is a TNF blocking agent indicated for ankylosing spondylitis, Crohn's disease, Hidradenitis suppurativa, psoriatic arthritis, plaque psoriasis, ulcerative colitis, and uveitis. The most common adverse effects are infections, headache, and injection site reactions. There is the possibility of an increased risk of malignancy but it is not well understood if this increased risk is due to there medication or the disease state. There are rare cases of pancytopenia and aplastic anemia. No recommendations for any changes at this time.   Christella Hartigan, PharmD,  BCPS, BCACP, CPP Clinical Pharmacist Practitioner  [phone]

## 2019-06-12 DIAGNOSIS — H5203 Hypermetropia, bilateral: Secondary | ICD-10-CM | POA: Diagnosis not present

## 2019-06-12 DIAGNOSIS — H524 Presbyopia: Secondary | ICD-10-CM | POA: Diagnosis not present

## 2019-06-12 DIAGNOSIS — H52223 Regular astigmatism, bilateral: Secondary | ICD-10-CM | POA: Diagnosis not present

## 2019-06-12 DIAGNOSIS — Z135 Encounter for screening for eye and ear disorders: Secondary | ICD-10-CM | POA: Diagnosis not present

## 2019-06-13 MED FILL — HUMIRA PEN 40 MG/0.4ML PNKT: 40 | 28 days supply | Qty: 2 | Fill #0

## 2019-07-04 DIAGNOSIS — G43709 Chronic migraine without aura, not intractable, without status migrainosus: Secondary | ICD-10-CM | POA: Diagnosis not present

## 2019-07-04 MED FILL — AMITRIPTYLINE HCL 25 MG TAB: 25 | 30 days supply | Qty: 30 | Fill #0

## 2019-07-13 MED FILL — HUMIRA PEN 40 MG/0.4ML PNKT: 40 | 28 days supply | Qty: 2 | Fill #1

## 2019-07-18 NOTE — Progress Notes (Signed)
Virtual Visit via Telephone Note  I connected with Courtney Leonard on 07/19/19 at 10:45 AM EST by telephone and verified that I am speaking with the correct person using two identifiers.  Location: Patient: Home  Provider: Clinic  This service was conducted via virtual visit.  The patient was located at home. I was located in my office.  Consent was obtained prior to the virtual visit and is aware of possible charges through their insurance for this visit.  The patient is an established patient.  Dr. Estanislado Pandy, MD conducted the virtual visit and Hazel Sams, PA-C acted as scribe during the service.  Office staff helped with scheduling follow up visits after the service was conducted.      I discussed the limitations, risks, security and privacy concerns of performing an evaluation and management service by telephone and the availability of in person appointments. I also discussed with the patient that there may be a patient responsible charge related to this service. The patient expressed understanding and agreed to proceed.  CC: medication monitoring  History of Present Illness: Patient is a 53 year old female with a past medical history of seropositive rheumatoid arthritis and osteoarthritis. She is on Humira 40 mg sq injections every 16 days, MTX 8 tablets po once weekly, and folic acid 2 mg po daily.  She has not noticed any increased joint pain or inflammation since spacing the dose of Humira. She has been experiencing joint stiffness intermittently with cooler weather changes.  She had not had any difficulty with ADLs.  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   Review of Systems  Constitutional: Negative for fever and malaise/fatigue.  Eyes: Negative for photophobia, pain, discharge and redness.  Respiratory: Negative for cough, shortness of breath and wheezing.   Cardiovascular: Negative for chest pain and palpitations.  Gastrointestinal: Negative for blood in stool, constipation and diarrhea.   Genitourinary: Negative for dysuria.  Musculoskeletal: Negative for back pain, joint pain, myalgias and neck pain.       +Joint stiffness   Skin: Negative for rash.  Neurological: Negative for dizziness and headaches.  Psychiatric/Behavioral: Negative for depression. The patient is not nervous/anxious and does not have insomnia.       Observations/Objective: Physical Exam  Constitutional: She is oriented to person, place, and time.  Neurological: She is alert and oriented to person, place, and time.  Psychiatric: Mood, memory, affect and judgment normal.   Patient reports morning stiffness for 0 minutes.   Patient denies nocturnal pain.  Difficulty dressing/grooming: Denies Difficulty climbing stairs: Denies Difficulty getting out of chair: Denies Difficulty using hands for taps, buttons, cutlery, and/or writing: Denies   Assessment and Plan: Visit Diagnoses: Rheumatoid arthritis involving multiple sites with positive rheumatoid factor ([place]) -She has not had any recent rheumatoid arthritis flares.  She has no joint pain or joint inflammation at this time.  No morning stiffness. She has intermittent joint stiffness with cooler weather changes.  She has been spacing Humira 40 mg sq injections to every 16 days and continues to take methotrexate 8 tablet by mouth once weekly and folic acid 2 mg po daily. She has not had any new or worsening symptoms since spacing the dose of Humira.  She will further space Humira to every 21 days and continue taking MTX as prescribed. She was advised to notify us if she develops increased joint pain or joint swelling.  She will follow up in 3-4 months.   High risk medication use - Humira  40 mg sq injections every 21 days, methotrexate 2.5 mg 8 tablets every 7 days, folic acid 1 mg 2 tablets daily.  Last TB gold negative on 08/02/2018 and will monitor yearly.  Order will be placed today.  CBC and CMP WNL on 04/20/19.  Standing orders placed today.  Primary  osteoarthritis of both feet: She is not having any feet pain or inflammation at this time.   History of anxiety - She is on Xanax and Elavil.  History of migraine -She receives botox injections and takes topamax as prescribed.   Other medical conditions are listed as follows:   History of hypothyroidism  Former smoker  Primary insomnia    Follow Up Instructions: She will follow up in 3-4 months   I discussed the assessment and treatment plan with the patient. The patient was provided an opportunity to ask questions and all were answered. The patient agreed with the plan and demonstrated an understanding of the instructions.   The patient was advised to call back or seek an in-person evaluation if the symptoms worsen or if the condition fails to improve as anticipated.  I provided 15 minutes of non-face-to-face time during this encounter.   Bo Merino, MD   Scribed by-  Hazel Sams, PA-C

## 2019-07-19 ENCOUNTER — Telehealth (INDEPENDENT_AMBULATORY_CARE_PROVIDER_SITE_OTHER): Payer: 59 | Admitting: Rheumatology

## 2019-07-19 ENCOUNTER — Other Ambulatory Visit: Payer: Self-pay

## 2019-07-19 ENCOUNTER — Encounter: Payer: Self-pay | Admitting: Rheumatology

## 2019-07-19 DIAGNOSIS — Z8639 Personal history of other endocrine, nutritional and metabolic disease: Secondary | ICD-10-CM | POA: Diagnosis not present

## 2019-07-19 DIAGNOSIS — Z87891 Personal history of nicotine dependence: Secondary | ICD-10-CM | POA: Diagnosis not present

## 2019-07-19 DIAGNOSIS — Z79899 Other long term (current) drug therapy: Secondary | ICD-10-CM

## 2019-07-19 DIAGNOSIS — Z8669 Personal history of other diseases of the nervous system and sense organs: Secondary | ICD-10-CM | POA: Diagnosis not present

## 2019-07-19 DIAGNOSIS — M0579 Rheumatoid arthritis with rheumatoid factor of multiple sites without organ or systems involvement: Secondary | ICD-10-CM | POA: Diagnosis not present

## 2019-07-19 DIAGNOSIS — F5101 Primary insomnia: Secondary | ICD-10-CM | POA: Diagnosis not present

## 2019-07-19 DIAGNOSIS — Z8659 Personal history of other mental and behavioral disorders: Secondary | ICD-10-CM

## 2019-07-19 DIAGNOSIS — M19071 Primary osteoarthritis, right ankle and foot: Secondary | ICD-10-CM

## 2019-07-19 DIAGNOSIS — M19072 Primary osteoarthritis, left ankle and foot: Secondary | ICD-10-CM

## 2019-07-26 ENCOUNTER — Telehealth: Payer: Self-pay | Admitting: Rheumatology

## 2019-07-26 NOTE — Telephone Encounter (Signed)
-----   Message from Shona Needles, RT sent at 07/19/2019  2:54 PM EST ----- Regarding: 3-4 MONTH F/U

## 2019-07-26 NOTE — Telephone Encounter (Signed)
Spoke with patient who stated she was at work and would call back after the first of the year to schedule the follow-up appointment.

## 2019-08-14 ENCOUNTER — Other Ambulatory Visit: Payer: Self-pay | Admitting: Rheumatology

## 2019-08-14 ENCOUNTER — Other Ambulatory Visit: Payer: Self-pay

## 2019-08-14 ENCOUNTER — Other Ambulatory Visit: Payer: Self-pay | Admitting: Pharmacist

## 2019-08-14 DIAGNOSIS — Z79899 Other long term (current) drug therapy: Secondary | ICD-10-CM

## 2019-08-14 MED ORDER — HUMIRA (2 PEN) 40 MG/0.4ML ~~LOC~~ AJKT: 40.0000 mg | AUTO-INJECTOR | SUBCUTANEOUS | 0 refills | Status: DC

## 2019-08-14 NOTE — Telephone Encounter (Signed)
Last Visit: 07/19/2019 telemedicine Next Visit: message sent to the front desk to schedule.  Labs: 04/20/2019 WNL  TB Gold: 08/02/2018 negative   Attempted to contact patient and left message on machine to advise patient she is due to update labs (at a [facility]). advised patient we will have a phelbotomost here in the office today only from 1-4pm.   Okay to refill 30 day supply, per Dr. Estanislado Pandy.

## 2019-08-15 NOTE — Progress Notes (Signed)
CMP WNL.  RBC count is mildly low.  We will continue to monitor.  Rest of CBC WNL

## 2019-08-16 LAB — CBC WITH DIFFERENTIAL/PLATELET
Absolute Monocytes: 424 cells/uL (ref 200–950)
Basophils Absolute: 42 cells/uL (ref 0–200)
Basophils Relative: 0.8 %
Eosinophils Absolute: 69 cells/uL (ref 15–500)
Eosinophils Relative: 1.3 %
HCT: 35.7 % (ref 35.0–45.0)
Hemoglobin: 12.1 g/dL (ref 11.7–15.5)
Lymphs Abs: 2226 cells/uL (ref 850–3900)
MCH: 33.2 pg — ABNORMAL HIGH (ref 27.0–33.0)
MCHC: 33.9 g/dL (ref 32.0–36.0)
MCV: 98.1 fL (ref 80.0–100.0)
MPV: 10.3 fL (ref 7.5–12.5)
Monocytes Relative: 8 %
Neutro Abs: 2539 cells/uL (ref 1500–7800)
Neutrophils Relative %: 47.9 %
Platelets: 234 10*3/uL (ref 140–400)
RBC: 3.64 10*6/uL — ABNORMAL LOW (ref 3.80–5.10)
RDW: 12.7 % (ref 11.0–15.0)
Total Lymphocyte: 42 %
WBC: 5.3 10*3/uL (ref 3.8–10.8)

## 2019-08-16 LAB — QUANTIFERON-TB GOLD PLUS
Mitogen-NIL: >10 IU/mL
NIL: 0.08 IU/mL
QuantiFERON-TB Gold Plus: NEGATIVE
TB1-NIL: 0 IU/mL
TB2-NIL: <0 IU/mL

## 2019-08-16 LAB — COMPLETE METABOLIC PANEL WITH GFR
AG Ratio: 1.9 (calc) (ref 1.0–2.5)
ALT: 14 U/L (ref 6–29)
AST: 14 U/L (ref 10–35)
Albumin: 4.3 g/dL (ref 3.6–5.1)
Alkaline phosphatase (APISO): 73 U/L (ref 37–153)
BUN: 14 mg/dL (ref 7–25)
CO2: 22 mmol/L (ref 20–32)
Calcium: 9 mg/dL (ref 8.6–10.4)
Chloride: 108 mmol/L (ref 98–110)
Creat: 0.95 mg/dL (ref 0.50–1.05)
GFR, Est African American: 80 mL/min/{1.73_m2} (ref >=60)
GFR, Est Non African American: 69 mL/min/{1.73_m2} (ref >=60)
Globulin: 2.3 g/dL (calc) (ref 1.9–3.7)
Glucose, Bld: 67 mg/dL (ref 65–99)
Potassium: 4.1 mmol/L (ref 3.5–5.3)
Sodium: 139 mmol/L (ref 135–146)
Total Bilirubin: 0.3 mg/dL (ref 0.2–1.2)
Total Protein: 6.6 g/dL (ref 6.1–8.1)

## 2019-08-17 NOTE — Progress Notes (Signed)
TB gold negative

## 2019-08-31 ENCOUNTER — Other Ambulatory Visit: Payer: Self-pay | Admitting: Rheumatology

## 2019-08-31 MED FILL — AMITRIPTYLINE HCL 25 MG TAB: 25 | 30 days supply | Qty: 30 | Fill #1

## 2019-08-31 MED FILL — TOPIRAMATE 100 MG TABLET: 100 | 90 days supply | Qty: 180 | Fill #2

## 2019-08-31 MED FILL — FOLIC ACID 1 MG TABS: 1 | 90 days supply | Qty: 180 | Fill #2

## 2019-08-31 MED FILL — METHOTREXATE SODIUM 2.5 MG: 2.5 | 84 days supply | Qty: 96 | Fill #0

## 2019-08-31 MED FILL — VENLAFAXINE HCL ER 75 MG CA: 75 | 90 days supply | Qty: 270 | Fill #1

## 2019-08-31 NOTE — Telephone Encounter (Signed)
Please schedule patient for a follow up visit. Patient due March/April 2021. Thanks!

## 2019-08-31 NOTE — Telephone Encounter (Signed)
Last Visit: 07/19/2019 telemedicine Next Visit: due March/April 2021. Message sent to the front to schedule patient  Labs: 08/14/19 CMP WNL. RBC count is mildly low. We will continue to monitor. Rest of CBC WNL

## 2019-09-05 ENCOUNTER — Other Ambulatory Visit: Payer: Self-pay | Admitting: Pharmacist

## 2019-09-05 ENCOUNTER — Telehealth: Payer: Self-pay | Admitting: Pharmacy Technician

## 2019-09-05 DIAGNOSIS — M0579 Rheumatoid arthritis with rheumatoid factor of multiple sites without organ or systems involvement: Secondary | ICD-10-CM

## 2019-09-05 MED ORDER — HUMIRA (2 PEN) 40 MG/0.4ML ~~LOC~~ AJKT: 40.0000 mg | AUTO-INJECTOR | SUBCUTANEOUS | 1 refills | Status: DC

## 2019-09-05 MED FILL — HUMIRA PEN 40 MG/0.4ML PNKT: 40 | 42 days supply | Qty: 2 | Fill #0

## 2019-09-05 NOTE — Progress Notes (Signed)
Request to send in new prescription from pharmacy as insurance requires fill of 2 pens for 42 day supply.  She is up to date with labs.  Sent in new script for 2 pens with 1 refill.  Mariella Saa, PharmD, [place], [place] Clinical Specialty Pharmacist [phone]  09/05/2019 11:03 AM

## 2019-09-05 NOTE — Telephone Encounter (Signed)
Received PA request from pharmacy for patient's new Humira directions. Submitted a Prior Authorization request to [facility] for HUMIRA via Cover My Meds. Will update once we receive a response.

## 2019-09-07 MED FILL — LEVOTHYROXINE SODIUM 88 MCG: 88 | 90 days supply | Qty: 90 | Fill #1

## 2019-09-08 NOTE — Telephone Encounter (Signed)
Received request PA is not needed at this time. Notified pharmacy. Claim went through.

## 2019-09-11 ENCOUNTER — Emergency Department (HOSPITAL_COMMUNITY): Payer: 59

## 2019-09-11 ENCOUNTER — Encounter (HOSPITAL_COMMUNITY): Payer: Self-pay | Admitting: Emergency Medicine

## 2019-09-11 ENCOUNTER — Emergency Department (HOSPITAL_COMMUNITY)
Admission: EM | Admit: 2019-09-11 | Discharge: 2019-09-11 | Disposition: A | Discharge status: Home / Self Care | Payer: 59 | Attending: Emergency Medicine | Admitting: Emergency Medicine

## 2019-09-11 ENCOUNTER — Other Ambulatory Visit: Payer: Self-pay

## 2019-09-11 DIAGNOSIS — M25571 Pain in right ankle and joints of right foot: Secondary | ICD-10-CM | POA: Diagnosis not present

## 2019-09-11 DIAGNOSIS — Y999 Unspecified external cause status: Secondary | ICD-10-CM | POA: Diagnosis not present

## 2019-09-11 DIAGNOSIS — W010XXA Fall on same level from slipping, tripping and stumbling without subsequent striking against object, initial encounter: Secondary | ICD-10-CM | POA: Diagnosis not present

## 2019-09-11 DIAGNOSIS — Z87891 Personal history of nicotine dependence: Secondary | ICD-10-CM | POA: Insufficient documentation

## 2019-09-11 DIAGNOSIS — Z79899 Other long term (current) drug therapy: Secondary | ICD-10-CM | POA: Diagnosis not present

## 2019-09-11 DIAGNOSIS — Y92008 Other place in unspecified non-institutional (private) residence as the place of occurrence of the external cause: Secondary | ICD-10-CM | POA: Diagnosis not present

## 2019-09-11 DIAGNOSIS — S93401A Sprain of unspecified ligament of right ankle, initial encounter: Secondary | ICD-10-CM | POA: Diagnosis not present

## 2019-09-11 DIAGNOSIS — S99911A Unspecified injury of right ankle, initial encounter: Secondary | ICD-10-CM | POA: Diagnosis present

## 2019-09-11 DIAGNOSIS — Y9301 Activity, walking, marching and hiking: Secondary | ICD-10-CM | POA: Insufficient documentation

## 2019-09-11 DIAGNOSIS — W19XXXA Unspecified fall, initial encounter: Secondary | ICD-10-CM

## 2019-09-11 DIAGNOSIS — E039 Hypothyroidism, unspecified: Secondary | ICD-10-CM | POA: Diagnosis not present

## 2019-09-11 MED ORDER — IBUPROFEN 400 MG PO TABS
600.0000 mg | ORAL_TABLET | Freq: Once | ORAL | Status: AC
  Administered 2019-09-11: 18:00:00 600 mg via ORAL
  Filled 2019-09-11: qty 1

## 2019-09-11 MED ORDER — ACETAMINOPHEN 325 MG PO TABS
650.0000 mg | ORAL_TABLET | Freq: Once | ORAL | Status: AC
  Administered 2019-09-11: 18:00:00 650 mg via ORAL
  Filled 2019-09-11: qty 2

## 2019-09-11 NOTE — Discharge Instructions (Signed)
You have an ankle sprain, your x-ray shows no evidence of fractures.  Use ibuprofen, Tylenol, ice and elevation.  Stay off of the ankle for the next few days and then you can weight-bear as tolerated.  If your symptoms are not improving over the next week please follow-up with Dr. Veverly Fells with orthopedics.

## 2019-09-11 NOTE — ED Triage Notes (Signed)
Pt reports falling in garage today. Endorses left knee and right ankle pain. Pain has improved in knee but not able to bear weight on right foot.

## 2019-09-11 NOTE — ED Provider Notes (Addendum)
[facility] EMERGENCY DEPARTMENT Provider Note   CSN: [id number] Arrival date & time: 09/11/19  1513     History Chief Complaint  Patient presents with  . Fall  . Ankle Injury    Courtney Leonard is a 54 y.o. female.  Courtney Leonard is a 54 y.o. female with a history of osteoarthritis, rheumatoid arthritis, hypothyroidism, and anxiety, who presents to the ED for evaluation of right ankle pain.  Patient states that she was walking in the garage this morning before work when her foot hit the edge of a fan causing her to roll her ankle she fell down landing on the left knee and initially the left knee was painful but that has completely resolved throughout the day she has been walking on her ankle and it has become increasingly painful she has noticed a small amount of swelling.  Patient works as a Loss adjuster, chartered in inpatient rehab.  She denies any history of injury or fracture to the ankle.  No numbness tingling or weakness.  No other aggravating or alleviating factors.        Past Medical History:  Diagnosis Date  . Anal fissure   . Anxiety   . Arthritis   . Depression   . Hypothyroidism   . Insomnia   . Migraine   . Osteoarthritis   . Rheumatoid arthritis ([place])   . Thyroid disease     Patient Active Problem List   Diagnosis Date Noted  . Rheumatoid arthritis involving multiple sites with positive rheumatoid factor ([place]) 06/13/2016  . High risk medication use 06/13/2016  . Primary osteoarthritis of both feet 06/13/2016  . Insomnia 06/13/2016  . Acquired hypothyroidism 06/13/2016  . Migraine without status migrainosus, not intractable 06/13/2016  . Former smoker 06/13/2016  . Anxiety 01/08/2011    Past Surgical History:  Procedure Laterality Date  . ABDOMINAL HYSTERECTOMY    . CESAREAN SECTION    . HEMORRHOID SURGERY    . KNEE ARTHROPLASTY    . KNEE SURGERY     RT knee  . laproscopy    . ROBOTIC ASSISTED SALPINGO OOPHERECTOMY Left  09/12/2014   Procedure: ROBOTIC ASSISTED Left SALPINGO OOPHORECTOMY/Right Salpingectomy/Pelvic Washings;  Surgeon: Princess Bruins, MD;  Location: [facility];  Service: Gynecology;  Laterality: Left;     OB History   No obstetric history on file.     Family History  Problem Relation Age of Onset  . Diabetes Mother   . Hypertension Mother   . Atrial fibrillation Father   . Prostate cancer Father     Social History   Tobacco Use  . Smoking status: Former Smoker    Packs/day: 0.10    Years: 5.00    Pack years: 0.50    Types: Cigarettes    Quit date: 06/28/2001    Years since quitting: 18.2  . Smokeless tobacco: Never Used  Substance Use Topics  . Alcohol use: No  . Drug use: No    Home Medications Prior to Admission medications   Medication Sig Start Date End Date Taking? Authorizing Provider  Adalimumab (HUMIRA PEN) 40 MG/0.4ML PNKT Inject 40 mg into the skin every 21 ( twenty-one) days. 09/05/19 10/17/19  Tresa Garter, MD  ALPRAZolam Duanne Moron) 0.5 MG tablet Take 0.5 mg by mouth as needed.  04/10/16   [provider]  amitriptyline (ELAVIL) 50 MG tablet Take 50 mg by mouth. 01/16/16   [provider]  diclofenac sodium (VOLTAREN) 1 % GEL Apply a  pea sized amount to right elbow up to 4 times daily as needed for right elbow pain. 04/26/18   [provider]  folic acid (FOLVITE) 1 MG tablet Take 2 tablets (2 mg total) by mouth daily. 02/14/19   Bo Merino, MD  levothyroxine (SYNTHROID, LEVOTHROID) 75 MCG tablet Take 75 mcg by mouth daily before breakfast.    [provider]  levothyroxine (SYNTHROID, LEVOTHROID) 88 MCG tablet Take by mouth. 08/26/18   [provider]  methotrexate 2.5 MG tablet TAKE 8 TABLETS BY MOUTH ONCE A WEEK 08/31/19   Bo Merino, MD  OnabotulinumtoxinA (BOTOX IJ) Inject as directed every 3 (three) months.    [provider]  ondansetron (ZOFRAN ODT) 4 MG disintegrating tablet Take 1 tablet (4  mg total) by mouth every 8 (eight) hours as needed. Patient not taking: Reported on 02/14/2019 07/22/18   Noe Gens, PA-C  oseltamivir (TAMIFLU) 75 MG capsule Take 1 capsule (75 mg total) by mouth every 12 (twelve) hours. Patient not taking: Reported on 09/13/2018 07/22/18   Noe Gens, PA-C  SUMAtriptan (IMITREX) 50 MG tablet Take 50 mg by mouth every 2 (two) hours as needed for migraine or headache. May repeat in 2 hours if headache persists or recurs.    [provider]  topiramate (TOPAMAX) 100 MG tablet Take 100 mg by mouth 2 (two) times daily.    [provider]  traZODone (DESYREL) 50 MG tablet Take 50 mg by mouth. 11/21/15   [provider]  Tuberculin-Allergy Syringes 27G X 1/2" 1 ML MISC Patient to use to inject Methotrexate once weekly. Patient not taking: Reported on 09/13/2018 03/02/17   Bo Merino, MD  venlafaxine XR (EFFEXOR-XR) 150 MG 24 hr capsule Take 150 mg by mouth. 04/10/16   [provider]    Allergies    Sulfur  Review of Systems   Review of Systems  Constitutional: Negative for chills and fever.  Musculoskeletal: Positive for arthralgias and joint swelling.  Skin: Negative for color change and rash.  Neurological: Negative for weakness and numbness.    Physical Exam Updated Vital Signs BP 107/73 (BP Location: Left Arm)   Pulse 87   Temp 98.3 F (36.8 C) (Oral)   Resp 16   SpO2 98%   Physical Exam Vitals and nursing note reviewed.  Constitutional:      General: She is not in acute distress.    Appearance: She is well-developed. She is not diaphoretic.  HENT:     Head: Normocephalic and atraumatic.  Eyes:     General:        Right eye: No discharge.        Left eye: No discharge.  Pulmonary:     Effort: Pulmonary effort is normal. No respiratory distress.  Musculoskeletal:     Comments: There is mild swelling and tenderness over the lateral malleolus.No overt deformity. No tenderness over the medial  aspect of the ankle. The fifth metatarsal is not tender. The ankle joint is intact without excessive opening on stressing.  2+ DP and PT pulses.  Skin:    General: Skin is warm and dry.  Neurological:     Mental Status: She is alert and oriented to person, place, and time.     Coordination: Coordination normal.  Psychiatric:        Mood and Affect: Mood normal.        Behavior: Behavior normal.     ED Results / Procedures / Treatments  Labs (all labs ordered are listed, but only abnormal results are displayed) Labs Reviewed - No data to display  EKG None  Radiology DG Ankle Complete Right  Result Date: 09/11/2019 CLINICAL DATA:  Trauma to right ankle, pain EXAM: RIGHT ANKLE - COMPLETE 3+ VIEW COMPARISON:  09/13/2018 FINDINGS: Frontal, oblique, and lateral views of the right ankle are obtained. No acute fracture, subluxation, or dislocation. Joint spaces are well preserved. Stable degenerative or chronic posttraumatic changes dorsal aspect tarsal navicular. Soft tissues are normal. Small inferior calcaneal spur is stable. IMPRESSION: 1. Unremarkable right ankle. Electronically Signed   By: Randa Ngo M.D.   On: 09/11/2019 16:11    Procedures Procedures (including critical care time)  Medications Ordered in ED Medications  ibuprofen (ADVIL) tablet 600 mg (600 mg Oral Given 09/11/19 1751)  acetaminophen (TYLENOL) tablet 650 mg (650 mg Oral Given 09/11/19 1751)    ED Course  I have reviewed the triage vital signs and the nursing notes.  Pertinent labs & imaging results that were available during my care of the patient were reviewed by me and considered in my medical decision making (see chart for details).    MDM Rules/Calculators/A&P                     Presentation consistent with ankle sprain. Tenderness and swelling over lateral malleolus, pt is neurovascularly intact, and x-ray negative for fracture, and shows ankle mortise is intact. Pain treated in the ED. Pt placed in  ASO brace and provided crutches, ambulated without difficulty. Pt stable for discharge home with ibuprofen for pain. Pt to follow-up with ortho in one week if symptoms not improving. Return precautions discussed, Pt expresses understanding and agrees with plan.  Final Clinical Impression(s) / ED Diagnoses Final diagnoses:  Sprain of right ankle, unspecified ligament, initial encounter    Rx / DC Orders ED Discharge Orders    None       Janet Berlin 09/11/19 1857    Lajean Saver, MD 09/11/19 Einar Crow    Lajean Saver, MD 09/11/19 [phone]

## 2019-10-09 MED FILL — HUMIRA PEN 40 MG/0.4ML PNKT: 40 | 42 days supply | Qty: 2 | Fill #0

## 2019-10-23 MED FILL — AMITRIPTYLINE HCL 25 MG TAB: 25 | 30 days supply | Qty: 30 | Fill #2

## 2019-11-10 ENCOUNTER — Other Ambulatory Visit: Payer: Self-pay

## 2019-11-10 DIAGNOSIS — Z79899 Other long term (current) drug therapy: Secondary | ICD-10-CM

## 2019-11-11 LAB — CBC WITH DIFFERENTIAL/PLATELET
Absolute Monocytes: 353 cells/uL (ref 200–950)
Basophils Absolute: 39 cells/uL (ref 0–200)
Basophils Relative: 0.8 %
Eosinophils Absolute: 59 cells/uL (ref 15–500)
Eosinophils Relative: 1.2 %
HCT: 40.1 % (ref 35.0–45.0)
Hemoglobin: 13.5 g/dL (ref 11.7–15.5)
Lymphs Abs: 1441 cells/uL (ref 850–3900)
MCH: 33.5 pg — ABNORMAL HIGH (ref 27.0–33.0)
MCHC: 33.7 g/dL (ref 32.0–36.0)
MCV: 99.5 fL (ref 80.0–100.0)
MPV: 10.4 fL (ref 7.5–12.5)
Monocytes Relative: 7.2 %
Neutro Abs: 3009 cells/uL (ref 1500–7800)
Neutrophils Relative %: 61.4 %
Platelets: 231 10*3/uL (ref 140–400)
RBC: 4.03 10*6/uL (ref 3.80–5.10)
RDW: 13.4 % (ref 11.0–15.0)
Total Lymphocyte: 29.4 %
WBC: 4.9 10*3/uL (ref 3.8–10.8)

## 2019-11-11 LAB — COMPLETE METABOLIC PANEL WITH GFR
AG Ratio: 1.8 (calc) (ref 1.0–2.5)
ALT: 12 U/L (ref 6–29)
AST: 13 U/L (ref 10–35)
Albumin: 4.3 g/dL (ref 3.6–5.1)
Alkaline phosphatase (APISO): 76 U/L (ref 37–153)
BUN: 19 mg/dL (ref 7–25)
CO2: 21 mmol/L (ref 20–32)
Calcium: 9 mg/dL (ref 8.6–10.4)
Chloride: 108 mmol/L (ref 98–110)
Creat: 1 mg/dL (ref 0.50–1.05)
GFR, Est African American: 75 mL/min/{1.73_m2} (ref >=60)
GFR, Est Non African American: 65 mL/min/{1.73_m2} (ref >=60)
Globulin: 2.4 g/dL (calc) (ref 1.9–3.7)
Glucose, Bld: 81 mg/dL (ref 65–99)
Potassium: 3.9 mmol/L (ref 3.5–5.3)
Sodium: 137 mmol/L (ref 135–146)
Total Bilirubin: 0.3 mg/dL (ref 0.2–1.2)
Total Protein: 6.7 g/dL (ref 6.1–8.1)

## 2019-11-13 NOTE — Progress Notes (Signed)
CMP and CBC WNL

## 2019-11-16 ENCOUNTER — Other Ambulatory Visit: Payer: Self-pay | Admitting: Rheumatology

## 2019-11-16 NOTE — Telephone Encounter (Signed)
Last Visit: 07/19/2019 telemedicine  Next Visit: message sent to the front desk to schedule, due 11/2019. Labs: 11/10/2019 CMP and CBC WNL.  Okay to refill per Dr. Estanislado Pandy.

## 2019-11-21 MED FILL — VENLAFAXINE HCL ER 75 MG CA: 75 | 30 days supply | Qty: 90 | Fill #0

## 2019-11-21 MED FILL — FOLIC ACID 1 MG TABS: 1 | 90 days supply | Qty: 180 | Fill #0

## 2019-11-21 MED FILL — HUMIRA PEN 40 MG/0.4ML PNKT: 40 | 42 days supply | Qty: 2 | Fill #1

## 2019-11-21 MED FILL — METHOTREXATE 2.5 MG TAB: 2.5 | 84 days supply | Qty: 96 | Fill #0

## 2019-11-21 MED FILL — AMITRIPTYLINE HCL 25 MG TAB: 25 | 30 days supply | Qty: 30 | Fill #0

## 2019-11-21 MED FILL — TOPIRAMATE 100 MG TABLET: 100 | 90 days supply | Qty: 180 | Fill #0

## 2019-11-30 NOTE — Progress Notes (Signed)
Office Visit Note  Patient: Courtney Leonard             Date of Birth: 03-Jun-1966           MRN: [id number]             PCP: Deborah Chalk, FNP Referring: Deborah Chalk, FNP Visit Date: 12/05/2019 Occupation: @GUAROCC @  Subjective:  Medication monitoring   History of Present Illness: Courtney Leonard is a 54 y.o. female with history of seropositive rheumatoid arthritis and osteoarthritis.  She is taking Humira 40 mg sq injections every 21 days, MTX 8 tablets po once weekly, and folic acid 2 mg po daily.  She denies missing any doses recently.  She denies any recent infections.  She has received both covid-19 vaccines.  She denies any recent rheumatoid arthritis flares.  She has occasional pain in both feet.  She has intermittent pain in the right great toe. She denies any joint swelling.  She wears compression stockings at work.  She denies any other new concerns.    Activities of Daily Living:  Patient reports morning stiffness for 1 hour.   Patient Denies nocturnal pain.  Difficulty dressing/grooming: Denies Difficulty climbing stairs: Denies Difficulty getting out of chair: Denies Difficulty using hands for taps, buttons, cutlery, and/or writing: Denies  Review of Systems  Constitutional: Positive for fatigue.  HENT: Positive for mouth dryness. Negative for mouth sores and nose dryness.   Eyes: Positive for dryness. Negative for pain and visual disturbance.  Respiratory: Negative for cough, hemoptysis, shortness of breath and difficulty breathing.   Cardiovascular: Negative for chest pain, palpitations, hypertension and swelling in legs/feet.  Gastrointestinal: Negative for blood in stool, constipation and diarrhea.  Endocrine: Negative for excessive thirst and increased urination.  Genitourinary: Negative for difficulty urinating and painful urination.  Musculoskeletal: Positive for morning stiffness. Negative for arthralgias, joint pain, joint swelling, myalgias,  muscle weakness, muscle tenderness and myalgias.  Skin: Negative for color change, pallor, rash, hair loss, nodules/bumps, skin tightness, ulcers and sensitivity to sunlight.  Allergic/Immunologic: Negative for susceptible to infections.  Neurological: Negative for dizziness, numbness, headaches and weakness.  Hematological: Negative for swollen glands.  Psychiatric/Behavioral: Negative for depressed mood and sleep disturbance. The patient is not nervous/anxious.     PMFS History:  Patient Active Problem List   Diagnosis Date Noted  . Rheumatoid arthritis involving multiple sites with positive rheumatoid factor ([place]) 06/13/2016  . High risk medication use 06/13/2016  . Primary osteoarthritis of both feet 06/13/2016  . Insomnia 06/13/2016  . Acquired hypothyroidism 06/13/2016  . Migraine without status migrainosus, not intractable 06/13/2016  . Former smoker 06/13/2016  . Anxiety 01/08/2011    Past Medical History:  Diagnosis Date  . Anal fissure   . Anxiety   . Arthritis   . Depression   . Hypothyroidism   . Insomnia   . Migraine   . Osteoarthritis   . Rheumatoid arthritis ([place])   . Thyroid disease     Family History  Problem Relation Age of Onset  . Diabetes Mother   . Hypertension Mother   . Atrial fibrillation Father   . Prostate cancer Father    Past Surgical History:  Procedure Laterality Date  . ABDOMINAL HYSTERECTOMY    . CESAREAN SECTION    . HEMORRHOID SURGERY    . KNEE SURGERY     RT knee  . laproscopy    . ROBOTIC ASSISTED SALPINGO OOPHERECTOMY Left 09/12/2014   Procedure: ROBOTIC ASSISTED  Left SALPINGO OOPHORECTOMY/Right Salpingectomy/Pelvic Washings;  Surgeon: Princess Bruins, MD;  Location:  [facility];  Service: Gynecology;  Laterality: Left;   Social History   Social History Narrative  . Not on file   Immunization History  Administered Date(s) Administered  . Hepatitis B 11/08/2009, 12/11/2009  . Influenza,trivalent, recombinat, inj, PF  05/17/2006, 05/03/2013  . Influenza-Unspecified 05/21/2017, 05/01/2018  . Pneumococcal Polysaccharide-23 04/10/2016  . Td 06/26/2003  . Tdap 03/06/2010, 01/13/2016  . Varicella 12/25/2009, 01/24/2010  . Zoster Recombinat (Shingrix) 09/24/2018, 03/17/2019     Objective: Vital Signs: BP 97/71 (BP Location: Left Arm, Patient Position: Sitting, Cuff Size: Normal)   Pulse 89   Resp 14   Ht 5' 3.5" (1.613 m)   Wt 160 lb 12.8 oz (72.9 kg)   BMI 28.04 kg/m    Physical Exam Vitals and nursing note reviewed.  Constitutional:      Appearance: She is well-developed.  HENT:     Head: Normocephalic and atraumatic.  Eyes:     Conjunctiva/sclera: Conjunctivae normal.  Pulmonary:     Effort: Pulmonary effort is normal.  Abdominal:     General: Bowel sounds are normal.     Palpations: Abdomen is soft.  Musculoskeletal:     Cervical back: Normal range of motion.  Lymphadenopathy:     Cervical: No cervical adenopathy.  Skin:    General: Skin is warm and dry.     Capillary Refill: Capillary refill takes less than 2 seconds.  Neurological:     Mental Status: She is alert and oriented to person, place, and time.  Psychiatric:        Behavior: Behavior normal.      Musculoskeletal Exam: C-spine, thoracic spine, and lumbar spine good ROM.  No midline spinal tenderness.  No SI joint tenderness.  Shoulder joints, elbow joints, wrist joints, MCPs, PIPs, and DIPs good ROM with no synovitis.  Complete fist formation bilaterally.  Hip joints, knee joints, ankle joints, MTPs, PIPs, DIPs good range of motion with no synovitis.  No warmth or effusion of bilateral knee joints.  Has bilateral knee crepitus.  No tenderness or inflammation of ankle joints.  No tenderness of MTP joints on exam.  CDAI Exam: CDAI Score: 0.4  Patient Global: 2 mm; Provider Global: 2 mm Swollen: 0 ; Tender: 0  Joint Exam 12/05/2019   No joint exam has been documented for this visit   There is currently no information  documented on the homunculus. Go to the Rheumatology activity and complete the homunculus joint exam.  Investigation: No additional findings.  Imaging: No results found.  Recent Labs: Lab Results  Component Value Date   WBC 4.9 11/10/2019   HGB 13.5 11/10/2019   PLT 231 11/10/2019   NA 137 11/10/2019   K 3.9 11/10/2019   CL 108 11/10/2019   CO2 21 11/10/2019   GLUCOSE 81 11/10/2019   BUN 19 11/10/2019   CREATININE 1.00 11/10/2019   BILITOT 0.3 11/10/2019   ALKPHOS 72 03/04/2017   AST 13 11/10/2019   ALT 12 11/10/2019   PROT 6.7 11/10/2019   ALBUMIN 4.6 03/04/2017   CALCIUM 9.0 11/10/2019   GFRAA 75 11/10/2019   QFTBGOLDPLUS NEGATIVE 08/14/2019    Speciality Comments: No specialty comments available.  Procedures:  No procedures performed Allergies: Sulfur   Assessment / Plan:     Visit Diagnoses: Rheumatoid arthritis involving multiple sites with positive rheumatoid factor ([place]): She has no tenderness or synovitis on exam.  She has not had any recent  rheumatoid arthritis flares.  She is clinically doing well on Humira 40 mg subcutaneous injections every 21 days, methotrexate 8 tablets by mouth once weekly and folic acid 2 mg by mouth daily.  She has not missed any doses of Humira or methotrexate recently.  She has not had any recent infections.  She has received both COVID-19 vaccinations.  She experiences occasional discomfort in her feet due to underlying osteoarthritis.  She has no tenderness or synovitis of the MTP joints on exam.  She is not experiencing any other joint pain or inflammation at this time.  She will continue on Humira 40 mg subcutaneous injections every 21 days, methotrexate 8 tablets by mouth once weekly and folic acid 2 mg a mouth daily.  She does not need any refills at this time.  She was advised to notify us if she develops increased joint pain or joint swelling.  She will follow-up in the office in 5 months.  High risk medication use - Humira 40 mg sq  injections every 21 days, methotrexate 2.5 mg 8 tablets every 7 days, folic acid 1 mg 2 tablets daily.  CBC and CMP were within normal limits on 11/10/2019.  She will be due to update lab work in July and every 3 months.  Standing orders for CBC and CMP are in place.  TB gold was negative on 08/04/2019 and we will continue to monitor yearly.  We discussed the importance of holding methotrexate and Humira if she develops any signs or symptoms of an infection and to resume once the infection is completely cleared.  She voiced understanding.  She has received both COVID-19 vaccinations.  She has not had any recent infections.  Primary osteoarthritis of both feet: She experiences occasional discomfort in her feet which is exacerbated by wearing compression stockings at work for prolonged period of time.  She has no tenderness or synovitis of her MTP joints on exam.  She experiences occasional tenderness in the right first PIP.  We discussed the importance of wearing proper fitting shoes.  Other medical conditions are listed as follows:  History of hypothyroidism  History of migraine  Primary insomnia  Former smoker  History of anxiety  Orders: No orders of the defined types were placed in this encounter.  No orders of the defined types were placed in this encounter.     Follow-Up Instructions: Return in about 5 months (around 05/06/2020) for Rheumatoid arthritis, Osteoarthritis.   Ofilia Neas, PA-C  Note - This record has been created using Dragon software.  Chart creation errors have been sought, but may not always  have been located. Such creation errors do not reflect on  the standard of medical care.

## 2019-12-05 ENCOUNTER — Encounter: Payer: Self-pay | Admitting: Rheumatology

## 2019-12-05 ENCOUNTER — Other Ambulatory Visit: Payer: Self-pay

## 2019-12-05 ENCOUNTER — Ambulatory Visit: Payer: 59 | Admitting: Physician Assistant

## 2019-12-05 VITALS — BP 97/71 | HR 89 | Resp 14 | Ht 63.5 in | Wt 160.8 lb

## 2019-12-05 DIAGNOSIS — Z87891 Personal history of nicotine dependence: Secondary | ICD-10-CM | POA: Diagnosis not present

## 2019-12-05 DIAGNOSIS — Z8659 Personal history of other mental and behavioral disorders: Secondary | ICD-10-CM

## 2019-12-05 DIAGNOSIS — Z8669 Personal history of other diseases of the nervous system and sense organs: Secondary | ICD-10-CM | POA: Diagnosis not present

## 2019-12-05 DIAGNOSIS — Z8639 Personal history of other endocrine, nutritional and metabolic disease: Secondary | ICD-10-CM | POA: Diagnosis not present

## 2019-12-05 DIAGNOSIS — Z79899 Other long term (current) drug therapy: Secondary | ICD-10-CM | POA: Diagnosis not present

## 2019-12-05 DIAGNOSIS — M19072 Primary osteoarthritis, left ankle and foot: Secondary | ICD-10-CM | POA: Diagnosis not present

## 2019-12-05 DIAGNOSIS — M0579 Rheumatoid arthritis with rheumatoid factor of multiple sites without organ or systems involvement: Secondary | ICD-10-CM

## 2019-12-05 DIAGNOSIS — M19071 Primary osteoarthritis, right ankle and foot: Secondary | ICD-10-CM

## 2019-12-05 DIAGNOSIS — F5101 Primary insomnia: Secondary | ICD-10-CM

## 2019-12-05 NOTE — Patient Instructions (Signed)
Standing Labs We placed an order today for your standing lab work.    Please come back and get your standing labs in July and every 3 months  We have open lab daily Monday through Thursday from 8:30-12:30 PM and 1:30-4:30 PM and Friday from 8:30-12:30 PM and 1:30-4:00 PM at the office of Dr. Shaili Deveshwar.   You may experience shorter wait times on Monday and Friday afternoons. The office is located at [address], [address], [place], [place] [address] No appointment is necessary.   Labs are drawn by [facility].  You may receive a bill from [facility] for your lab work.  If you wish to have your labs drawn at another location, please call the office 24 hours in advance to send orders.  If you have any questions regarding directions or hours of operation,  please call [phone].   Just as a reminder please drink plenty of water prior to coming for your lab work. Thanks!  

## 2020-01-02 ENCOUNTER — Other Ambulatory Visit: Payer: Self-pay | Admitting: Rheumatology

## 2020-01-02 ENCOUNTER — Other Ambulatory Visit: Payer: Self-pay | Admitting: Pharmacist

## 2020-01-02 DIAGNOSIS — M0579 Rheumatoid arthritis with rheumatoid factor of multiple sites without organ or systems involvement: Secondary | ICD-10-CM

## 2020-01-02 MED ORDER — HUMIRA (2 PEN) 40 MG/0.4ML ~~LOC~~ AJKT: 40.0000 mg | AUTO-INJECTOR | SUBCUTANEOUS | 1 refills | Status: DC

## 2020-01-02 NOTE — Telephone Encounter (Signed)
Last Visit: 12/05/2019 Next Visit: 05/07/2020 Labs: 11/10/2019 CMP and CBC WNL. TB Gold: 08/04/2019 Neg   Current Dose per office note 12/05/2019: Humira 40 mg subcutaneous injections every 21 days  Okay to refill per Dr. Estanislado Pandy

## 2020-01-25 DIAGNOSIS — Z8249 Family history of ischemic heart disease and other diseases of the circulatory system: Secondary | ICD-10-CM | POA: Diagnosis not present

## 2020-01-25 DIAGNOSIS — N951 Menopausal and female climacteric states: Secondary | ICD-10-CM | POA: Diagnosis not present

## 2020-01-25 DIAGNOSIS — Z0001 Encounter for general adult medical examination with abnormal findings: Secondary | ICD-10-CM | POA: Diagnosis not present

## 2020-01-25 DIAGNOSIS — R809 Proteinuria, unspecified: Secondary | ICD-10-CM | POA: Diagnosis not present

## 2020-01-25 DIAGNOSIS — E039 Hypothyroidism, unspecified: Secondary | ICD-10-CM | POA: Diagnosis not present

## 2020-01-25 DIAGNOSIS — F5109 Other insomnia not due to a substance or known physiological condition: Secondary | ICD-10-CM | POA: Diagnosis not present

## 2020-01-25 DIAGNOSIS — F329 Major depressive disorder, single episode, unspecified: Secondary | ICD-10-CM | POA: Diagnosis not present

## 2020-01-25 DIAGNOSIS — Z79899 Other long term (current) drug therapy: Secondary | ICD-10-CM | POA: Diagnosis not present

## 2020-01-25 DIAGNOSIS — F419 Anxiety disorder, unspecified: Secondary | ICD-10-CM | POA: Diagnosis not present

## 2020-01-26 ENCOUNTER — Other Ambulatory Visit (HOSPITAL_BASED_OUTPATIENT_CLINIC_OR_DEPARTMENT_OTHER): Payer: Self-pay | Admitting: Physician Assistant

## 2020-01-26 DIAGNOSIS — N951 Menopausal and female climacteric states: Secondary | ICD-10-CM | POA: Diagnosis not present

## 2020-01-26 DIAGNOSIS — Z0000 Encounter for general adult medical examination without abnormal findings: Secondary | ICD-10-CM | POA: Diagnosis not present

## 2020-01-26 DIAGNOSIS — Z79899 Other long term (current) drug therapy: Secondary | ICD-10-CM | POA: Diagnosis not present

## 2020-01-26 DIAGNOSIS — F419 Anxiety disorder, unspecified: Secondary | ICD-10-CM | POA: Diagnosis not present

## 2020-01-26 DIAGNOSIS — E039 Hypothyroidism, unspecified: Secondary | ICD-10-CM | POA: Diagnosis not present

## 2020-01-26 DIAGNOSIS — R809 Proteinuria, unspecified: Secondary | ICD-10-CM | POA: Diagnosis not present

## 2020-01-26 DIAGNOSIS — F5109 Other insomnia not due to a substance or known physiological condition: Secondary | ICD-10-CM | POA: Diagnosis not present

## 2020-01-26 MED FILL — ALPRAZolam 0.5 MG TABS: 0.5 | 30 days supply | Qty: 30 | Fill #0

## 2020-01-26 MED FILL — VENLAFAXINE HCL ER 75 MG CA: 75 | 30 days supply | Qty: 90 | Fill #0

## 2020-02-15 DIAGNOSIS — Z1231 Encounter for screening mammogram for malignant neoplasm of breast: Secondary | ICD-10-CM | POA: Diagnosis not present

## 2020-02-16 IMAGING — DX DG ANKLE COMPLETE 3+V*R*
3 series · 3 of 3 positions shown · non-contrast
Comparison: 09/13/2018

CLINICAL DATA: Trauma to right ankle, pain

EXAM:
RIGHT ANKLE - COMPLETE 3+ VIEW

[ankle ap]
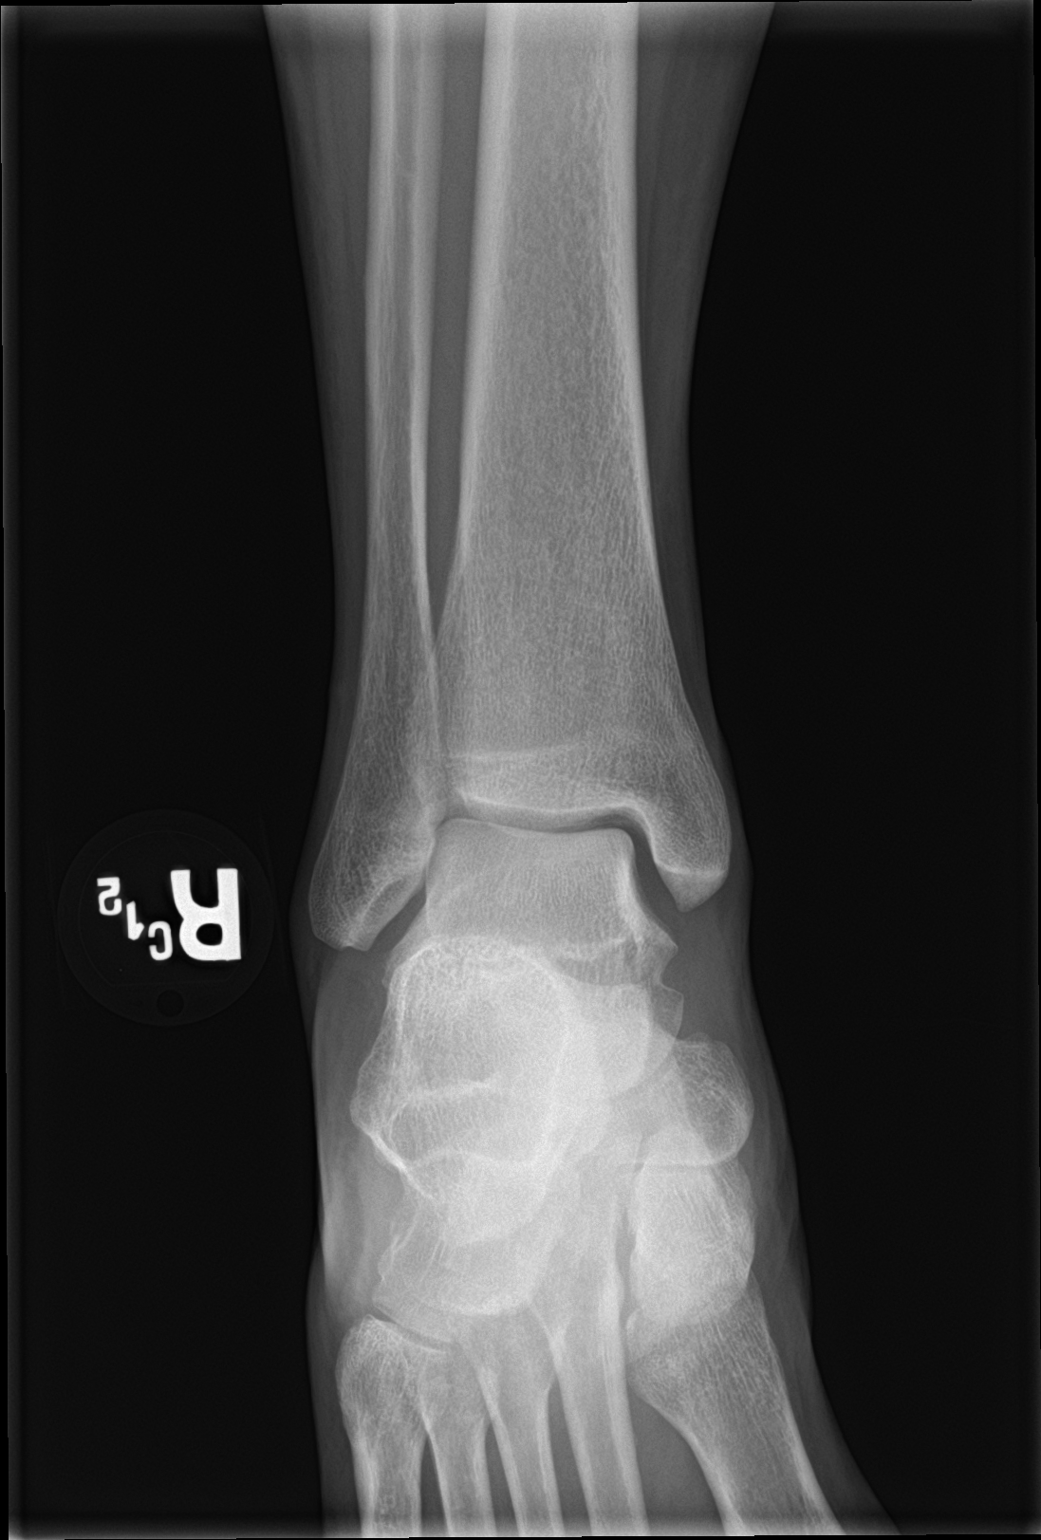

[ankle obl]
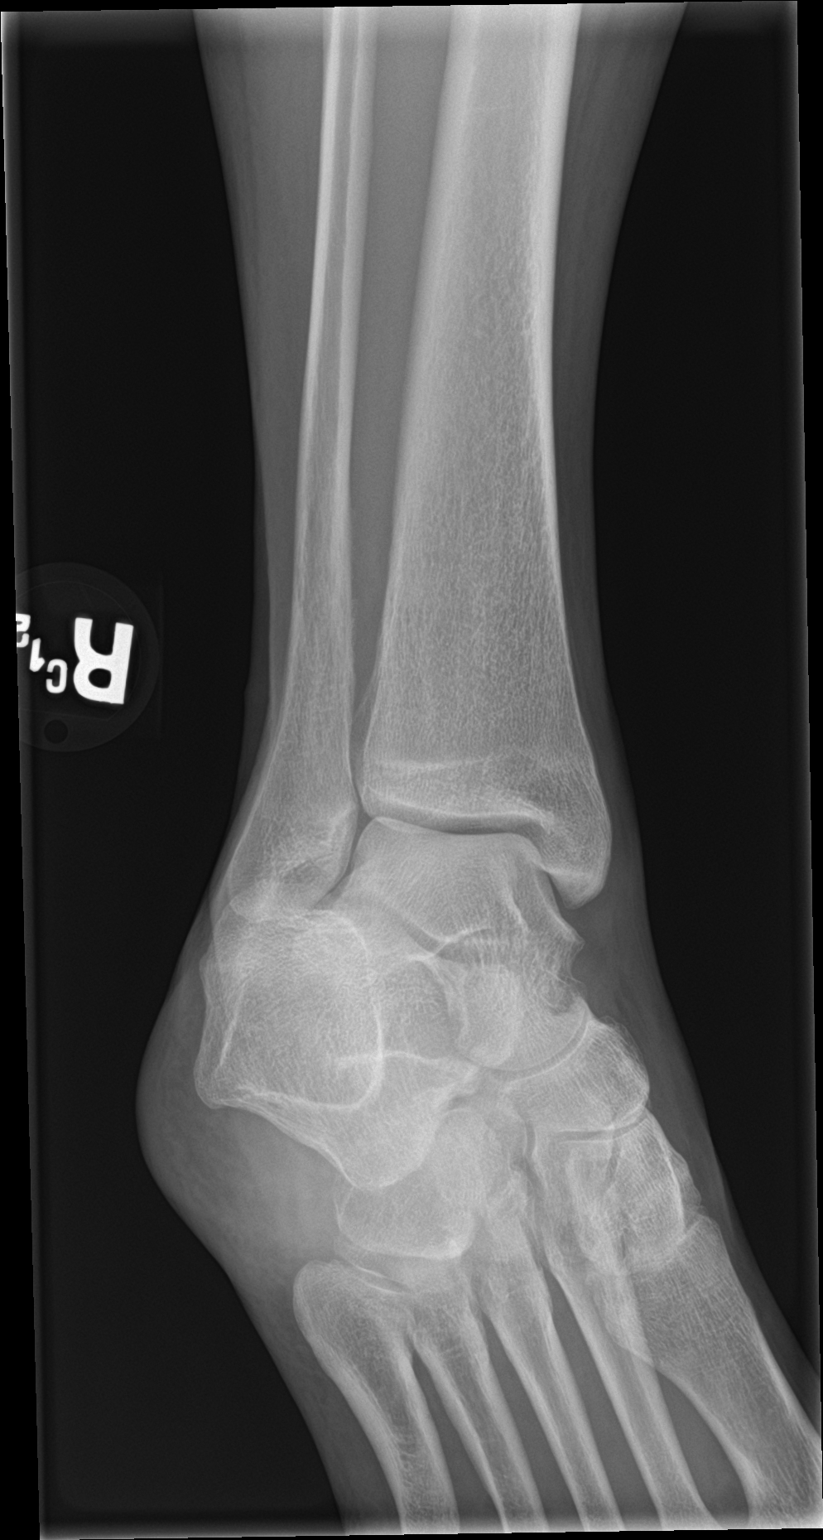

[ankle lat]
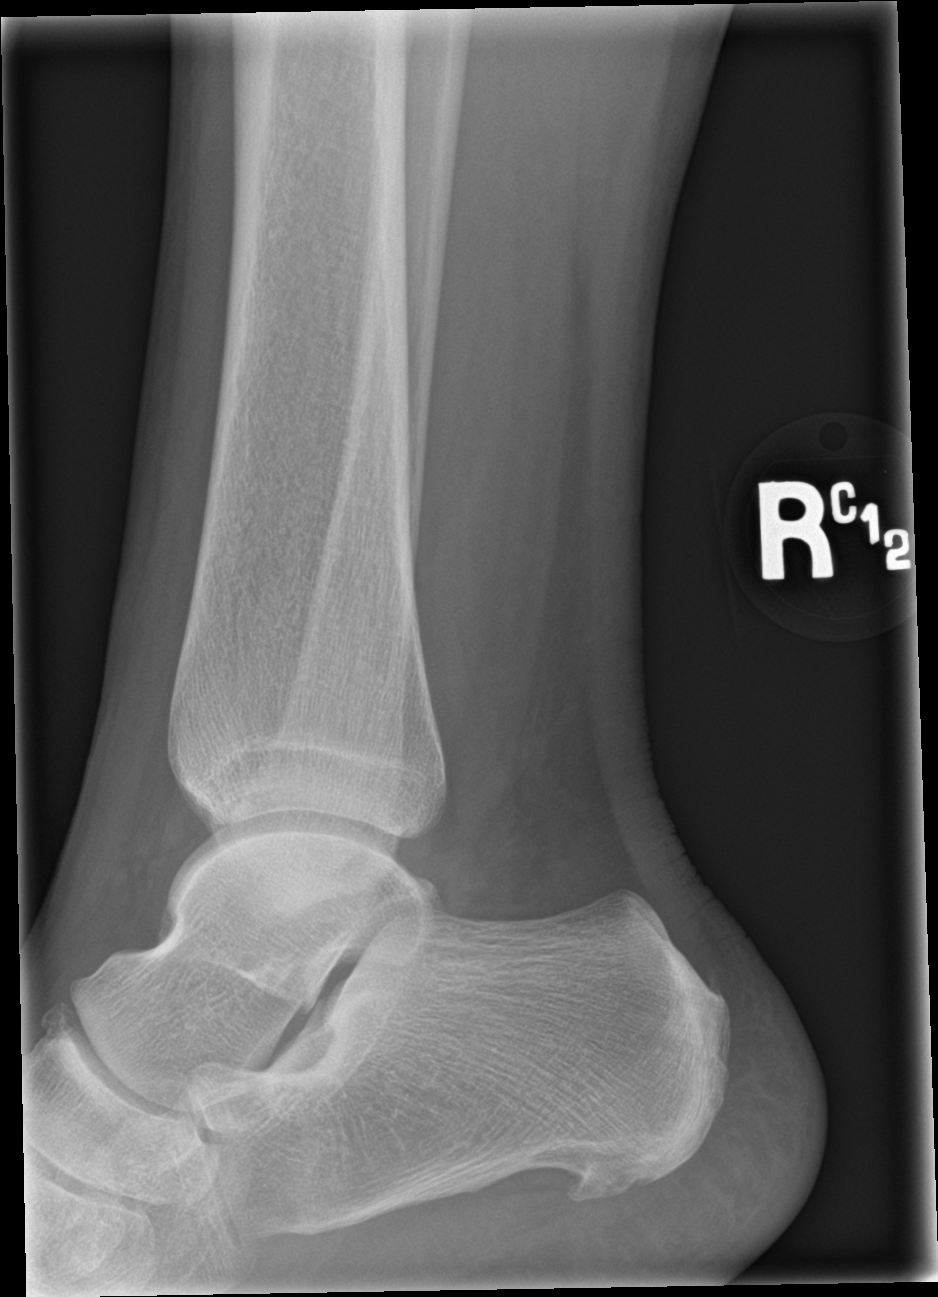

[3 of 3 positions shown; findings below may reference images not displayed]

FINDINGS: Frontal, oblique, and lateral views of the right ankle are obtained.
No acute fracture, subluxation, or dislocation. Joint spaces are
well preserved. Stable degenerative or chronic posttraumatic changes
dorsal aspect tarsal navicular. Soft tissues are normal. Small
inferior calcaneal spur is stable.
IMPRESSION: 1. Unremarkable right ankle.

## 2020-02-19 MED FILL — HUMIRA PEN 40 MG/0.4ML PNKT: 40 | 42 days supply | Qty: 2 | Fill #1

## 2020-02-22 ENCOUNTER — Other Ambulatory Visit: Payer: Self-pay | Admitting: Rheumatology

## 2020-02-22 ENCOUNTER — Other Ambulatory Visit (HOSPITAL_BASED_OUTPATIENT_CLINIC_OR_DEPARTMENT_OTHER): Payer: Self-pay | Admitting: Physician Assistant

## 2020-02-22 MED FILL — VENLAFAXINE HCL ER 75 MG CA: 75 | 90 days supply | Qty: 270 | Fill #0

## 2020-02-22 MED FILL — LEVOTHYROXINE SODIUM 88 MCG: 88 | 90 days supply | Qty: 90 | Fill #0

## 2020-02-22 MED FILL — AMITRIPTYLINE HCL 25 MG TAB: 25 | 30 days supply | Qty: 30 | Fill #1

## 2020-02-22 MED FILL — METHOTREXATE 2.5 MG TAB: 2.5 | 28 days supply | Qty: 32 | Fill #0

## 2020-02-22 NOTE — Telephone Encounter (Signed)
Last Visit: 12/05/2019 Next Visit: 05/07/2020 Labs: 11/10/2019 CMP and CBC WNL.  Current Dose per office note on 12/05/2019: methotrexate 8 tablets by mouth once weekly   Left message to advise patient she is due to update labs.   Okay to refill 30 day supply MTX?

## 2020-02-23 ENCOUNTER — Other Ambulatory Visit: Payer: Self-pay

## 2020-02-23 DIAGNOSIS — Z79899 Other long term (current) drug therapy: Secondary | ICD-10-CM | POA: Diagnosis not present

## 2020-02-24 LAB — COMPLETE METABOLIC PANEL WITH GFR
AG Ratio: 1.7 (calc) (ref 1.0–2.5)
ALT: 15 U/L (ref 6–29)
AST: 16 U/L (ref 10–35)
Albumin: 4.2 g/dL (ref 3.6–5.1)
Alkaline phosphatase (APISO): 78 U/L (ref 37–153)
BUN: 20 mg/dL (ref 7–25)
CO2: 24 mmol/L (ref 20–32)
Calcium: 9.1 mg/dL (ref 8.6–10.4)
Chloride: 107 mmol/L (ref 98–110)
Creat: 1.02 mg/dL (ref 0.50–1.05)
GFR, Est African American: 73 mL/min/{1.73_m2} (ref >=60)
GFR, Est Non African American: 63 mL/min/{1.73_m2} (ref >=60)
Globulin: 2.5 g/dL (calc) (ref 1.9–3.7)
Glucose, Bld: 82 mg/dL (ref 65–99)
Potassium: 3.8 mmol/L (ref 3.5–5.3)
Sodium: 139 mmol/L (ref 135–146)
Total Bilirubin: 0.4 mg/dL (ref 0.2–1.2)
Total Protein: 6.7 g/dL (ref 6.1–8.1)

## 2020-02-24 LAB — CBC WITH DIFFERENTIAL/PLATELET
Absolute Monocytes: 368 cells/uL (ref 200–950)
Basophils Absolute: 51 cells/uL (ref 0–200)
Basophils Relative: 1.1 %
Eosinophils Absolute: 69 cells/uL (ref 15–500)
Eosinophils Relative: 1.5 %
HCT: 39 % (ref 35.0–45.0)
Hemoglobin: 13.1 g/dL (ref 11.7–15.5)
Lymphs Abs: 1412 cells/uL (ref 850–3900)
MCH: 33.6 pg — ABNORMAL HIGH (ref 27.0–33.0)
MCHC: 33.6 g/dL (ref 32.0–36.0)
MCV: 100 fL (ref 80.0–100.0)
MPV: 9.9 fL (ref 7.5–12.5)
Monocytes Relative: 8 %
Neutro Abs: 2700 cells/uL (ref 1500–7800)
Neutrophils Relative %: 58.7 %
Platelets: 216 10*3/uL (ref 140–400)
RBC: 3.9 10*6/uL (ref 3.80–5.10)
RDW: 12.7 % (ref 11.0–15.0)
Total Lymphocyte: 30.7 %
WBC: 4.6 10*3/uL (ref 3.8–10.8)

## 2020-02-26 NOTE — Progress Notes (Signed)
CMP WNL.  MCH is borderline elevated.  Please clarify if the patient has been taking folic acid 1 mg 2 tablets daily.  Rest of CBC is WNL.

## 2020-03-12 ENCOUNTER — Other Ambulatory Visit: Payer: Self-pay | Admitting: Rheumatology

## 2020-03-12 ENCOUNTER — Other Ambulatory Visit (HOSPITAL_COMMUNITY): Payer: Self-pay | Admitting: Internal Medicine

## 2020-03-12 ENCOUNTER — Other Ambulatory Visit: Payer: Self-pay | Admitting: Pharmacist

## 2020-03-12 DIAGNOSIS — M0579 Rheumatoid arthritis with rheumatoid factor of multiple sites without organ or systems involvement: Secondary | ICD-10-CM

## 2020-03-12 MED ORDER — HUMIRA (2 PEN) 40 MG/0.4ML ~~LOC~~ AJKT: 40.0000 mg | AUTO-INJECTOR | SUBCUTANEOUS | 1 refills | Status: DC

## 2020-03-12 NOTE — Telephone Encounter (Signed)
Last Visit:12/05/2019 Next Visit:05/07/2020 Labs:02/23/2020 CMP WNL. MCH is borderline elevated. Rest of CBC is WNL.  TB Gold: 08/14/2019 Neg   Current Dose per office note on 12/05/2019:  Humira 40 mg subcutaneous injections every 21 days  Okay to refill Humira?

## 2020-03-20 MED FILL — LORazepam 2 MG TABS: 2 | 1 days supply | Qty: 1 | Fill #0

## 2020-03-21 ENCOUNTER — Other Ambulatory Visit: Payer: Self-pay | Admitting: Physician Assistant

## 2020-03-21 MED FILL — TOPIRAMATE 100 MG TABLET: 100 | 90 days supply | Qty: 180 | Fill #0

## 2020-03-22 MED FILL — METHOTREXATE 2.5 MG TAB: 2.5 | 84 days supply | Qty: 96 | Fill #0

## 2020-03-22 NOTE — Telephone Encounter (Signed)
Last Visit:12/05/2019 Next Visit:05/07/2020 Labs: 02/23/2020 CMP WNL. MCH is borderline elevated. Rest of CBC is WNL.   Current Dose per office note on 12/05/2019: methotrexate 8 tablets by mouth once weekly   Okay to refill MTX?

## 2020-04-01 MED FILL — HUMIRA PEN 40 MG/0.4ML PNKT: 40 | 42 days supply | Qty: 2 | Fill #0

## 2020-04-02 ENCOUNTER — Other Ambulatory Visit (HOSPITAL_BASED_OUTPATIENT_CLINIC_OR_DEPARTMENT_OTHER): Payer: Self-pay | Admitting: Neurology

## 2020-04-02 DIAGNOSIS — G43709 Chronic migraine without aura, not intractable, without status migrainosus: Secondary | ICD-10-CM | POA: Diagnosis not present

## 2020-04-03 MED FILL — AMITRIPTYLINE HCL 25 MG TAB: 25 | 30 days supply | Qty: 30 | Fill #0

## 2020-04-23 NOTE — Progress Notes (Deleted)
Office Visit Note  Patient: Courtney Leonard             Date of Birth: 14-Aug-1965           MRN: [id number]             PCP: Deborah Chalk, FNP Referring: Deborah Chalk, FNP Visit Date: 05/07/2020 Occupation: @GUAROCC @  Subjective:  No chief complaint on file.   History of Present Illness: Courtney Leonard is a 54 y.o. female ***   Activities of Daily Living:  Patient reports morning stiffness for *** {minute/hour:19697}.   Patient {ACTIONS;DENIES/REPORTS:21021675::"Denies"} nocturnal pain.  Difficulty dressing/grooming: {ACTIONS;DENIES/REPORTS:21021675::"Denies"} Difficulty climbing stairs: {ACTIONS;DENIES/REPORTS:21021675::"Denies"} Difficulty getting out of chair: {ACTIONS;DENIES/REPORTS:21021675::"Denies"} Difficulty using hands for taps, buttons, cutlery, and/or writing: {ACTIONS;DENIES/REPORTS:21021675::"Denies"}  No Rheumatology ROS completed.   PMFS History:  Patient Active Problem List   Diagnosis Date Noted  . Rheumatoid arthritis involving multiple sites with positive rheumatoid factor ([place]) 06/13/2016  . High risk medication use 06/13/2016  . Primary osteoarthritis of both feet 06/13/2016  . Insomnia 06/13/2016  . Acquired hypothyroidism 06/13/2016  . Migraine without status migrainosus, not intractable 06/13/2016  . Former smoker 06/13/2016  . Anxiety 01/08/2011    Past Medical History:  Diagnosis Date  . Anal fissure   . Anxiety   . Arthritis   . Depression   . Hypothyroidism   . Insomnia   . Migraine   . Osteoarthritis   . Rheumatoid arthritis ([place])   . Thyroid disease     Family History  Problem Relation Age of Onset  . Diabetes Mother   . Hypertension Mother   . Atrial fibrillation Father   . Prostate cancer Father    Past Surgical History:  Procedure Laterality Date  . ABDOMINAL HYSTERECTOMY    . CESAREAN SECTION    . HEMORRHOID SURGERY    . KNEE SURGERY     RT knee  . laproscopy    . ROBOTIC ASSISTED SALPINGO OOPHERECTOMY  Left 09/12/2014   Procedure: ROBOTIC ASSISTED Left SALPINGO OOPHORECTOMY/Right Salpingectomy/Pelvic Washings;  Surgeon: Princess Bruins, MD;  Location: [facility];  Service: Gynecology;  Laterality: Left;   Social History   Social History Narrative  . Not on file   Immunization History  Administered Date(s) Administered  . Hepatitis B 11/08/2009, 12/11/2009  . Influenza,trivalent, recombinat, inj, PF 05/17/2006, 05/03/2013  . Influenza-Unspecified 05/21/2017, 05/01/2018  . Pneumococcal Polysaccharide-23 04/10/2016  . Td 06/26/2003  . Tdap 03/06/2010, 01/13/2016  . Varicella 12/25/2009, 01/24/2010  . Zoster Recombinat (Shingrix) 09/24/2018, 03/17/2019     Objective: Vital Signs: There were no vitals taken for this visit.   Physical Exam   Musculoskeletal Exam: ***  CDAI Exam: CDAI Score: -- Patient Global: --; Provider Global: -- Swollen: --; Tender: -- Joint Exam 05/07/2020   No joint exam has been documented for this visit   There is currently no information documented on the homunculus. Go to the Rheumatology activity and complete the homunculus joint exam.  Investigation: No additional findings.  Imaging: No results found.  Recent Labs: Lab Results  Component Value Date   WBC 4.6 02/23/2020   HGB 13.1 02/23/2020   PLT 216 02/23/2020   NA 139 02/23/2020   K 3.8 02/23/2020   CL 107 02/23/2020   CO2 24 02/23/2020   GLUCOSE 82 02/23/2020   BUN 20 02/23/2020   CREATININE 1.02 02/23/2020   BILITOT 0.4 02/23/2020   ALKPHOS 72 03/04/2017   AST 16 02/23/2020   ALT 15 02/23/2020  PROT 6.7 02/23/2020   ALBUMIN 4.6 03/04/2017   CALCIUM 9.1 02/23/2020   GFRAA 73 02/23/2020   QFTBGOLDPLUS NEGATIVE 08/14/2019    Speciality Comments: No specialty comments available.  Procedures:  No procedures performed Allergies: Sulfur   Assessment / Plan:     Visit Diagnoses: No diagnosis found.  Orders: No orders of the defined types were placed in this  encounter.  No orders of the defined types were placed in this encounter.   Face-to-face time spent with patient was *** minutes. Greater than 50% of time was spent in counseling and coordination of care.  Follow-Up Instructions: No follow-ups on file.   Earnestine Mealing, CMA  Note - This record has been created using Editor, commissioning.  Chart creation errors have been sought, but may not always  have been located. Such creation errors do not reflect on  the standard of medical care.

## 2020-04-26 ENCOUNTER — Other Ambulatory Visit (HOSPITAL_BASED_OUTPATIENT_CLINIC_OR_DEPARTMENT_OTHER): Payer: Self-pay | Admitting: Rheumatology

## 2020-04-26 ENCOUNTER — Other Ambulatory Visit: Payer: Self-pay | Admitting: Rheumatology

## 2020-04-26 MED FILL — FOLIC ACID 1 MG TABS: 1 | 90 days supply | Qty: 180 | Fill #0

## 2020-04-26 NOTE — Telephone Encounter (Signed)
Last Visit: 12/05/2019  Next Visit: 05/07/2020  Current Dose per office note on 02/01/5499: folic acid 1 mg 2 tablets daily.    Okay to refill folic acid?

## 2020-05-03 ENCOUNTER — Other Ambulatory Visit (HOSPITAL_BASED_OUTPATIENT_CLINIC_OR_DEPARTMENT_OTHER): Payer: Self-pay | Admitting: Oral Surgery

## 2020-05-03 MED FILL — CHLORHEXIDINE 0.12% RINSE: 0.12 | 16 days supply | Qty: 473 | Fill #0

## 2020-05-03 MED FILL — AMOXICILLIN 500 MG CAPSULE: 500 | 5 days supply | Qty: 15 | Fill #0

## 2020-05-03 MED FILL — HYDROCODON-APAP 5-325: 5-325 | 2 days supply | Qty: 10 | Fill #0

## 2020-05-03 MED FILL — traZODone HCL 50 MG TABS: 50 | 90 days supply | Qty: 45 | Fill #0

## 2020-05-03 MED FILL — ONDANSETRON ODT 4 MG TABLET: 4 | 2 days supply | Qty: 6 | Fill #0

## 2020-05-03 MED FILL — AMITRIPTYLINE HCL 25 MG TAB: 25 | 30 days supply | Qty: 30 | Fill #1

## 2020-05-06 NOTE — Progress Notes (Signed)
Office Visit Note  Patient: Courtney Leonard             Date of Birth: 08/06/1965           MRN: [id number]             PCP: Deborah Chalk, FNP Referring: Deborah Chalk, FNP Visit Date: 05/09/2020 Occupation: @GUAROCC @  Subjective:  Medication monitoring   History of Present Illness: Courtney Leonard is a 54 y.o. female with history of seropositive rheumatoid arthritis and osteoarthritis.  Patient is on Humira 40 mg subcutaneous injections every 21 days, methotrexate 8 tablets by mouth once weekly, and folic acid 2 mg by mouth daily.  She has not had any recent rheumatoid arthritis flares.  She experiences some increased discomfort and stiffness in her hands 1 to 2 days leading up to her Humira injections but has not had any flares since spacing the dosing.  She denies any joint pain or joint swelling at this time.  She denies any morning stiffness or nocturnal pain.  She denies any difficulty with ADLs. She denies any recent infections.  She has received both COVID-19 vaccinations and plans on receiving the third dose soon.  She will also be getting the annual influenza vaccine through work.    Activities of Daily Living:  Patient reports morning stiffness for 30 minutes.   Patient Reports nocturnal pain.  Difficulty dressing/grooming: Denies Difficulty climbing stairs: Denies Difficulty getting out of chair: Denies Difficulty using hands for taps, buttons, cutlery, and/or writing: Denies  Review of Systems  Constitutional: Negative for fatigue.  HENT: Negative for mouth sores, mouth dryness and nose dryness.   Eyes: Negative for pain, visual disturbance and dryness.  Respiratory: Negative for cough, hemoptysis, shortness of breath and difficulty breathing.   Cardiovascular: Negative for chest pain, palpitations, hypertension and swelling in legs/feet.  Gastrointestinal: Negative for blood in stool, constipation and diarrhea.  Endocrine: Negative for increased urination.    Genitourinary: Negative for difficulty urinating and painful urination.  Musculoskeletal: Positive for arthralgias, joint pain and morning stiffness. Negative for joint swelling, myalgias, muscle weakness, muscle tenderness and myalgias.  Skin: Negative for color change, pallor, rash, hair loss, nodules/bumps, skin tightness, ulcers and sensitivity to sunlight.  Allergic/Immunologic: Negative for susceptible to infections.  Neurological: Negative for dizziness, headaches and weakness.  Hematological: Positive for bruising/bleeding tendency. Negative for swollen glands.  Psychiatric/Behavioral: Negative for depressed mood and sleep disturbance. The patient is not nervous/anxious.     PMFS History:  Patient Active Problem List   Diagnosis Date Noted   Rheumatoid arthritis involving multiple sites with positive rheumatoid factor ([place]) 06/13/2016   High risk medication use 06/13/2016   Primary osteoarthritis of both feet 06/13/2016   Insomnia 06/13/2016   Acquired hypothyroidism 06/13/2016   Migraine without status migrainosus, not intractable 06/13/2016   Former smoker 06/13/2016   Anxiety 01/08/2011    Past Medical History:  Diagnosis Date   Anal fissure    Anxiety    Arthritis    Depression    Hypothyroidism    Insomnia    Migraine    Osteoarthritis    Rheumatoid arthritis ([place])    Thyroid disease     Family History  Problem Relation Age of Onset   Diabetes Mother    Hypertension Mother    Atrial fibrillation Father    Prostate cancer Father    Past Surgical History:  Procedure Laterality Date   ABDOMINAL HYSTERECTOMY     CESAREAN SECTION  HEMORRHOID SURGERY     KNEE SURGERY     RT knee   laproscopy     ROBOTIC ASSISTED SALPINGO OOPHERECTOMY Left 09/12/2014   Procedure: ROBOTIC ASSISTED Left SALPINGO OOPHORECTOMY/Right Salpingectomy/Pelvic Washings;  Surgeon: Princess Bruins, MD;  Location: [facility];  Service: Gynecology;  Laterality:  Left;   Social History   Social History Narrative   Not on file   Immunization History  Administered Date(s) Administered   Hepatitis B 11/08/2009, 12/11/2009   Influenza,trivalent, recombinat, inj, PF 05/17/2006, 05/03/2013   Influenza-Unspecified 05/21/2017, 05/01/2018   PFIZER SARS-COV-2 Vaccination 08/04/2019, 08/25/2019   Pneumococcal Polysaccharide-23 04/10/2016   Td 06/26/2003   Tdap 03/06/2010, 01/13/2016   Varicella 12/25/2009, 01/24/2010   Zoster Recombinat (Shingrix) 09/24/2018, 03/17/2019     Objective: Vital Signs: BP 108/76 (BP Location: Left Arm, Patient Position: Sitting, Cuff Size: Normal)    Pulse 89    Resp 14    Ht 5\' 4"  (1.626 m)    Wt 159 lb (72.1 kg)    BMI 27.29 kg/m    Physical Exam Vitals and nursing note reviewed.  Constitutional:      Appearance: She is well-developed.  HENT:     Head: Normocephalic and atraumatic.  Eyes:     Conjunctiva/sclera: Conjunctivae normal.  Pulmonary:     Effort: Pulmonary effort is normal.  Abdominal:     Palpations: Abdomen is soft.  Musculoskeletal:     Cervical back: Normal range of motion.  Skin:    General: Skin is warm and dry.     Capillary Refill: Capillary refill takes less than 2 seconds.  Neurological:     Mental Status: She is alert and oriented to person, place, and time.  Psychiatric:        Behavior: Behavior normal.      Musculoskeletal Exam: C-spine, thoracic spine, and lumbar spine good ROM.  Shoulder joints, elbow joints, wrist joints, MCPs, PIPs, and DIPs good ROM with no synovitis.  Complete fist formation bilaterally.  Hip joints, knee joints, and ankle joints good ROM with no discomfort.  No warmth or effusion of knee joints noted.  No tenderness or swelling of ankle joints.   CDAI Exam: CDAI Score: 0  Patient Global: 0 mm; Provider Global: 0 mm Swollen: 0 ; Tender: 0  Joint Exam 05/09/2020   No joint exam has been documented for this visit   There is currently no  information documented on the homunculus. Go to the Rheumatology activity and complete the homunculus joint exam.  Investigation: No additional findings.  Imaging: No results found.  Recent Labs: Lab Results  Component Value Date   WBC 4.6 02/23/2020   HGB 13.1 02/23/2020   PLT 216 02/23/2020   NA 139 02/23/2020   K 3.8 02/23/2020   CL 107 02/23/2020   CO2 24 02/23/2020   GLUCOSE 82 02/23/2020   BUN 20 02/23/2020   CREATININE 1.02 02/23/2020   BILITOT 0.4 02/23/2020   ALKPHOS 72 03/04/2017   AST 16 02/23/2020   ALT 15 02/23/2020   PROT 6.7 02/23/2020   ALBUMIN 4.6 03/04/2017   CALCIUM 9.1 02/23/2020   GFRAA 73 02/23/2020   QFTBGOLDPLUS NEGATIVE 08/14/2019    Speciality Comments: No specialty comments available.  Procedures:  No procedures performed Allergies: Sulfur   Assessment / Plan:     Visit Diagnoses: Rheumatoid arthritis involving multiple sites with positive rheumatoid factor ([place]): She has no joint tenderness or synovitis on exam.  She has not had any recent rheumatoid arthritis  flares.  She is not experiencing any joint pain or joint swelling at this time.  Her morning stiffness has been lasting about 30 minutes daily.  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  She is clinically doing well on Humira 40 mg subcutaneous injections every 21 days, methotrexate 8 tablets by mouth once weekly, and folic acid 2 mg by mouth daily.  She noticed some increased discomfort and stiffness in her hands leading up to the Humira injections but denies any joint swelling at that time.  She will continue on the current treatment regimen.  She does not need any refills at this time.  She was advised to notify us if she develops increased joint pain or joint swelling.  She will follow-up in the office in 5 months.  High risk medication use - Humira 40 mg sq injections every 21 days, methotrexate 2.5 mg 8 tablets every 7 days, folic acid 1 mg 2  tablets daily.  CBC and CMP were drawn on 02/23/2020.  She is due to update lab work today.  Orders for CBC and CMP were released.  Standing orders for CBC and CMP are in place.  TB gold was negative on 08/14/2019 and will continue to be monitored yearly.  A future order for TB gold was placed today.- Plan: CBC with Differential/Platelet, COMPLETE METABOLIC PANEL WITH GFR, QuantiFERON-TB Gold Plus She has not had any recent infections.  She was advised to hold Humira and methotrexate if she develops signs or symptoms of an infection and to resume once the infection has completely cleared.  She has received both COVID-19 vaccinations and plans on receiving the third dose soon.  She was advised to avoid taking Tylenol and NSAIDs 24 hours prior to the third dose.  She is also advised to hold methotrexate 1 week after the third dose.  She was advised to notify us or her PCP if she develops a COVID-19 infection in order to receive the antibody infusion.  She was encouraged to continue to wear a mask and social distance.  She voiced understanding.   She will also be receiving the annual influenza vaccine at work.  Screening for tuberculosis -TB gold negative on 08/14/2019.  Future order for TB gold was placed today.  Plan: QuantiFERON-TB Gold Plus  Primary osteoarthritis of both feet: She is not experiencing any discomfort in her feet at this time.  She wears proper fitting shoes.  She has good range of motion of both ankle joints with no tenderness or inflammation on examination today.  Other medical conditions are listed as follows:   History of hypothyroidism  History of migraine  Primary insomnia  Former smoker  History of anxiety    Orders: Orders Placed This Encounter  Procedures   CBC with Differential/Platelet   COMPLETE METABOLIC PANEL WITH GFR   QuantiFERON-TB Gold Plus   No orders of the defined types were placed in this encounter.    Follow-Up Instructions: Return in about 5  months (around 10/07/2020) for Rheumatoid arthritis, Osteoarthritis.   Ofilia Neas, PA-C  Note - This record has been created using Dragon software.  Chart creation errors have been sought, but may not always  have been located. Such creation errors do not reflect on  the standard of medical care.

## 2020-05-07 ENCOUNTER — Ambulatory Visit: Payer: 59 | Admitting: Physician Assistant

## 2020-05-09 ENCOUNTER — Ambulatory Visit: Payer: 59 | Admitting: Physician Assistant

## 2020-05-09 ENCOUNTER — Other Ambulatory Visit: Payer: Self-pay

## 2020-05-09 ENCOUNTER — Encounter: Payer: Self-pay | Admitting: Physician Assistant

## 2020-05-09 VITALS — BP 108/76 | HR 89 | Resp 14 | Ht 64.0 in | Wt 159.0 lb

## 2020-05-09 DIAGNOSIS — Z111 Encounter for screening for respiratory tuberculosis: Secondary | ICD-10-CM

## 2020-05-09 DIAGNOSIS — M19071 Primary osteoarthritis, right ankle and foot: Secondary | ICD-10-CM | POA: Diagnosis not present

## 2020-05-09 DIAGNOSIS — Z87891 Personal history of nicotine dependence: Secondary | ICD-10-CM

## 2020-05-09 DIAGNOSIS — Z8639 Personal history of other endocrine, nutritional and metabolic disease: Secondary | ICD-10-CM | POA: Diagnosis not present

## 2020-05-09 DIAGNOSIS — Z79899 Other long term (current) drug therapy: Secondary | ICD-10-CM

## 2020-05-09 DIAGNOSIS — F5101 Primary insomnia: Secondary | ICD-10-CM

## 2020-05-09 DIAGNOSIS — M19072 Primary osteoarthritis, left ankle and foot: Secondary | ICD-10-CM

## 2020-05-09 DIAGNOSIS — Z8669 Personal history of other diseases of the nervous system and sense organs: Secondary | ICD-10-CM | POA: Diagnosis not present

## 2020-05-09 DIAGNOSIS — Z8659 Personal history of other mental and behavioral disorders: Secondary | ICD-10-CM

## 2020-05-09 DIAGNOSIS — M0579 Rheumatoid arthritis with rheumatoid factor of multiple sites without organ or systems involvement: Secondary | ICD-10-CM | POA: Diagnosis not present

## 2020-05-09 NOTE — Patient Instructions (Addendum)
Standing Labs We placed an order today for your standing lab work.   Please have your standing labs drawn in January and every 3 months   If possible, please have your labs drawn 2 weeks prior to your appointment so that the provider can discuss your results at your appointment.  We have open lab daily Monday through Thursday from 8:30-12:30 PM and 1:30-4:30 PM and Friday from 8:30-12:30 PM and 1:30-4:00 PM at the office of Dr. Shaili Deveshwar, [facility].   Please be advised, patients with office appointments requiring lab work will take precedents over walk-in lab work.  If possible, please come for your lab work on Monday and Friday afternoons, as you may experience shorter wait times. The office is located at [address], [address], [place], [place] [address] No appointment is necessary.   Labs are drawn by [facility]. Please bring your co-pay at the time of your lab draw.  You may receive a bill from [facility] for your lab work.  If you wish to have your labs drawn at another location, please call the office 24 hours in advance to send orders.  If you have any questions regarding directions or hours of operation,  please call [phone].   As a reminder, please drink plenty of water prior to coming for your lab work. Thanks! COVID-19 vaccine recommendations:   COVID-19 vaccine is recommended for everyone (unless you are allergic to a vaccine component), even if you are on a medication that suppresses your immune system.   If you are on Methotrexate, Cellcept (mycophenolate), Rinvoq, Xeljanz, and Olumiant- hold the medication for 1 week after each vaccine. Hold Methotrexate for 2 weeks after the single dose COVID-19 vaccine.   If you are on Orencia subcutaneous injection - hold medication one week prior to and one week after the first COVID-19 vaccine dose (only).   If you are on Orencia IV infusions- time vaccination administration so that the first COVID-19 vaccination  will occur four weeks after the infusion and postpone the subsequent infusion by one week.   If you are on Cyclophosphamide or Rituxan infusions please contact your doctor prior to receiving the COVID-19 vaccine.   Do not take Tylenol or any anti-inflammatory medications (NSAIDs) 24 hours prior to the COVID-19 vaccination.   There is no direct evidence about the efficacy of the COVID-19 vaccine in individuals who are on medications that suppress the immune system.   Even if you are fully vaccinated, and you are on any medications that suppress your immune system, please continue to wear a mask, maintain at least six feet social distance and practice hand hygiene.   If you develop a COVID-19 infection, please contact your PCP or our office to determine if you need antibody infusion.  The booster vaccine is now available for immunocompromised patients. It is advised that if you had Pfizer vaccine you should get Pfizer booster.  If you had a Moderna vaccine then you should get a Moderna booster. Johnson and Johnson does not have a booster vaccine at this time.  Please see the following web sites for updated information.   https://[email or web address]/[email or web address]/0/Files/COVID-19-Vaccination-Patient-Resources.pdf  https://www.[email or web address].org/About-Us/Newsroom/Press-Releases/ID/1159     

## 2020-05-10 LAB — CBC WITH DIFFERENTIAL/PLATELET
Absolute Monocytes: 430 cells/uL (ref 200–950)
Basophils Absolute: 50 cells/uL (ref 0–200)
Basophils Relative: 1 %
Eosinophils Absolute: 70 cells/uL (ref 15–500)
Eosinophils Relative: 1.4 %
HCT: 38.2 % (ref 35.0–45.0)
Hemoglobin: 12.8 g/dL (ref 11.7–15.5)
Lymphs Abs: 1795 cells/uL (ref 850–3900)
MCH: 33.1 pg — ABNORMAL HIGH (ref 27.0–33.0)
MCHC: 33.5 g/dL (ref 32.0–36.0)
MCV: 98.7 fL (ref 80.0–100.0)
MPV: 10 fL (ref 7.5–12.5)
Monocytes Relative: 8.6 %
Neutro Abs: 2655 cells/uL (ref 1500–7800)
Neutrophils Relative %: 53.1 %
Platelets: 269 10*3/uL (ref 140–400)
RBC: 3.87 10*6/uL (ref 3.80–5.10)
RDW: 12.8 % (ref 11.0–15.0)
Total Lymphocyte: 35.9 %
WBC: 5 10*3/uL (ref 3.8–10.8)

## 2020-05-10 LAB — COMPLETE METABOLIC PANEL WITH GFR
AG Ratio: 1.6 (calc) (ref 1.0–2.5)
ALT: 14 U/L (ref 6–29)
AST: 16 U/L (ref 10–35)
Albumin: 4.2 g/dL (ref 3.6–5.1)
Alkaline phosphatase (APISO): 77 U/L (ref 37–153)
BUN: 17 mg/dL (ref 7–25)
CO2: 26 mmol/L (ref 20–32)
Calcium: 9.4 mg/dL (ref 8.6–10.4)
Chloride: 106 mmol/L (ref 98–110)
Creat: 0.98 mg/dL (ref 0.50–1.05)
GFR, Est African American: 76 mL/min/{1.73_m2} (ref >=60)
GFR, Est Non African American: 66 mL/min/{1.73_m2} (ref >=60)
Globulin: 2.7 g/dL (calc) (ref 1.9–3.7)
Glucose, Bld: 81 mg/dL (ref 65–99)
Potassium: 4.1 mmol/L (ref 3.5–5.3)
Sodium: 141 mmol/L (ref 135–146)
Total Bilirubin: 0.2 mg/dL (ref 0.2–1.2)
Total Protein: 6.9 g/dL (ref 6.1–8.1)

## 2020-05-10 NOTE — Progress Notes (Signed)
CBC and CMP WNL

## 2020-05-13 MED FILL — HUMIRA PEN 40 MG/0.4ML PNKT: 40 | 42 days supply | Qty: 2 | Fill #1

## 2020-06-03 DIAGNOSIS — K641 Second degree hemorrhoids: Secondary | ICD-10-CM | POA: Diagnosis not present

## 2020-06-03 DIAGNOSIS — Z8371 Family history of colonic polyps: Secondary | ICD-10-CM | POA: Diagnosis not present

## 2020-06-03 DIAGNOSIS — Z800 Family history of malignant neoplasm of digestive organs: Secondary | ICD-10-CM | POA: Diagnosis not present

## 2020-06-03 DIAGNOSIS — Z8379 Family history of other diseases of the digestive system: Secondary | ICD-10-CM | POA: Diagnosis not present

## 2020-06-03 DIAGNOSIS — Z1211 Encounter for screening for malignant neoplasm of colon: Secondary | ICD-10-CM | POA: Diagnosis not present

## 2020-06-05 ENCOUNTER — Other Ambulatory Visit: Payer: Self-pay

## 2020-06-05 ENCOUNTER — Other Ambulatory Visit: Payer: Self-pay | Admitting: Rheumatology

## 2020-06-05 ENCOUNTER — Ambulatory Visit (HOSPITAL_BASED_OUTPATIENT_CLINIC_OR_DEPARTMENT_OTHER): Payer: 59 | Admitting: Pharmacist

## 2020-06-05 ENCOUNTER — Other Ambulatory Visit: Payer: Self-pay | Admitting: Pharmacist

## 2020-06-05 ENCOUNTER — Encounter: Payer: Self-pay | Admitting: Pharmacist

## 2020-06-05 DIAGNOSIS — M0579 Rheumatoid arthritis with rheumatoid factor of multiple sites without organ or systems involvement: Secondary | ICD-10-CM

## 2020-06-05 DIAGNOSIS — Z79899 Other long term (current) drug therapy: Secondary | ICD-10-CM

## 2020-06-05 MED ORDER — HUMIRA (2 PEN) 40 MG/0.4ML ~~LOC~~ AJKT: 40.0000 mg | AUTO-INJECTOR | SUBCUTANEOUS | 1 refills | Status: DC

## 2020-06-05 NOTE — Telephone Encounter (Signed)
Last Visit: 05/09/2020 Next Visit: 10/07/2020 Labs: 05/09/2020 CBC and CMP WNL.  TB Gold: 08/14/2019 Neg   Current Dose per office note 05/09/2020: Humira 40 mg sq injections every 21 days  DX: Rheumatoid arthritis involving multiple sites with positive rheumatoid factor   Okay to refill per Dr. Estanislado Pandy

## 2020-06-05 NOTE — Progress Notes (Signed)
° °  S: Patient presents for review of her specialty medication.  Patient is currently taking Humira for rheumatoid arthritis. Patient is managed by Dr. Estanislado Pandy for this.   Adherence: denies any missed doses.  Efficacy: reports that Humira continues to work well for her.   Drug-drug interactions: none  Screening: TB test: completed per patient Hepatitis: completed per patient  Monitoring: S/sx of infection: denies CBC: see below S/sx of hypersensitivity: denies S/sx of malignancy: denies S/sx of heart failure: denies  O:     Lab Results  Component Value Date   WBC 5.0 05/09/2020   HGB 12.8 05/09/2020   HCT 38.2 05/09/2020   MCV 98.7 05/09/2020   PLT 269 05/09/2020      Chemistry      Component Value Date/Time   NA 141 05/09/2020 1509   NA 139 04/20/2016 0000   K 4.1 05/09/2020 1509   CL 106 05/09/2020 1509   CO2 26 05/09/2020 1509   BUN 17 05/09/2020 1509   BUN 20 04/20/2016 0000   CREATININE 0.98 05/09/2020 1509   GLU 93 04/20/2016 0000      Component Value Date/Time   CALCIUM 9.4 05/09/2020 1509   ALKPHOS 72 03/04/2017 1414   AST 16 05/09/2020 1509   ALT 14 05/09/2020 1509   BILITOT 0.2 05/09/2020 1509       A/P: 1. Medication review: Patient currently on Humira for rheumatoid arthritis and is tolerating it well. Denies any adverse effects. Reviewed the medication with the patient, including the following: Humira is a TNF blocking agent indicated for ankylosing spondylitis, Crohn's disease, Hidradenitis suppurativa, psoriatic arthritis, plaque psoriasis, ulcerative colitis, and uveitis. The most common adverse effects are infections, headache, and injection site reactions. There is the possibility of an increased risk of malignancy but it is not well understood if this increased risk is due to there medication or the disease state. There are rare cases of pancytopenia and aplastic anemia. No recommendations for any changes at this time.   Benard Halsted, PharmD, [place] [phone]

## 2020-06-17 ENCOUNTER — Other Ambulatory Visit: Payer: Self-pay | Admitting: Rheumatology

## 2020-06-17 ENCOUNTER — Other Ambulatory Visit (HOSPITAL_BASED_OUTPATIENT_CLINIC_OR_DEPARTMENT_OTHER): Payer: Self-pay | Admitting: Rheumatology

## 2020-06-17 MED FILL — AMITRIPTYLINE HCL 25 MG TAB: 25 | 30 days supply | Qty: 30 | Fill #2

## 2020-06-17 MED FILL — VENLAFAXINE HCL ER 75 MG CA: 75 | 90 days supply | Qty: 270 | Fill #1

## 2020-06-17 MED FILL — TOPIRAMATE 100 MG TABLET: 100 | 90 days supply | Qty: 180 | Fill #0

## 2020-06-17 MED FILL — LEVOTHYROXINE SODIUM 88 MCG: 88 | 90 days supply | Qty: 90 | Fill #1

## 2020-06-17 MED FILL — METHOTREXATE 2.5 MG TAB: 2.5 | 84 days supply | Qty: 96 | Fill #0

## 2020-06-17 NOTE — Telephone Encounter (Signed)
Last Visit: 05/09/2020 Next Visit: 10/07/2020 Labs: 05/09/2020 CBC and CMP WNL.   Current Dose per office note 05/09/2020: methotrexate 2.5 mg 8 tablets every 7 days  DX: Rheumatoid arthritis involving multiple sites with positive rheumatoid factor   Okay to refill per Dr. Estanislado Pandy

## 2020-06-24 ENCOUNTER — Telehealth: Payer: Self-pay | Admitting: Pharmacy Technician

## 2020-06-24 ENCOUNTER — Other Ambulatory Visit: Payer: Self-pay | Admitting: Pharmacist

## 2020-06-24 DIAGNOSIS — M0579 Rheumatoid arthritis with rheumatoid factor of multiple sites without organ or systems involvement: Secondary | ICD-10-CM

## 2020-06-24 MED ORDER — HUMIRA (2 PEN) 40 MG/0.4ML ~~LOC~~ AJKT: 40.0000 mg | AUTO-INJECTOR | SUBCUTANEOUS | 1 refills | Status: DC

## 2020-06-24 NOTE — Telephone Encounter (Signed)
Prescription resent for the Humira 40mg /0.8 mL.

## 2020-06-24 NOTE — Telephone Encounter (Signed)
Patient has [employer] and they will no longer cover Humira CF. Patient will need to switch to Humira 40mg /0.54ml Pen. Her last refill was on 05/13/20.  Please send in new rx for patient.   Called [employer] and initiated new PA for Humira non- CF  Phone-[phone]

## 2020-06-25 MED ORDER — HUMIRA PEN 40 MG/0.8ML ~~LOC~~ PNKT: 40.0000 mg | PEN_INJECTOR | SUBCUTANEOUS | 1 refills | Status: DC

## 2020-06-25 NOTE — Telephone Encounter (Signed)
Called [employer] to check status of Humira PA- still pending. Another urgent request has been sent to the PA department. Will follow up after lunch to check status.

## 2020-06-25 NOTE — Addendum Note (Signed)
Addended by: Cassandria Anger on: 06/25/2020 01:34 PM   Modules accepted: Orders

## 2020-06-25 NOTE — Telephone Encounter (Signed)
PA is still pending. Patient aware. Will let her know once approved, so she can coordinate shipment with pharmacy.

## 2020-06-26 ENCOUNTER — Other Ambulatory Visit: Payer: Self-pay | Admitting: Pharmacist

## 2020-06-26 ENCOUNTER — Other Ambulatory Visit (HOSPITAL_COMMUNITY): Payer: Self-pay | Admitting: Internal Medicine

## 2020-06-26 DIAGNOSIS — M0579 Rheumatoid arthritis with rheumatoid factor of multiple sites without organ or systems involvement: Secondary | ICD-10-CM

## 2020-06-26 MED ORDER — HUMIRA PEN 40 MG/0.8ML ~~LOC~~ PNKT: 40.0000 mg | PEN_INJECTOR | SUBCUTANEOUS | 1 refills | Status: DC

## 2020-06-26 NOTE — Telephone Encounter (Addendum)
Received notification from [facility] regarding a prior authorization for HUMIRA. Authorization has been APPROVED from 06/26/20 to 06/25/21.   **Note patient is taking Humira 0.4mg /0.11mL every 21 day. Request is approved for 2 pens every 28 days.  Spoke with patient and she verbalized understanding. Will reach out to [facility] to coordinate shipment  [place] Phone [phone]  Knox Saliva, PharmD, MPH Clinical Pharmacist (Rheumatology and Pulmonology)

## 2020-07-01 MED FILL — HUMIRA PEN 40 MG/0.8ML PNKT: 40 | 42 days supply | Qty: 2 | Fill #0

## 2020-07-23 ENCOUNTER — Other Ambulatory Visit (HOSPITAL_COMMUNITY): Payer: Self-pay | Admitting: Emergency Medicine

## 2020-07-23 ENCOUNTER — Other Ambulatory Visit: Payer: Self-pay

## 2020-07-23 ENCOUNTER — Encounter (HOSPITAL_COMMUNITY): Payer: Self-pay | Admitting: *Deleted

## 2020-07-23 ENCOUNTER — Ambulatory Visit (HOSPITAL_COMMUNITY)
Admission: EM | Admit: 2020-07-23 | Discharge: 2020-07-23 | Disposition: A | Discharge status: Home / Self Care | Payer: 59 | Attending: Emergency Medicine | Admitting: Emergency Medicine

## 2020-07-23 DIAGNOSIS — N390 Urinary tract infection, site not specified: Secondary | ICD-10-CM | POA: Insufficient documentation

## 2020-07-23 LAB — POCT URINALYSIS DIPSTICK, ED / UC
Bilirubin Urine: NEGATIVE
Glucose, UA: NEGATIVE mg/dL
Hgb urine dipstick: NEGATIVE
Ketones, ur: NEGATIVE mg/dL
Nitrite: NEGATIVE
Protein, ur: NEGATIVE mg/dL
Specific Gravity, Urine: 1.02 (ref 1.005–1.030)
Urobilinogen, UA: 0.2 mg/dL (ref 0.0–1.0)
pH: 6.5 (ref 5.0–8.0)

## 2020-07-23 MED ORDER — CEFTRIAXONE SODIUM 1 G IJ SOLR
1.0000 g | Freq: Once | INTRAMUSCULAR | Status: AC
  Administered 2020-07-23: 1 g via INTRAMUSCULAR

## 2020-07-23 MED ORDER — PHENAZOPYRIDINE HCL 200 MG PO TABS: 200.0000 mg | ORAL_TABLET | Freq: Three times a day (TID) | ORAL | 0 refills | Status: DC | PRN

## 2020-07-23 MED ORDER — STERILE WATER FOR INJECTION IJ SOLN
INTRAMUSCULAR | Status: AC
  Filled 2020-07-23: qty 10

## 2020-07-23 MED ORDER — CEPHALEXIN 500 MG PO CAPS: 500.0000 mg | ORAL_CAPSULE | Freq: Four times a day (QID) | ORAL | 0 refills | Status: AC

## 2020-07-23 MED ORDER — CEFTRIAXONE SODIUM 1 G IJ SOLR
INTRAMUSCULAR | Status: AC
  Filled 2020-07-23: qty 10

## 2020-07-23 MED FILL — CEPHALEXIN 500 MG CAPSULE: 500 | 10 days supply | Qty: 40 | Fill #0

## 2020-07-23 MED FILL — PHENAZOPYRIDINE 200 MG TAB: 200 | 2 days supply | Qty: 6 | Fill #0

## 2020-07-23 NOTE — ED Triage Notes (Signed)
Pt reports UTI Sx's for 2 weeks . Pt also reports dysuria and odor to urine.

## 2020-07-23 NOTE — Discharge Instructions (Addendum)
I have given you a gram of Rocephin for possible pyelonephritis.  Finish the Keflex, even if you feel better.  Pyridium for urinary symptoms.  Push fluids.

## 2020-07-23 NOTE — ED Provider Notes (Signed)
HPI  SUBJECTIVE:  Courtney Leonard is a 53 y.o. female who presents with 2 weeks of dysuria, urgency, frequency, cloudy and odorous urine.  She reports bilateral back pain starting today.  Denies hematuria, vaginal odor, discharge, bleeding, genital rash or itching.  No nausea, vomiting, fevers, abdominal or pelvic pain.  She is in a long-term monogamous relationship with her husband who is asymptomatic.  STDs are not a concern today.  She states that she has not had intercourse in several months.  No antipyretic in the past 6 hours.  No recent antibiotics.  She tried Azo, pushing fluids and NSAIDs without improvement in her symptoms.  No aggravating factors.  She has a past medical history of rheumatoid arthritis currerntly on methotrexate and Humira, hypothyroidism, UTI.  No history of pyelonephritis, nephrolithiasis, diabetes, hypertension, STDs, bacterial vaginosis or yeast.  BUL:AGTXMI, Donalynn Furlong, FNP   Past Medical History:  Diagnosis Date   Anal fissure    Anxiety    Arthritis    Depression    Hypothyroidism    Insomnia    Migraine    Osteoarthritis    Rheumatoid arthritis ([place])    Thyroid disease     Past Surgical History:  Procedure Laterality Date   ABDOMINAL HYSTERECTOMY     CESAREAN SECTION     HEMORRHOID SURGERY     KNEE SURGERY     RT knee   laproscopy     ROBOTIC ASSISTED SALPINGO OOPHERECTOMY Left 09/12/2014   Procedure: ROBOTIC ASSISTED Left SALPINGO OOPHORECTOMY/Right Salpingectomy/Pelvic Washings;  Surgeon: Princess Bruins, MD;  Location: [facility];  Service: Gynecology;  Laterality: Left;    Family History  Problem Relation Age of Onset   Diabetes Mother    Hypertension Mother    Atrial fibrillation Father    Prostate cancer Father     Social History   Tobacco Use   Smoking status: Former Smoker    Packs/day: 0.10    Years: 5.00    Pack years: 0.50    Types: Cigarettes    Quit date: 06/28/2001    Years since quitting: 19.0    Smokeless tobacco: Never Used  Vaping Use   Vaping Use: Never used  Substance Use Topics   Alcohol use: No   Drug use: No     Current Facility-Administered Medications:    cefTRIAXone (ROCEPHIN) injection 1 g, 1 g, Intramuscular, Once, Melynda Ripple, MD  Current Outpatient Medications:    Adalimumab (HUMIRA PEN) 40 MG/0.8ML PNKT, Inject 40 mg into the skin every 21 ( twenty-one) days., Disp: 2 each, Rfl: 1   ALPRAZolam (XANAX) 0.5 MG tablet, Take 0.5 mg by mouth as needed. , Disp: , Rfl:    amitriptyline (ELAVIL) 25 MG tablet, Take 25 mg by mouth at bedtime., Disp: , Rfl:    bisacodyl (DULCOLAX) 5 MG EC tablet, Take by mouth., Disp: , Rfl:    diclofenac sodium (VOLTAREN) 1 % GEL, Apply a pea sized amount to right elbow up to 4 times daily as needed for right elbow pain., Disp: , Rfl:    folic acid (FOLVITE) 1 MG tablet, TAKE 2 TABLETS BY MOUTH DAILY, Disp: 180 tablet, Rfl: 3   methotrexate (RHEUMATREX) 2.5 MG tablet, TAKE 8 TABLETS BY MOUTH ONCE A WEEK, Disp: 96 tablet, Rfl: 0   SUMAtriptan (IMITREX) 50 MG tablet, Take 50 mg by mouth every 2 (two) hours as needed for migraine or headache. May repeat in 2 hours if headache persists or recurs., Disp: , Rfl:  topiramate (TOPAMAX) 100 MG tablet, Take 100 mg by mouth 2 (two) times daily., Disp: , Rfl:    traZODone (DESYREL) 50 MG tablet, Take 50 mg by mouth., Disp: , Rfl:    venlafaxine XR (EFFEXOR-XR) 75 MG 24 hr capsule, Take 225 mg by mouth daily., Disp: , Rfl:    levothyroxine (SYNTHROID, LEVOTHROID) 88 MCG tablet, Take by mouth., Disp: , Rfl:    OnabotulinumtoxinA (BOTOX IJ), Inject as directed every 3 (three) months., Disp: , Rfl:   Allergies  Allergen Reactions   Sulfur      ROS  As noted in HPI.   Physical Exam  BP (!) 123/102 (BP Location: Right Arm)    Pulse 75    Temp 98 F (36.7 C) (Oral)    Resp 18    SpO2 100%   Constitutional: Well developed, well nourished, no acute distress Eyes:  EOMI,  conjunctiva normal bilaterally HENT: Normocephalic, atraumatic,mucus membranes moist Respiratory: Normal inspiratory effort Cardiovascular: Normal rate GI: nondistended soft.  No suprapubic, flank tenderness. Back: mild left CVAT skin: No rash, skin intact Musculoskeletal: no deformities Neurologic: Alert & oriented x 3, no focal neuro deficits Psychiatric: Speech and behavior appropriate   ED Course   Medications  cefTRIAXone (ROCEPHIN) injection 1 g (has no administration in time range)    Orders Placed This Encounter  Procedures   Urine culture    Standing Status:   Standing    Number of Occurrences:   1    Order Specific Question:   List patient's active antibiotics    Answer:   rocephin x 1 keflex   POC Urinalysis dipstick    Standing Status:   Standing    Number of Occurrences:   1    Results for orders placed or performed during the hospital encounter of 07/23/20 (from the past 24 hour(s))  POC Urinalysis dipstick     Status: Abnormal   Collection Time: 07/23/20  2:48 PM  Result Value Ref Range   Glucose, UA NEGATIVE NEGATIVE mg/dL   Bilirubin Urine NEGATIVE NEGATIVE   Ketones, ur NEGATIVE NEGATIVE mg/dL   Specific Gravity, Urine 1.020 1.005 - 1.030   Hgb urine dipstick NEGATIVE NEGATIVE   pH 6.5 5.0 - 8.0   Protein, ur NEGATIVE NEGATIVE mg/dL   Urobilinogen, UA 0.2 0.0 - 1.0 mg/dL   Nitrite NEGATIVE NEGATIVE   Leukocytes,Ua MODERATE (A) NEGATIVE   No results found.  ED Clinical Impression  1. Complicated UTI (urinary tract infection)      ED Assessment/Plan  Patient with moderate leukocytes.  Presentation consistent with complicated UTI, possibly early pyelonephritis.  I would not expect her to mount a fever because she is on immunosuppressants.  Will give 1 g of Rocephin here to cover pyelonephritis and send home with Keflex 4 times daily for 10 days.  Urine culture sent to confirm antibiotic choice.  Pyridium, push fluids, ER return precautions  given.   Discussed labs, , MDM, treatment plan, and plan for follow-up with patient. Discussed sn/sx that should prompt return to the ED. patient agrees with plan.   Meds ordered this encounter  Medications   cefTRIAXone (ROCEPHIN) injection 1 g    *This clinic note was created using [employer]. Therefore, there may be occasional mistakes despite careful proofreading.   ?    Melynda Ripple, MD 07/24/20 [phone]

## 2020-07-25 LAB — URINE CULTURE: Culture: >=100000 — AB

## 2020-08-05 MED FILL — AMITRIPTYLINE HCL 25 MG TAB: 25 | 30 days supply | Qty: 30 | Fill #3

## 2020-08-05 MED FILL — FOLIC ACID 1 MG TABS: 1 | 90 days supply | Qty: 180 | Fill #1

## 2020-08-05 MED FILL — traZODone HCL 50 MG TABS: 50 | 90 days supply | Qty: 45 | Fill #1

## 2020-08-06 MED FILL — HUMIRA PEN 40 MG/0.8ML PNKT: 40 | 42 days supply | Qty: 2 | Fill #1

## 2020-08-24 ENCOUNTER — Encounter: Payer: Self-pay | Admitting: Emergency Medicine

## 2020-08-24 ENCOUNTER — Emergency Department
Admission: EM | Admit: 2020-08-24 | Discharge: 2020-08-24 | Disposition: A | Discharge status: Home / Self Care | Payer: 59 | Source: Home / Self Care

## 2020-08-24 ENCOUNTER — Other Ambulatory Visit: Payer: Self-pay

## 2020-08-24 DIAGNOSIS — J029 Acute pharyngitis, unspecified: Secondary | ICD-10-CM

## 2020-08-24 DIAGNOSIS — J039 Acute tonsillitis, unspecified: Secondary | ICD-10-CM | POA: Diagnosis not present

## 2020-08-24 LAB — POCT RAPID STREP A (OFFICE): Rapid Strep A Screen: NEGATIVE

## 2020-08-24 MED ORDER — CLINDAMYCIN HCL 300 MG PO CAPS: 300.0000 mg | ORAL_CAPSULE | Freq: Three times a day (TID) | ORAL | 0 refills | Status: AC

## 2020-08-24 NOTE — ED Triage Notes (Signed)
Patient c/o left ear pain x 2 days, some throat pain, redness, head congestion.  No cough, headache.  Patient had COVID about 3 weeks ago.  Patient is vaccinated.

## 2020-08-24 NOTE — ED Provider Notes (Signed)
[name]    CSN: [id number] Arrival date & time: 08/24/20  1125      History   Chief Complaint Chief Complaint  Patient presents with  . Otalgia    HPI Courtney Leonard is a 55 y.o. female.   HPI  Courtney Leonard is a 55 y.o. female presenting to UC with c/o Left ear pain and sore throat for about 2 days.  She has noticed redness and white patches in her throat. Associated mild head congestion. Denies fever, chills, n/v/d. Denies cough. She does report having COVID 3 weeks ago. Was fatigued last week but was feeling better this week until the ear and throat pain developed.    Past Medical History:  Diagnosis Date  . Anal fissure   . Anxiety   . Arthritis   . Depression   . Hypothyroidism   . Insomnia   . Migraine   . Osteoarthritis   . Rheumatoid arthritis ([place])   . Thyroid disease     Patient Active Problem List   Diagnosis Date Noted  . Rheumatoid arthritis involving multiple sites with positive rheumatoid factor ([place]) 06/13/2016  . High risk medication use 06/13/2016  . Primary osteoarthritis of both feet 06/13/2016  . Insomnia 06/13/2016  . Acquired hypothyroidism 06/13/2016  . Migraine without status migrainosus, not intractable 06/13/2016  . Former smoker 06/13/2016  . Anxiety 01/08/2011    Past Surgical History:  Procedure Laterality Date  . ABDOMINAL HYSTERECTOMY    . CESAREAN SECTION    . HEMORRHOID SURGERY    . KNEE SURGERY     RT knee  . laproscopy    . ROBOTIC ASSISTED SALPINGO OOPHERECTOMY Left 09/12/2014   Procedure: ROBOTIC ASSISTED Left SALPINGO OOPHORECTOMY/Right Salpingectomy/Pelvic Washings;  Surgeon: Princess Bruins, MD;  Location: [facility];  Service: Gynecology;  Laterality: Left;    OB History   No obstetric history on file.      Home Medications    Prior to Admission medications   Medication Sig Start Date End Date Taking? Authorizing Provider  Adalimumab (HUMIRA PEN) 40 MG/0.8ML PNKT Inject 40 mg into the  skin every 21 ( twenty-one) days. 06/26/20  Yes Tresa Garter, MD  ALPRAZolam Duanne Moron) 0.5 MG tablet Take 0.5 mg by mouth as needed.  04/10/16  Yes [provider]  amitriptyline (ELAVIL) 25 MG tablet Take 25 mg by mouth at bedtime. 11/21/19  Yes [provider]  bisacodyl (DULCOLAX) 5 MG EC tablet Take by mouth.   Yes [provider]  clindamycin (CLEOCIN) 300 MG capsule Take 1 capsule (300 mg total) by mouth 3 (three) times daily for 7 days. X 7 days 08/24/20 08/31/20 Yes Jakiah Goree O, PA-C  diclofenac sodium (VOLTAREN) 1 % GEL Apply a pea sized amount to right elbow up to 4 times daily as needed for right elbow pain. 04/26/18  Yes [provider]  folic acid (FOLVITE) 1 MG tablet TAKE 2 TABLETS BY MOUTH DAILY 04/26/20  Yes Deveshwar, Abel Presto, MD  levothyroxine (SYNTHROID, LEVOTHROID) 88 MCG tablet Take by mouth. 08/26/18  Yes [provider]  methotrexate (RHEUMATREX) 2.5 MG tablet TAKE 8 TABLETS BY MOUTH ONCE A WEEK 06/17/20  Yes Deveshwar, Abel Presto, MD  OnabotulinumtoxinA (BOTOX IJ) Inject as directed every 3 (three) months.   Yes [provider]  SUMAtriptan (IMITREX) 50 MG tablet Take 50 mg by mouth every 2 (two) hours as needed for migraine or headache. May repeat in 2 hours if headache persists or recurs.  Yes [provider]  topiramate (TOPAMAX) 100 MG tablet Take 100 mg by mouth 2 (two) times daily.   Yes [provider]  traZODone (DESYREL) 50 MG tablet Take 50 mg by mouth. 11/21/15  Yes [provider]  venlafaxine XR (EFFEXOR-XR) 75 MG 24 hr capsule Take 225 mg by mouth daily. 11/21/19  Yes [provider]  phenazopyridine (PYRIDIUM) 200 MG tablet Take 1 tablet (200 mg total) by mouth 3 (three) times daily as needed for pain. 07/23/20   Melynda Ripple, MD    Family History Family History  Problem Relation Age of Onset  . Diabetes Mother   . Hypertension Mother   . Atrial fibrillation Father    . Prostate cancer Father     Social History Social History   Tobacco Use  . Smoking status: Former Smoker    Packs/day: 0.10    Years: 5.00    Pack years: 0.50    Types: Cigarettes    Quit date: 06/28/2001    Years since quitting: 19.1  . Smokeless tobacco: Never Used  Vaping Use  . Vaping Use: Never used  Substance Use Topics  . Alcohol use: No  . Drug use: No     Allergies   Elemental sulfur   Review of Systems Review of Systems  Constitutional: Negative for chills and fever.  HENT: Positive for congestion, ear pain and sore throat. Negative for trouble swallowing and voice change.   Respiratory: Negative for cough and shortness of breath.   Cardiovascular: Negative for chest pain and palpitations.  Gastrointestinal: Negative for abdominal pain, diarrhea, nausea and vomiting.  Musculoskeletal: Negative for arthralgias, back pain and myalgias.  Skin: Negative for rash.  Neurological: Negative for dizziness, light-headedness and headaches.  All other systems reviewed and are negative.    Physical Exam Triage Vital Signs ED Triage Vitals [08/24/20 1141]  Enc Vitals Group     BP 108/76     Pulse Rate 96     Resp      Temp 99.5 F (37.5 C)     Temp Source Oral     SpO2 98 %     Weight      Height      Head Circumference      Peak Flow      Pain Score 9     Pain Loc      Pain Edu?      Excl. in [place]?    No data found.  Updated Vital Signs BP 108/76 (BP Location: Left Arm)   Pulse 96   Temp 99.5 F (37.5 C) (Oral)   SpO2 98%   Visual Acuity Right Eye Distance:   Left Eye Distance:   Bilateral Distance:    Right Eye Near:   Left Eye Near:    Bilateral Near:     Physical Exam Vitals and nursing note reviewed.  Constitutional:      General: She is not in acute distress.    Appearance: Normal appearance. She is well-developed and well-nourished. She is not ill-appearing, toxic-appearing or diaphoretic.  HENT:     Head: Normocephalic and  atraumatic.     Right Ear: Tympanic membrane and ear canal normal.     Left Ear: Tympanic membrane and ear canal normal.     Nose: Nose normal.     Right Sinus: No maxillary sinus tenderness or frontal sinus tenderness.     Left Sinus: No maxillary sinus tenderness or frontal sinus tenderness.  Mouth/Throat:     Lips: Pink.     Mouth: Mucous membranes are moist.     Pharynx: Oropharyngeal exudate, posterior oropharyngeal erythema and uvula swelling present. No pharyngeal swelling.     Tonsils: Tonsillar exudate ( mainly Left side) present. 2+ on the right. 3+ on the left.  Eyes:     Extraocular Movements: EOM normal.  Cardiovascular:     Rate and Rhythm: Normal rate and regular rhythm.  Pulmonary:     Effort: Pulmonary effort is normal. No respiratory distress.     Breath sounds: Normal breath sounds. No stridor. No wheezing, rhonchi or rales.  Musculoskeletal:        General: Normal range of motion.     Cervical back: Normal range of motion and neck supple. Tenderness present. No rigidity.  Lymphadenopathy:     Cervical: Cervical adenopathy present.  Skin:    General: Skin is warm and dry.  Neurological:     Mental Status: She is alert and oriented to person, place, and time.  Psychiatric:        Mood and Affect: Mood and affect normal.        Behavior: Behavior normal.      UC Treatments / Results  Labs (all labs ordered are listed, but only abnormal results are displayed) Labs Reviewed  CULTURE, GROUP A STREP  STREP A DNA PROBE  POCT RAPID STREP A (OFFICE)    EKG   Radiology No results found.  Procedures Procedures (including critical care time)  Medications Ordered in UC Medications - No data to display  Initial Impression / Assessment and Plan / UC Course  I have reviewed the triage vital signs and the nursing notes.  Pertinent labs & imaging results that were available during my care of the patient were reviewed by me and considered in my medical  decision making (see chart for details).     Rapid strep: NEGATIVE Culture sent Normal Left ear exam Oropharynx exam concerning for early Left tonsillar abscess. Uvula is still midline. Will start on Clindamycin while culture pending Encouraged close f/u Discussed symptoms that warrant emergent care in the ED.  Final Clinical Impressions(s) / UC Diagnoses   Final diagnoses:  Sore throat  Exudative tonsillitis     Discharge Instructions      There is concern for an early tonsil abscess. Be sure to take the medication as prescribed and follow up in 2-3 days if not improving.   Call 911 or have someone drive you to the hospital if symptoms significantly worsening- worsening pain, difficulty breathing or swallowing or other new concerning symptoms develop.      ED Prescriptions    Medication Sig Dispense Auth. Provider   clindamycin (CLEOCIN) 300 MG capsule Take 1 capsule (300 mg total) by mouth 3 (three) times daily for 7 days. X 7 days 21 capsule Noe Gens, [place]     PDMP not reviewed this encounter.   Noe Gens, [place] 08/24/20 1253

## 2020-08-24 NOTE — Discharge Instructions (Signed)
  There is concern for an early tonsil abscess. Be sure to take the medication as prescribed and follow up in 2-3 days if not improving.   Call 911 or have someone drive you to the hospital if symptoms significantly worsening- worsening pain, difficulty breathing or swallowing or other new concerning symptoms develop.

## 2020-08-26 LAB — CULTURE, GROUP A STREP
MICRO NUMBER:: 11447371
SPECIMEN QUALITY:: ADEQUATE

## 2020-08-28 ENCOUNTER — Other Ambulatory Visit: Payer: Self-pay | Admitting: Rheumatology

## 2020-08-28 DIAGNOSIS — M0579 Rheumatoid arthritis with rheumatoid factor of multiple sites without organ or systems involvement: Secondary | ICD-10-CM

## 2020-08-28 NOTE — Telephone Encounter (Signed)
Patient states she was diagnosed with Covid on 08/06/2020. Patient states she did not receive the monoclonal antibody infusion. Patient states her symptoms resolved completely resolved 08/20/2020. Patient advised she may resume her Humira the week of September 03, 2020. Patient advised she can resume at her MTX on 09/06/2020 as she is receiving her booster on Friday. Patient expressed understanding. Patient advised once we receive her lab results we can refill her medication.

## 2020-08-28 NOTE — Telephone Encounter (Signed)
Please clarify if the patient received the monoclonal antibody infusion and if she is holding humira.   She should hold humira until 2 weeks after symptoms have completely resolved. Ok to refill once lab work has been updated.

## 2020-08-28 NOTE — Telephone Encounter (Signed)
Last Visit: 05/09/2020 Next Visit: 10/07/2020 Labs: 05/09/2020, CBC and CMP WNL.  TB Gold: 08/14/2019,  Current Dose per office note 05/09/2020,  Humira 40 mg subcutaneous injections every 21 day  Seth Bake called patient and advised TB Gold and labs due. Patient had COVID and was not able to come into office. Patient will have labs drawn on 08/30/2020.  FX:[id number] arthritis involving multiple sites with positive rheumatoid factor   Okay to refill Humira?

## 2020-08-29 DIAGNOSIS — J039 Acute tonsillitis, unspecified: Secondary | ICD-10-CM | POA: Diagnosis not present

## 2020-08-30 ENCOUNTER — Other Ambulatory Visit: Payer: Self-pay | Admitting: *Deleted

## 2020-08-30 DIAGNOSIS — Z79899 Other long term (current) drug therapy: Secondary | ICD-10-CM | POA: Diagnosis not present

## 2020-08-30 DIAGNOSIS — Z111 Encounter for screening for respiratory tuberculosis: Secondary | ICD-10-CM

## 2020-09-01 LAB — COMPLETE METABOLIC PANEL WITH GFR
AG Ratio: 1.5 (calc) (ref 1.0–2.5)
ALT: 10 U/L (ref 6–29)
AST: 12 U/L (ref 10–35)
Albumin: 4.3 g/dL (ref 3.6–5.1)
Alkaline phosphatase (APISO): 84 U/L (ref 37–153)
BUN: 16 mg/dL (ref 7–25)
CO2: 28 mmol/L (ref 20–32)
Calcium: 9.3 mg/dL (ref 8.6–10.4)
Chloride: 108 mmol/L (ref 98–110)
Creat: 0.88 mg/dL (ref 0.50–1.05)
GFR, Est African American: 87 mL/min/{1.73_m2} (ref >=60)
GFR, Est Non African American: 75 mL/min/{1.73_m2} (ref >=60)
Globulin: 2.9 g/dL (calc) (ref 1.9–3.7)
Glucose, Bld: 77 mg/dL (ref 65–99)
Potassium: 4 mmol/L (ref 3.5–5.3)
Sodium: 142 mmol/L (ref 135–146)
Total Bilirubin: 0.3 mg/dL (ref 0.2–1.2)
Total Protein: 7.2 g/dL (ref 6.1–8.1)

## 2020-09-01 LAB — CBC WITH DIFFERENTIAL/PLATELET
Absolute Monocytes: 589 cells/uL (ref 200–950)
Basophils Absolute: 49 cells/uL (ref 0–200)
Basophils Relative: 0.9 %
Eosinophils Absolute: 70 cells/uL (ref 15–500)
Eosinophils Relative: 1.3 %
HCT: 38.7 % (ref 35.0–45.0)
Hemoglobin: 13 g/dL (ref 11.7–15.5)
Lymphs Abs: 1998 cells/uL (ref 850–3900)
MCH: 33 pg (ref 27.0–33.0)
MCHC: 33.6 g/dL (ref 32.0–36.0)
MCV: 98.2 fL (ref 80.0–100.0)
MPV: 10.6 fL (ref 7.5–12.5)
Monocytes Relative: 10.9 %
Neutro Abs: 2695 cells/uL (ref 1500–7800)
Neutrophils Relative %: 49.9 %
Platelets: 281 10*3/uL (ref 140–400)
RBC: 3.94 10*6/uL (ref 3.80–5.10)
RDW: 13.2 % (ref 11.0–15.0)
Total Lymphocyte: 37 %
WBC: 5.4 10*3/uL (ref 3.8–10.8)

## 2020-09-01 LAB — QUANTIFERON-TB GOLD PLUS
Mitogen-NIL: >10 IU/mL
NIL: 0.03 IU/mL
QuantiFERON-TB Gold Plus: NEGATIVE
TB1-NIL: <0 IU/mL
TB2-NIL: <0 IU/mL

## 2020-09-02 ENCOUNTER — Other Ambulatory Visit: Payer: Self-pay | Admitting: Pharmacist

## 2020-09-02 ENCOUNTER — Other Ambulatory Visit (HOSPITAL_COMMUNITY): Payer: Self-pay | Admitting: Internal Medicine

## 2020-09-02 ENCOUNTER — Telehealth: Payer: Self-pay | Admitting: *Deleted

## 2020-09-02 DIAGNOSIS — M0579 Rheumatoid arthritis with rheumatoid factor of multiple sites without organ or systems involvement: Secondary | ICD-10-CM

## 2020-09-02 MED ORDER — HUMIRA PEN 40 MG/0.8ML ~~LOC~~ PNKT: 40.0000 mg | PEN_INJECTOR | SUBCUTANEOUS | 1 refills | Status: DC

## 2020-09-02 NOTE — Telephone Encounter (Signed)
-----   Message from Ofilia Neas, PA-C sent at 09/02/2020  8:13 AM EST ----- CBC and CMP WNL.  TB gold negative.

## 2020-09-02 NOTE — Telephone Encounter (Signed)
Last Visit: 05/09/2020 Next Visit: 10/07/2020 Labs: 08/30/2020 WNL TB Gold: 08/30/2020 Neg   Current Dose per office note 05/09/2020: Humira 40 mg sq injections every 21 days  DX:  Rheumatoid arthritis involving multiple sites with positive rheumatoid factor   Okay to refill per Dr. Estanislado Pandy

## 2020-09-02 NOTE — Progress Notes (Signed)
CBC and CMP WNL.  TB gold negative.

## 2020-09-09 MED FILL — HUMIRA PEN 40 MG/0.8ML PNKT: 40 | 42 days supply | Qty: 2 | Fill #0

## 2020-09-12 DIAGNOSIS — J039 Acute tonsillitis, unspecified: Secondary | ICD-10-CM | POA: Diagnosis not present

## 2020-09-18 ENCOUNTER — Other Ambulatory Visit: Payer: Self-pay | Admitting: Rheumatology

## 2020-09-18 ENCOUNTER — Other Ambulatory Visit (HOSPITAL_BASED_OUTPATIENT_CLINIC_OR_DEPARTMENT_OTHER): Payer: Self-pay | Admitting: Physician Assistant

## 2020-09-18 MED FILL — TOPIRAMATE 100 MG TABLET: 100 | 90 days supply | Qty: 180 | Fill #1

## 2020-09-18 MED FILL — AMITRIPTYLINE HCL 25 MG TAB: 25 | 30 days supply | Qty: 30 | Fill #4

## 2020-09-18 MED FILL — METHOTREXATE SODIUM 2.5 MG: 2.5 | 90 days supply | Qty: 96 | Fill #0

## 2020-09-18 NOTE — Telephone Encounter (Signed)
Last Visit: 05/09/2020 Next Visit: 10/07/2020 Labs: 08/30/2020, CBC and CMP WNL. TB gold negative.  Current Dose per office note 05/09/2020, methotrexate 2.5 mg 8 tablets every 7 days DX: Rheumatoid arthritis involving multiple sites with positive rheumatoid factor   Last Fill: 06/17/2020  Okay to refill MTX?

## 2020-09-19 ENCOUNTER — Other Ambulatory Visit (HOSPITAL_BASED_OUTPATIENT_CLINIC_OR_DEPARTMENT_OTHER): Payer: Self-pay | Admitting: Nurse Practitioner

## 2020-09-19 MED FILL — LEVOTHYROXINE SODIUM 88 MCG: 88 | 30 days supply | Qty: 30 | Fill #0

## 2020-09-19 MED FILL — VENLAFAXINE HCL ER 75 MG CA: 75 | 30 days supply | Qty: 90 | Fill #0

## 2020-09-23 NOTE — Progress Notes (Signed)
Office Visit Note  Patient: Courtney Leonard             Date of Birth: 1965-11-24           MRN: [id number]             PCP: Deborah Chalk, FNP Referring: Deborah Chalk, FNP Visit Date: 10/07/2020 Occupation: @GUAROCC @  Subjective:  Other (Right hand pain )   History of Present Illness: Courtney Leonard is a 55 y.o. female with a history of seropositive rheumatoid arthritis.  She states for the last few months she has been having increased pain and discomfort in her right hand.  She has not noticed any swelling but she is having difficulty holding objects and making a fist.  None of the other joints are painful or swollen.  She has been spacing Humira every third week for almost 1 year now.  The dosing was displaced as she was clinically doing well.  She continues to take methotrexate 8 tablets p.o. weekly along with folic acid.  Activities of Daily Living:  Patient reports morning stiffness for 0 minutes.   Patient Denies nocturnal pain.  Difficulty dressing/grooming: Reports Difficulty climbing stairs: Denies Difficulty getting out of chair: Denies Difficulty using hands for taps, buttons, cutlery, and/or writing: Reports  Review of Systems  Constitutional: Negative for fatigue, night sweats, weight gain and weight loss.  HENT: Negative for mouth sores, trouble swallowing, trouble swallowing, mouth dryness and nose dryness.   Eyes: Positive for itching. Negative for pain, redness, visual disturbance and dryness.  Respiratory: Negative for cough, shortness of breath and difficulty breathing.   Cardiovascular: Negative for chest pain, palpitations, hypertension, irregular heartbeat and swelling in legs/feet.  Gastrointestinal: Negative for blood in stool, constipation and diarrhea.  Endocrine: Negative for increased urination.  Genitourinary: Negative for difficulty urinating and vaginal dryness.  Musculoskeletal: Positive for arthralgias and joint pain. Negative for joint  swelling, myalgias, muscle weakness, morning stiffness, muscle tenderness and myalgias.  Skin: Negative for color change, rash, hair loss, redness, skin tightness, ulcers and sensitivity to sunlight.  Allergic/Immunologic: Negative for susceptible to infections.  Neurological: Negative for dizziness, numbness, headaches, memory loss, night sweats and weakness.  Hematological: Negative for bruising/bleeding tendency and swollen glands.  Psychiatric/Behavioral: Positive for sleep disturbance. Negative for depressed mood and confusion. The patient is not nervous/anxious.     PMFS History:  Patient Active Problem List   Diagnosis Date Noted  . Rheumatoid arthritis involving multiple sites with positive rheumatoid factor ([place]) 06/13/2016  . High risk medication use 06/13/2016  . Primary osteoarthritis of both feet 06/13/2016  . Insomnia 06/13/2016  . Acquired hypothyroidism 06/13/2016  . Migraine without status migrainosus, not intractable 06/13/2016  . Former smoker 06/13/2016  . Anxiety 01/08/2011    Past Medical History:  Diagnosis Date  . Anal fissure   . Anxiety   . Arthritis   . Depression   . Hypothyroidism   . Insomnia   . Migraine   . Osteoarthritis   . Rheumatoid arthritis ([place])   . Thyroid disease     Family History  Problem Relation Age of Onset  . Diabetes Mother   . Hypertension Mother   . Atrial fibrillation Father   . Prostate cancer Father    Past Surgical History:  Procedure Laterality Date  . ABDOMINAL HYSTERECTOMY    . CESAREAN SECTION    . HEMORRHOID SURGERY    . KNEE SURGERY     RT knee  .  laproscopy    . MOUTH SURGERY    . ROBOTIC ASSISTED SALPINGO OOPHERECTOMY Left 09/12/2014   Procedure: ROBOTIC ASSISTED Left SALPINGO OOPHORECTOMY/Right Salpingectomy/Pelvic Washings;  Surgeon: Princess Bruins, MD;  Location: [facility];  Service: Gynecology;  Laterality: Left;   Social History   Social History Narrative  . Not on file   Immunization History   Administered Date(s) Administered  . Hepatitis B 11/08/2009, 12/11/2009  . Influenza,trivalent, recombinat, inj, PF 05/17/2006, 05/03/2013  . Influenza-Unspecified 05/21/2017, 05/01/2018  . PFIZER(Purple Top)SARS-COV-2 Vaccination 08/04/2019, 08/25/2019  . Pneumococcal Polysaccharide-23 04/10/2016  . Td 06/26/2003  . Tdap 03/06/2010, 01/13/2016  . Varicella 12/25/2009, 01/24/2010  . Zoster Recombinat (Shingrix) 09/24/2018, 03/17/2019     Objective: Vital Signs: BP 101/73 (BP Location: Left Arm, Patient Position: Sitting, Cuff Size: Normal)   Pulse 83   Resp 14   Ht 5\' 4"  (1.626 m)   Wt 161 lb 9.6 oz (73.3 kg)   BMI 27.74 kg/m    Physical Exam Vitals and nursing note reviewed.  Constitutional:      Appearance: She is well-developed and well-nourished.  HENT:     Head: Normocephalic and atraumatic.  Eyes:     Extraocular Movements: EOM normal.     Conjunctiva/sclera: Conjunctivae normal.  Cardiovascular:     Rate and Rhythm: Normal rate and regular rhythm.     Pulses: Intact distal pulses.     Heart sounds: Normal heart sounds.  Pulmonary:     Effort: Pulmonary effort is normal.     Breath sounds: Normal breath sounds.  Abdominal:     General: Bowel sounds are normal.     Palpations: Abdomen is soft.  Musculoskeletal:     Cervical back: Normal range of motion.  Lymphadenopathy:     Cervical: No cervical adenopathy.  Skin:    General: Skin is warm and dry.     Capillary Refill: Capillary refill takes less than 2 seconds.  Neurological:     Mental Status: She is alert and oriented to person, place, and time.  Psychiatric:        Mood and Affect: Mood and affect normal.        Behavior: Behavior normal.      Musculoskeletal Exam: C-spine thoracic and lumbar spine with good range of motion.  Shoulder joints, elbow joints, wrist joints with good range of motion.  She had had discomfort in making a complete fist with the right hand.  Left hand had no discomfort.  She  had tenderness on palpation of her MCPs and PIPs as described below but no synovitis was noted.  Hip joints, knee joints, ankles, MTPs and PIPs with good range of motion with no synovitis.  CDAI Exam: CDAI Score: 5.8  Patient Global: 5 mm; Provider Global: 3 mm Swollen: 0 ; Tender: 5  Joint Exam 10/07/2020      Right  Left  MCP 1   Tender     MCP 2   Tender     MCP 3   Tender     PIP 2   Tender     PIP 3   Tender        Investigation: No additional findings.  Imaging: No results found.  Recent Labs: Lab Results  Component Value Date   WBC 5.4 08/30/2020   HGB 13.0 08/30/2020   PLT 281 08/30/2020   NA 142 08/30/2020   K 4.0 08/30/2020   CL 108 08/30/2020   CO2 28 08/30/2020   GLUCOSE 77 08/30/2020  BUN 16 08/30/2020   CREATININE 0.88 08/30/2020   BILITOT 0.3 08/30/2020   ALKPHOS 72 03/04/2017   AST 12 08/30/2020   ALT 10 08/30/2020   PROT 7.2 08/30/2020   ALBUMIN 4.6 03/04/2017   CALCIUM 9.3 08/30/2020   GFRAA 87 08/30/2020   QFTBGOLDPLUS NEGATIVE 08/30/2020    Speciality Comments: No specialty comments available.  Procedures:  No procedures performed Allergies: Clindamycin/lincomycin and Elemental sulfur   Assessment / Plan:     Visit Diagnoses: Rheumatoid arthritis involving multiple sites with positive rheumatoid factor ([place]) -she has been experiencing increased pain and discomfort in her right hand and difficulty making a fist.  She also has decreased grip strength.  No synovitis was noted.  But she had tenderness over her MCPs and PIP joints.  Plan: XR Foot 2 Views Right, XR Foot 2 Views Left.  X-rays obtained today did not show any radiographic progression.  She has been having ongoing pain and discomfort we discussed decreasing the Humira subcu injection frequency to every 14 days.  She will continue on the current dose of methotrexate.  She was in agreement.  Pain in both hands -she has been experiencing increased pain and discomfort in her right hand  and difficulty making a fist.  No synovitis was noted.  She had tenderness over MCPs and PIPs.  Plan: XR Hand 2 View Right, XR Hand 2 View Left.  X-rays obtained today did not show any radiographic progression.  High risk medication use - Humira 40 mg sq injections every 21 days, methotrexate 2.5 mg 8 tablets every 7 days, folic acid 1 mg 2 tablets daily.  Labs from August 30, 2020 were normal.  She will advised to get labs every 3 months.  TB gold August 22, 2020 was negative.  Increased risk  of heart disease with rheumatoid arthritis was also emphasized.  Heart healthy diet and exercise was emphasized.  Handout was placed in the AVS.  Primary osteoarthritis of both feet-she denies any discomfort currently.  History of hypothyroidism-she is on Synthroid.  History of migraine-she is on Topamax and Imitrex.  Primary insomnia-she continues to have some insomnia.  Good sleep hygiene was discussed.  She takes trazodone for insomnia.   History of anxiety-she is on Elavil and Effexor.  Former smoker  Educated about COVID-19 virus infection-she is fully vaccinated against COVID-19.  She also received a third dose.  She has been advised to get a fourth dose (booster) 6 months after the third dose per recommendations.  Use of mask, social distancing and hand hygiene was discussed.  Orders: Orders Placed This Encounter  Procedures  . XR Hand 2 View Right  . XR Hand 2 View Left  . XR Foot 2 Views Right  . XR Foot 2 Views Left   No orders of the defined types were placed in this encounter.    Follow-Up Instructions: Return in about 5 months (around 03/09/2021) for Rheumatoid arthritis.   Bo Merino, MD  Note - This record has been created using Editor, commissioning.  Chart creation errors have been sought, but may not always  have been located. Such creation errors do not reflect on  the standard of medical care.

## 2020-10-07 ENCOUNTER — Encounter: Payer: Self-pay | Admitting: Rheumatology

## 2020-10-07 ENCOUNTER — Ambulatory Visit: Payer: Self-pay

## 2020-10-07 ENCOUNTER — Ambulatory Visit: Payer: 59 | Admitting: Rheumatology

## 2020-10-07 ENCOUNTER — Other Ambulatory Visit: Payer: Self-pay

## 2020-10-07 VITALS — BP 101/73 | HR 83 | Resp 14 | Ht 64.0 in | Wt 161.6 lb

## 2020-10-07 DIAGNOSIS — M0579 Rheumatoid arthritis with rheumatoid factor of multiple sites without organ or systems involvement: Secondary | ICD-10-CM

## 2020-10-07 DIAGNOSIS — Z8659 Personal history of other mental and behavioral disorders: Secondary | ICD-10-CM

## 2020-10-07 DIAGNOSIS — F5101 Primary insomnia: Secondary | ICD-10-CM | POA: Diagnosis not present

## 2020-10-07 DIAGNOSIS — M79641 Pain in right hand: Secondary | ICD-10-CM | POA: Diagnosis not present

## 2020-10-07 DIAGNOSIS — M79642 Pain in left hand: Secondary | ICD-10-CM | POA: Diagnosis not present

## 2020-10-07 DIAGNOSIS — Z8639 Personal history of other endocrine, nutritional and metabolic disease: Secondary | ICD-10-CM | POA: Diagnosis not present

## 2020-10-07 DIAGNOSIS — M19071 Primary osteoarthritis, right ankle and foot: Secondary | ICD-10-CM

## 2020-10-07 DIAGNOSIS — Z7189 Other specified counseling: Secondary | ICD-10-CM

## 2020-10-07 DIAGNOSIS — Z8669 Personal history of other diseases of the nervous system and sense organs: Secondary | ICD-10-CM

## 2020-10-07 DIAGNOSIS — M19072 Primary osteoarthritis, left ankle and foot: Secondary | ICD-10-CM

## 2020-10-07 DIAGNOSIS — Z79899 Other long term (current) drug therapy: Secondary | ICD-10-CM | POA: Diagnosis not present

## 2020-10-07 DIAGNOSIS — Z87891 Personal history of nicotine dependence: Secondary | ICD-10-CM

## 2020-10-07 MED ORDER — HUMIRA PEN 40 MG/0.8ML ~~LOC~~ PNKT
40.0000 mg | PEN_INJECTOR | SUBCUTANEOUS | 0 refills | Status: DC
Start: 2020-10-07 — End: 2020-10-09

## 2020-10-07 NOTE — Telephone Encounter (Signed)
Humira dose change you advised today at patient's appointment. Please review "no print" prescription and sign. Thanks!  

## 2020-10-07 NOTE — Patient Instructions (Addendum)
Standing Labs We placed an order today for your standing lab work.   Please have your standing labs drawn in April and every 25months  If possible, please have your labs drawn 2 weeks prior to your appointment so that the provider can discuss your results at your appointment.  We have open lab daily Monday through Thursday from 1:30-4:30 PM and Friday from 1:30-4:00 PM at the office of Dr. Bo Merino, [facility].   Please be advised, all patients with office appointments requiring lab work will take precedents over walk-in lab work.  If possible, please come for your lab work on Monday and Friday afternoons, as you may experience shorter wait times. The office is located at [address], [place], [place], [place] [address] No appointment is necessary.   Labs are drawn by [facility]. Please bring your co-pay at the time of your lab draw.  You may receive a bill from [facility] for your lab work.  If you wish to have your labs drawn at another location, please call the office 24 hours in advance to send orders.  If you have any questions regarding directions or hours of operation,  please call [phone].   As a reminder, please drink plenty of water prior to coming for your lab work. Thanks!   Heart Disease Prevention   Your inflammatory disease increases your risk of heart disease which includes heart attack, stroke, atrial fibrillation (irregular heartbeats), high blood pressure, heart failure and atherosclerosis (plaque in the arteries).  It is important to reduce your risk by:   . Keep blood pressure, cholesterol, and blood sugar at healthy levels   . Smoking Cessation   . Maintain a healthy weight  o BMI 20-25   . Eat a healthy diet  o Plenty of fresh fruit, vegetables, and whole grains  o Limit saturated fats, foods high in sodium, and added sugars  o DASH and Mediterranean diet   . Increase physical activity  o Recommend moderate physically activity for  150 minutes per week/ 30 minutes a day for five days a week These can be broken up into three separate ten-minute sessions during the day.   . Reduce Stress  . Meditation, slow breathing exercises, yoga, coloring books  . Dental visits twice a year   COVID-19 vaccine recommendations:   COVID-19 vaccine is recommended for everyone (unless you are allergic to a vaccine component), even if you are on a medication that suppresses your immune system.   If you are on Methotrexate, Cellcept (mycophenolate), Rinvoq, Morrie Sheldon, and Olumiant- hold the medication for 1 week after each vaccine. Hold Methotrexate for 2 weeks after the single dose COVID-19 vaccine.   The recommendations are that the patient is on immunosuppressive agents should receive her 3 COVID vaccines 1 month apart and then a fourth dose (booster) 6 months from the third dose.  Do not take Tylenol or any anti-inflammatory medications (NSAIDs) 24 hours prior to the COVID-19 vaccination.   There is no direct evidence about the efficacy of the COVID-19 vaccine in individuals who are on medications that suppress the immune system.   Even if you are fully vaccinated, and you are on any medications that suppress your immune system, please continue to wear a mask, maintain at least six feet social distance and practice hand hygiene.   If you develop a COVID-19 infection, please contact your PCP or our office to determine if you need monoclonal antibody infusion.  The booster vaccine is now available for  immunocompromised patients.   Please see the following web sites for updated information.   [email or web address]

## 2020-10-09 ENCOUNTER — Other Ambulatory Visit: Payer: Self-pay | Admitting: Pharmacist

## 2020-10-09 ENCOUNTER — Other Ambulatory Visit (HOSPITAL_COMMUNITY): Payer: Self-pay | Admitting: Internal Medicine

## 2020-10-09 ENCOUNTER — Other Ambulatory Visit: Payer: Self-pay | Admitting: *Deleted

## 2020-10-09 ENCOUNTER — Other Ambulatory Visit: Payer: Self-pay | Admitting: Rheumatology

## 2020-10-09 DIAGNOSIS — M0579 Rheumatoid arthritis with rheumatoid factor of multiple sites without organ or systems involvement: Secondary | ICD-10-CM

## 2020-10-09 MED ORDER — HUMIRA PEN 40 MG/0.8ML ~~LOC~~ PNKT: 40.0000 mg | PEN_INJECTOR | SUBCUTANEOUS | 0 refills | Status: DC

## 2020-10-11 ENCOUNTER — Other Ambulatory Visit (HOSPITAL_BASED_OUTPATIENT_CLINIC_OR_DEPARTMENT_OTHER): Payer: Self-pay | Admitting: Nurse Practitioner

## 2020-10-11 DIAGNOSIS — Z79899 Other long term (current) drug therapy: Secondary | ICD-10-CM | POA: Diagnosis not present

## 2020-10-11 DIAGNOSIS — F5109 Other insomnia not due to a substance or known physiological condition: Secondary | ICD-10-CM | POA: Diagnosis not present

## 2020-10-11 DIAGNOSIS — F331 Major depressive disorder, recurrent, moderate: Secondary | ICD-10-CM | POA: Insufficient documentation

## 2020-10-11 DIAGNOSIS — E038 Other specified hypothyroidism: Secondary | ICD-10-CM | POA: Diagnosis not present

## 2020-10-11 DIAGNOSIS — F419 Anxiety disorder, unspecified: Secondary | ICD-10-CM | POA: Diagnosis not present

## 2020-10-11 DIAGNOSIS — N951 Menopausal and female climacteric states: Secondary | ICD-10-CM | POA: Diagnosis not present

## 2020-10-14 ENCOUNTER — Other Ambulatory Visit (HOSPITAL_BASED_OUTPATIENT_CLINIC_OR_DEPARTMENT_OTHER): Payer: Self-pay | Admitting: Nurse Practitioner

## 2020-10-29 ENCOUNTER — Other Ambulatory Visit (HOSPITAL_COMMUNITY): Payer: Self-pay

## 2020-11-07 ENCOUNTER — Other Ambulatory Visit (HOSPITAL_COMMUNITY): Payer: Self-pay

## 2020-11-07 MED FILL — Adalimumab Auto-injector Kit 40 MG/0.8ML: SUBCUTANEOUS | 28 days supply | Qty: 2 | Fill #0 | Status: AC

## 2020-11-08 ENCOUNTER — Other Ambulatory Visit (HOSPITAL_COMMUNITY): Payer: Self-pay

## 2020-11-11 DIAGNOSIS — R399 Unspecified symptoms and signs involving the genitourinary system: Secondary | ICD-10-CM | POA: Diagnosis not present

## 2020-11-13 ENCOUNTER — Other Ambulatory Visit (HOSPITAL_BASED_OUTPATIENT_CLINIC_OR_DEPARTMENT_OTHER): Payer: Self-pay

## 2020-11-13 MED FILL — Folic Acid Tab 1 MG: ORAL | 90 days supply | Qty: 180 | Fill #0 | Status: AC

## 2020-11-18 ENCOUNTER — Other Ambulatory Visit (HOSPITAL_COMMUNITY): Payer: Self-pay

## 2020-12-12 ENCOUNTER — Other Ambulatory Visit (HOSPITAL_BASED_OUTPATIENT_CLINIC_OR_DEPARTMENT_OTHER): Payer: Self-pay

## 2020-12-12 ENCOUNTER — Other Ambulatory Visit: Payer: Self-pay

## 2020-12-12 ENCOUNTER — Other Ambulatory Visit (HOSPITAL_COMMUNITY): Payer: Self-pay

## 2020-12-12 ENCOUNTER — Other Ambulatory Visit: Payer: Self-pay | Admitting: Rheumatology

## 2020-12-12 DIAGNOSIS — Z79899 Other long term (current) drug therapy: Secondary | ICD-10-CM

## 2020-12-12 MED ORDER — METHOTREXATE 2.5 MG PO TABS
ORAL_TABLET | ORAL | 0 refills | Status: DC
  Filled 2020-12-12: qty 96, 84d supply, fill #0

## 2020-12-12 MED FILL — Adalimumab Auto-injector Kit 40 MG/0.8ML: SUBCUTANEOUS | 28 days supply | Qty: 2 | Fill #1 | Status: AC

## 2020-12-12 NOTE — Telephone Encounter (Signed)
Next Visit: 03/10/2021  Last Visit: 10/07/2020  Last Fill: 09/18/2020  DX:   Rheumatoid arthritis involving multiple sites with positive rheumatoid factor   Current Dose per office note 10/07/2020, methotrexate 2.5 mg 8 tablets every 7 days  Labs: 08/30/2020, CBC and CMP WNL. TB gold negative.   I called patient for to have labs drawn, patient will come have labs drawn 12/12/2020.  Okay to refill MTX?

## 2020-12-13 LAB — COMPLETE METABOLIC PANEL WITH GFR
AG Ratio: 1.8 (calc) (ref 1.0–2.5)
ALT: 11 U/L (ref 6–29)
AST: 13 U/L (ref 10–35)
Albumin: 4.2 g/dL (ref 3.6–5.1)
Alkaline phosphatase (APISO): 84 U/L (ref 37–153)
BUN: 15 mg/dL (ref 7–25)
CO2: 25 mmol/L (ref 20–32)
Calcium: 8.7 mg/dL (ref 8.6–10.4)
Chloride: 108 mmol/L (ref 98–110)
Creat: 0.95 mg/dL (ref 0.50–1.05)
GFR, Est African American: 79 mL/min/{1.73_m2} (ref >=60)
GFR, Est Non African American: 68 mL/min/{1.73_m2} (ref >=60)
Globulin: 2.4 g/dL (calc) (ref 1.9–3.7)
Glucose, Bld: 91 mg/dL (ref 65–99)
Potassium: 4.1 mmol/L (ref 3.5–5.3)
Sodium: 139 mmol/L (ref 135–146)
Total Bilirubin: 0.2 mg/dL (ref 0.2–1.2)
Total Protein: 6.6 g/dL (ref 6.1–8.1)

## 2020-12-13 LAB — CBC WITH DIFFERENTIAL/PLATELET
Absolute Monocytes: 410 cells/uL (ref 200–950)
Basophils Absolute: 51 cells/uL (ref 0–200)
Basophils Relative: 0.9 %
Eosinophils Absolute: 80 cells/uL (ref 15–500)
Eosinophils Relative: 1.4 %
HCT: 38.1 % (ref 35.0–45.0)
Hemoglobin: 12.7 g/dL (ref 11.7–15.5)
Lymphs Abs: 2371 cells/uL (ref 850–3900)
MCH: 32.8 pg (ref 27.0–33.0)
MCHC: 33.3 g/dL (ref 32.0–36.0)
MCV: 98.4 fL (ref 80.0–100.0)
MPV: 10.4 fL (ref 7.5–12.5)
Monocytes Relative: 7.2 %
Neutro Abs: 2787 cells/uL (ref 1500–7800)
Neutrophils Relative %: 48.9 %
Platelets: 242 10*3/uL (ref 140–400)
RBC: 3.87 10*6/uL (ref 3.80–5.10)
RDW: 13 % (ref 11.0–15.0)
Total Lymphocyte: 41.6 %
WBC: 5.7 10*3/uL (ref 3.8–10.8)

## 2020-12-13 NOTE — Progress Notes (Signed)
CBC and CMP WNL

## 2020-12-17 ENCOUNTER — Other Ambulatory Visit (HOSPITAL_BASED_OUTPATIENT_CLINIC_OR_DEPARTMENT_OTHER): Payer: Self-pay

## 2020-12-17 DIAGNOSIS — R059 Cough, unspecified: Secondary | ICD-10-CM | POA: Diagnosis not present

## 2020-12-17 DIAGNOSIS — Z20822 Contact with and (suspected) exposure to covid-19: Secondary | ICD-10-CM | POA: Diagnosis not present

## 2020-12-17 DIAGNOSIS — R6889 Other general symptoms and signs: Secondary | ICD-10-CM | POA: Diagnosis not present

## 2020-12-17 DIAGNOSIS — R0981 Nasal congestion: Secondary | ICD-10-CM | POA: Diagnosis not present

## 2020-12-17 MED ORDER — OSELTAMIVIR PHOSPHATE 75 MG PO CAPS
ORAL_CAPSULE | ORAL | 0 refills | Status: DC
  Filled 2020-12-17: qty 10, 5d supply, fill #0

## 2020-12-17 MED FILL — Topiramate Tab 100 MG: ORAL | 90 days supply | Qty: 180 | Fill #0 | Status: AC

## 2020-12-21 ENCOUNTER — Other Ambulatory Visit (HOSPITAL_COMMUNITY): Payer: Self-pay

## 2020-12-22 ENCOUNTER — Other Ambulatory Visit (HOSPITAL_COMMUNITY): Payer: Self-pay

## 2020-12-23 ENCOUNTER — Other Ambulatory Visit (HOSPITAL_COMMUNITY): Payer: Self-pay

## 2021-01-14 ENCOUNTER — Other Ambulatory Visit (HOSPITAL_COMMUNITY): Payer: Self-pay

## 2021-01-15 HISTORY — PX: LIPOSUCTION: SHX10

## 2021-01-16 ENCOUNTER — Other Ambulatory Visit (HOSPITAL_COMMUNITY): Payer: Self-pay

## 2021-01-20 ENCOUNTER — Other Ambulatory Visit (HOSPITAL_BASED_OUTPATIENT_CLINIC_OR_DEPARTMENT_OTHER): Payer: Self-pay

## 2021-01-20 MED FILL — Venlafaxine HCl Cap ER 24HR 75 MG (Base Equivalent): ORAL | 90 days supply | Qty: 270 | Fill #0 | Status: AC

## 2021-01-20 MED FILL — Levothyroxine Sodium Tab 88 MCG: ORAL | 90 days supply | Qty: 90 | Fill #0 | Status: AC

## 2021-01-20 MED FILL — Trazodone HCl Tab 50 MG: ORAL | 90 days supply | Qty: 90 | Fill #0 | Status: AC

## 2021-01-29 ENCOUNTER — Other Ambulatory Visit: Payer: Self-pay | Admitting: Rheumatology

## 2021-01-29 ENCOUNTER — Other Ambulatory Visit (HOSPITAL_COMMUNITY): Payer: Self-pay

## 2021-01-29 MED ORDER — HUMIRA PEN 40 MG/0.8ML ~~LOC~~ PNKT
40.0000 mg | PEN_INJECTOR | SUBCUTANEOUS | 0 refills | Status: DC
  Filled 2021-02-10: qty 2, 28d supply, fill #0

## 2021-01-29 NOTE — Telephone Encounter (Signed)
Next Visit: 03/10/2021   Last Visit: 10/07/2020   Last Fill: 10/09/2020  DX:   Rheumatoid arthritis involving multiple sites with positive rheumatoid factor    Current Dose per office note 10/07/2020, Humira 40 mg sq injections every 21 days. She has been having ongoing pain and discomfort we discussed decreasing the Humira subcu injection frequency to every 14 days.  Labs: 12/12/2020 CBC and CMP WNL  TB Gold: 08/30/2020 Neg   Okay to refill Humira?

## 2021-02-10 ENCOUNTER — Other Ambulatory Visit (HOSPITAL_COMMUNITY): Payer: Self-pay

## 2021-02-10 ENCOUNTER — Other Ambulatory Visit: Payer: Self-pay | Admitting: Pharmacist

## 2021-02-10 MED ORDER — HUMIRA PEN 40 MG/0.8ML ~~LOC~~ PNKT
40.0000 mg | PEN_INJECTOR | SUBCUTANEOUS | 0 refills | Status: DC
  Filled 2021-02-10: qty 2, 28d supply, fill #0
  Filled 2021-03-06: qty 2, 28d supply, fill #1
  Filled 2021-04-03: qty 2, 28d supply, fill #2

## 2021-02-19 DIAGNOSIS — Z135 Encounter for screening for eye and ear disorders: Secondary | ICD-10-CM | POA: Diagnosis not present

## 2021-02-19 DIAGNOSIS — H524 Presbyopia: Secondary | ICD-10-CM | POA: Diagnosis not present

## 2021-02-24 NOTE — Progress Notes (Signed)
Office Visit Note  Patient: Courtney Leonard             Date of Birth: 1965/11/14           MRN: [id number]             PCP: Deborah Chalk, FNP Referring: Deborah Chalk, FNP Visit Date: 03/10/2021 Occupation: '@GUAROCC'$ @  Subjective:  Medication monitoring   History of Present Illness: Courtney Leonard is a 55 y.o. female with history of seropositive rheumatoid arthritis and osteoarthritis.  She is on Humira 40 mg sq injections every 14 days, methotrexate 8 tablets by mouth once weekly, and folic acid 2 mg daily.  She has not missed any doses recently.  She denies any recent rheumatoid arthritis flares.  She states that she has occasional aching in both hands and both feet but denies any joint swelling.  She is not experiencing any other joint pain or inflammation at this time.  She has not had any difficulty with ADLs.  She states that on Wednesday she started to have left-sided flank pain which increased in severity and led her to be evaluated in the ED.  She was diagnosed with a kidney stone and had a stent placed on Friday.  She has been taking hydrocodone as needed for pain relief.  She has yet to pass the kidney stone.  She is taking cefuroxime and flomax as prescribed.     Activities of Daily Living:  Patient reports morning stiffness for 30 minutes.   Patient Denies nocturnal pain.  Difficulty dressing/grooming: Denies Difficulty climbing stairs: Denies Difficulty getting out of chair: Denies Difficulty using hands for taps, buttons, cutlery, and/or writing: Denies  Review of Systems  Constitutional:  Positive for fatigue.  HENT:  Positive for mouth dryness. Negative for mouth sores and nose dryness.   Eyes:  Negative for pain, itching and dryness.  Respiratory:  Negative for shortness of breath and difficulty breathing.   Cardiovascular:  Negative for chest pain and palpitations.  Gastrointestinal:  Negative for blood in stool, constipation and diarrhea.  Endocrine:  Positive for increased urination.  Genitourinary:  Positive for difficulty urinating, painful urination and hematuria.  Musculoskeletal:  Positive for joint pain, joint pain and morning stiffness. Negative for joint swelling, myalgias, muscle tenderness and myalgias.  Skin:  Negative for color change, rash and redness.  Allergic/Immunologic: Negative for susceptible to infections.  Neurological:  Negative for dizziness, numbness, headaches and memory loss.  Hematological:  Positive for bruising/bleeding tendency.  Psychiatric/Behavioral:  Negative for confusion.    PMFS History:  Patient Active Problem List   Diagnosis Date Noted   Rheumatoid arthritis involving multiple sites with positive rheumatoid factor ([place]) 06/13/2016   High risk medication use 06/13/2016   Primary osteoarthritis of both feet 06/13/2016   Insomnia 06/13/2016   Acquired hypothyroidism 06/13/2016   Migraine without status migrainosus, not intractable 06/13/2016   Former smoker 06/13/2016   Anxiety 01/08/2011    Past Medical History:  Diagnosis Date   Anal fissure    Anxiety    Arthritis    Depression    Hypothyroidism    Insomnia    Migraine    Osteoarthritis    Rheumatoid arthritis ([place])    Thyroid disease     Family History  Problem Relation Age of Onset   Diabetes Mother    Hypertension Mother    Atrial fibrillation Father    Prostate cancer Father    Past Surgical History:  Procedure Laterality  Date   ABDOMINAL HYSTERECTOMY     CESAREAN SECTION     CYSTOSCOPY W/ URETERAL STENT PLACEMENT Left 03/07/2021   Procedure: CYSTOSCOPY WITH STENT REPLACEMENT, RETROGRADE;  Surgeon: Cleon Gustin, MD;  Location: [facility];  Service: Urology;  Laterality: Left;   HEMORRHOID SURGERY     KNEE SURGERY     RT knee   laproscopy     LIPOSUCTION  01/15/2021   & excess skin removal   MOUTH SURGERY     ROBOTIC ASSISTED SALPINGO OOPHERECTOMY Left 09/12/2014   Procedure: ROBOTIC ASSISTED Left SALPINGO  OOPHORECTOMY/Right Salpingectomy/Pelvic Washings;  Surgeon: Princess Bruins, MD;  Location: [facility];  Service: Gynecology;  Laterality: Left;   UTERINE STENT PLACEMENT  03/07/2021   Social History   Social History Narrative   Not on file   Immunization History  Administered Date(s) Administered   Hepatitis B 11/08/2009, 12/11/2009   Influenza,trivalent, recombinat, inj, PF 05/17/2006, 05/03/2013   Influenza-Unspecified 05/21/2017, 05/01/2018   PFIZER(Purple Top)SARS-COV-2 Vaccination 08/04/2019, 08/25/2019   Pneumococcal Polysaccharide-23 04/10/2016   Td 06/26/2003   Tdap 03/06/2010, 01/13/2016   Varicella 12/25/2009, 01/24/2010   Zoster Recombinat (Shingrix) 09/24/2018, 03/17/2019     Objective: Vital Signs: BP 114/76 (BP Location: Left Arm, Patient Position: Sitting, Cuff Size: Normal)   Pulse 66   Ht '5\' 4"'$  (1.626 m)   Wt 156 lb 6.4 oz (70.9 kg)   BMI 26.85 kg/m    Physical Exam Vitals and nursing note reviewed.  Constitutional:      Appearance: She is well-developed.  HENT:     Head: Normocephalic and atraumatic.  Eyes:     Conjunctiva/sclera: Conjunctivae normal.  Pulmonary:     Effort: Pulmonary effort is normal.  Abdominal:     Palpations: Abdomen is soft.  Musculoskeletal:     Cervical back: Normal range of motion.  Skin:    General: Skin is warm and dry.     Capillary Refill: Capillary refill takes less than 2 seconds.  Neurological:     Mental Status: She is alert and oriented to person, place, and time.  Psychiatric:        Behavior: Behavior normal.     Musculoskeletal Exam: C-spine has good ROM with no discomfort.  Shoulder joints, elbow joints, wrist joints, MCPs, PIPs, DIPs have good range of motion with no synovitis.  She is able to make a complete fist bilaterally.  Hip joints have good range of motion with no discomfort.  Knee joints have good range of motion with crepitus bilaterally.  No warmth or effusion noted.  Ankle joints have good range of  motion without tenderness or joint swelling.  CDAI Exam: CDAI Score: 1  Patient Global: 5 mm; Provider Global: 5 mm Swollen: 0 ; Tender: 0  Joint Exam 03/10/2021   No joint exam has been documented for this visit   There is currently no information documented on the homunculus. Go to the Rheumatology activity and complete the homunculus joint exam.  Investigation: No additional findings.  Imaging: DG C-Arm 1-60 Min-No Report  Result Date: 03/07/2021 Fluoroscopy was utilized by the requesting physician.  No radiographic interpretation.   CT Renal Stone Study  Result Date: 03/05/2021 CLINICAL DATA:  Abdominal pain and left flank pain starting this morning. Urinary tract infection EXAM: CT ABDOMEN AND PELVIS WITHOUT CONTRAST TECHNIQUE: Multidetector CT imaging of the abdomen and pelvis was performed following the standard protocol without IV contrast. COMPARISON:  Pelvic MRI from 08/16/2014 FINDINGS: Lower chest: Unremarkable Hepatobiliary: Unremarkable Pancreas:  Unremarkable Spleen: Unremarkable Adrenals/Urinary Tract: Both adrenal glands appear normal. There is mild left hydronephrosis and asymmetric left perirenal stranding with mild proximal left hydroureter extending down to a 4 mm proximal ureteral calculus just above the level of the iliac crests on image 38 series 2. The left ureter distal to this level is of normal caliber. There are 2 right kidney lower pole nonobstructive renal calculi, the larger measuring 0.4 cm in diameter. There are 5 left kidney lower pole nonobstructive renal calculi, largest measuring 0.3 cm in diameter. Adrenal glands unremarkable.  Urinary bladder unremarkable. Stomach/Bowel: Mild fatty prominence of the ileocecal valve. Normal appendix. No dilated bowel. Vascular/Lymphatic: Unremarkable Reproductive: Uterus and left ovary absent. Right ovary 3.2 by 2.9 cm. Other: No supplemental non-categorized findings. Musculoskeletal: Unremarkable IMPRESSION: 1. Mild left  hydronephrosis and hydroureter extending down to a 4 mm left proximal ureteral calculus just above the level of the iliac crests. 2. Bilateral nonobstructive nephrolithiasis. Electronically Signed   By: Van Clines M.D.   On: 03/05/2021 17:09    Recent Labs: Lab Results  Component Value Date   WBC 9.2 03/05/2021   HGB 13.2 03/05/2021   PLT 258 03/05/2021   NA 142 03/05/2021   K 3.7 03/05/2021   CL 110 03/05/2021   CO2 21 (L) 03/05/2021   GLUCOSE 104 (H) 03/05/2021   BUN 17 03/05/2021   CREATININE 1.20 (H) 03/05/2021   BILITOT 0.5 03/05/2021   ALKPHOS 85 03/05/2021   AST 20 03/05/2021   ALT 17 03/05/2021   PROT 7.6 03/05/2021   ALBUMIN 4.9 03/05/2021   CALCIUM 9.1 03/05/2021   GFRAA 79 12/12/2020   QFTBGOLDPLUS NEGATIVE 08/30/2020    Speciality Comments: No specialty comments available.  Procedures:  No procedures performed Allergies: Clindamycin/lincomycin and Elemental sulfur  2.   Assessment / Plan:     Visit Diagnoses: Rheumatoid arthritis involving multiple sites with positive rheumatoid factor ([place]): She has no joint tenderness or synovitis on examination today.  She is clinically doing well on Humira 40 mg subcutaneous injections every 14 days, methotrexate 8 tablets by mouth once weekly, folic acid 2 mg by mouth daily.  She experiences occasional aching in both hands and both feet but has not noticed any inflammation.  She is not experiencing any difficulty with ADLs.  She is not experiencing any other joint pain or joint swelling at this time.  She will remain on Humira, methotrexate, and folic acid as prescribed.  Refills of methotrexate and folic acid will be sent to the pharmacy.  She was advised to notify us if she develops increased joint pain or joint swelling.  She will follow-up in the office in 5 months.  High risk medication use - Humira 40 mg sq injections every 14 days, methotrexate 2.5 mg 8 tablets every 7 days, and folic acid 1 mg 2 tablets daily.  CBC  and CMP updated on 03/05/21.  She will be due to update lab work in November and every 3 months.  Standing orders for CBC and CMP were placed today.  TB gold negative 1/28/2 Advised to hold Humira and methotrexate if she develops signs or symptoms of an infection and to resume once the infection has completely cleared. Discussed the importance of yearly skin exams while on Humira due to the increased risk for nonmelanoma skin cancer.  Pain in both hands - X-rays obtained on 10/07/2020 did not show any radiographic progression when compared to x-rays from 09/13/2018.  X-ray findings were consistent with rheumatoid arthritis  and osteoarthritis.  No erosive changes were noted.  She experiences occasional aching in both hands but has no tenderness or synovitis on exam.  She was able to make a complete fist bilaterally.  Primary osteoarthritis of both feet: She experiences occasional aching sensation in her feet but has not noticed any joint swelling.  On examination today she has good range of motion of both ankle joints with no tenderness or synovitis.  Other medical conditions are listed as follows  History of migraine - She is prescribed Topamax and Imitrex.  History of hypothyroidism - She remains on Synthroid.  Primary insomnia: She takes amitriptyline at bedtime.  History of anxiety - She is prescribed Effexor and Ativan.   Former smoker  Orders: No orders of the defined types were placed in this encounter.  No orders of the defined types were placed in this encounter.    Follow-Up Instructions: Return in about 5 months (around 08/10/2021) for Rheumatoid arthritis, Osteoarthritis.   Ofilia Neas, PA-C  Note - This record has been created using Dragon software.  Chart creation errors have been sought, but may not always  have been located. Such creation errors do not reflect on  the standard of medical care.

## 2021-03-03 DIAGNOSIS — R399 Unspecified symptoms and signs involving the genitourinary system: Secondary | ICD-10-CM | POA: Diagnosis not present

## 2021-03-05 ENCOUNTER — Emergency Department (HOSPITAL_COMMUNITY)
Admission: EM | Admit: 2021-03-05 | Discharge: 2021-03-05 | Disposition: A | Discharge status: Home / Self Care | Payer: 59 | Attending: Emergency Medicine | Admitting: Emergency Medicine

## 2021-03-05 ENCOUNTER — Emergency Department (HOSPITAL_COMMUNITY): Payer: 59

## 2021-03-05 ENCOUNTER — Encounter (HOSPITAL_COMMUNITY): Payer: Self-pay

## 2021-03-05 ENCOUNTER — Other Ambulatory Visit: Payer: Self-pay

## 2021-03-05 DIAGNOSIS — E039 Hypothyroidism, unspecified: Secondary | ICD-10-CM | POA: Diagnosis not present

## 2021-03-05 DIAGNOSIS — R109 Unspecified abdominal pain: Secondary | ICD-10-CM | POA: Diagnosis present

## 2021-03-05 DIAGNOSIS — Z87440 Personal history of urinary (tract) infections: Secondary | ICD-10-CM | POA: Diagnosis not present

## 2021-03-05 DIAGNOSIS — Z79899 Other long term (current) drug therapy: Secondary | ICD-10-CM | POA: Diagnosis not present

## 2021-03-05 DIAGNOSIS — N132 Hydronephrosis with renal and ureteral calculous obstruction: Secondary | ICD-10-CM | POA: Insufficient documentation

## 2021-03-05 DIAGNOSIS — Z87891 Personal history of nicotine dependence: Secondary | ICD-10-CM | POA: Diagnosis not present

## 2021-03-05 DIAGNOSIS — N200 Calculus of kidney: Secondary | ICD-10-CM | POA: Diagnosis not present

## 2021-03-05 DIAGNOSIS — Z90710 Acquired absence of both cervix and uterus: Secondary | ICD-10-CM | POA: Diagnosis not present

## 2021-03-05 LAB — CBC WITH DIFFERENTIAL/PLATELET
Abs Immature Granulocytes: 0.02 10*3/uL (ref 0.00–0.07)
Basophils Absolute: 0 10*3/uL (ref 0.0–0.1)
Basophils Relative: 0 %
Eosinophils Absolute: 0 10*3/uL (ref 0.0–0.5)
Eosinophils Relative: 0 %
HCT: 39 % (ref 36.0–46.0)
Hemoglobin: 13.2 g/dL (ref 12.0–15.0)
Immature Granulocytes: 0 %
Lymphocytes Relative: 19 %
Lymphs Abs: 1.7 10*3/uL (ref 0.7–4.0)
MCH: 33.9 pg (ref 26.0–34.0)
MCHC: 33.8 g/dL (ref 30.0–36.0)
MCV: 100.3 fL — ABNORMAL HIGH (ref 80.0–100.0)
Monocytes Absolute: 0.6 10*3/uL (ref 0.1–1.0)
Monocytes Relative: 7 %
Neutro Abs: 6.8 10*3/uL (ref 1.7–7.7)
Neutrophils Relative %: 74 %
Platelets: 258 10*3/uL (ref 150–400)
RBC: 3.89 MIL/uL (ref 3.87–5.11)
RDW: 12.4 % (ref 11.5–15.5)
WBC: 9.2 10*3/uL (ref 4.0–10.5)
nRBC: 0 % (ref 0.0–0.2)

## 2021-03-05 LAB — LIPASE, BLOOD: Lipase: 39 U/L (ref 11–51)

## 2021-03-05 LAB — COMPREHENSIVE METABOLIC PANEL
ALT: 17 U/L (ref 0–44)
AST: 20 U/L (ref 15–41)
Albumin: 4.9 g/dL (ref 3.5–5.0)
Alkaline Phosphatase: 85 U/L (ref 38–126)
Anion gap: 11 (ref 5–15)
BUN: 17 mg/dL (ref 6–20)
CO2: 21 mmol/L — ABNORMAL LOW (ref 22–32)
Calcium: 9.1 mg/dL (ref 8.9–10.3)
Chloride: 110 mmol/L (ref 98–111)
Creatinine, Ser: 1.2 mg/dL — ABNORMAL HIGH (ref 0.44–1.00)
GFR, Estimated: 54 mL/min — ABNORMAL LOW (ref >60)
Glucose, Bld: 104 mg/dL — ABNORMAL HIGH (ref 70–99)
Potassium: 3.7 mmol/L (ref 3.5–5.1)
Sodium: 142 mmol/L (ref 135–145)
Total Bilirubin: 0.5 mg/dL (ref 0.3–1.2)
Total Protein: 7.6 g/dL (ref 6.5–8.1)

## 2021-03-05 LAB — URINALYSIS, ROUTINE W REFLEX MICROSCOPIC
Bacteria, UA: NONE SEEN
Bilirubin Urine: NEGATIVE
Glucose, UA: NEGATIVE mg/dL
Ketones, ur: NEGATIVE mg/dL
Nitrite: NEGATIVE
Protein, ur: NEGATIVE mg/dL
RBC / HPF: >50 RBC/hpf — ABNORMAL HIGH (ref 0–5)
Specific Gravity, Urine: 1.019 (ref 1.005–1.030)
pH: 7 (ref 5.0–8.0)

## 2021-03-05 MED ORDER — HYDROMORPHONE HCL 1 MG/ML IJ SOLN
1.0000 mg | Freq: Once | INTRAMUSCULAR | Status: AC
Start: 2021-03-05 — End: 2021-03-05
  Administered 2021-03-05: 1 mg via INTRAVENOUS
  Filled 2021-03-05: qty 1

## 2021-03-05 MED ORDER — HYDROCODONE-ACETAMINOPHEN 5-325 MG PO TABS: 1.0000 | ORAL_TABLET | Freq: Four times a day (QID) | ORAL | 0 refills | Status: DC | PRN

## 2021-03-05 MED ORDER — KETOROLAC TROMETHAMINE 30 MG/ML IJ SOLN
15.0000 mg | Freq: Once | INTRAMUSCULAR | Status: AC
  Administered 2021-03-05: 15 mg via INTRAVENOUS
  Filled 2021-03-05: qty 1

## 2021-03-05 MED ORDER — SODIUM CHLORIDE 0.9 % IV BOLUS
1000.0000 mL | Freq: Once | INTRAVENOUS | Status: AC
  Administered 2021-03-05: 1000 mL via INTRAVENOUS

## 2021-03-05 NOTE — ED Triage Notes (Signed)
Pt reports flank and abdominal pain, and nausea that began yesterday.

## 2021-03-05 NOTE — ED Provider Notes (Signed)
[facility] DEPT Provider Note   CSN: [id number] Arrival date & time: 03/05/21  1610     History Chief Complaint  Patient presents with   Flank Pain    ANAM HANIF is a 55 y.o. female.  HPI Patient presents with left flank pain, nausea. Pain began about 1 week ago, was mild, with associated symptoms that were consistent for urinary tract infection to the patient.  She saw her physician was started on Macrobid.  Over the past day, however, she has developed new flank pain that was not previously present.  No fever, no diarrhea.  There is persistent nausea, no improvement with OTC medication.  She was initially alone, but is eventually joined by her husband.    Past Medical History:  Diagnosis Date   Anal fissure    Anxiety    Arthritis    Depression    Hypothyroidism    Insomnia    Migraine    Osteoarthritis    Rheumatoid arthritis ([place])    Thyroid disease     Patient Active Problem List   Diagnosis Date Noted   Rheumatoid arthritis involving multiple sites with positive rheumatoid factor ([place]) 06/13/2016   High risk medication use 06/13/2016   Primary osteoarthritis of both feet 06/13/2016   Insomnia 06/13/2016   Acquired hypothyroidism 06/13/2016   Migraine without status migrainosus, not intractable 06/13/2016   Former smoker 06/13/2016   Anxiety 01/08/2011    Past Surgical History:  Procedure Laterality Date   ABDOMINAL HYSTERECTOMY     CESAREAN SECTION     HEMORRHOID SURGERY     KNEE SURGERY     RT knee   laproscopy     MOUTH SURGERY     ROBOTIC ASSISTED SALPINGO OOPHERECTOMY Left 09/12/2014   Procedure: ROBOTIC ASSISTED Left SALPINGO OOPHORECTOMY/Right Salpingectomy/Pelvic Washings;  Surgeon: Princess Bruins, MD;  Location: [facility];  Service: Gynecology;  Laterality: Left;     OB History   No obstetric history on file.     Family History  Problem Relation Age of Onset   Diabetes Mother    Hypertension Mother     Atrial fibrillation Father    Prostate cancer Father     Social History   Tobacco Use   Smoking status: Former    Packs/day: 0.10    Years: 5.00    Pack years: 0.50    Types: Cigarettes    Quit date: 06/28/2001    Years since quitting: 19.6   Smokeless tobacco: Never  Vaping Use   Vaping Use: Never used  Substance Use Topics   Alcohol use: No   Drug use: No    Home Medications Prior to Admission medications   Medication Sig Start Date End Date Taking? Authorizing Provider  acetaminophen (TYLENOL) 500 MG tablet Take 1,000 mg by mouth every 6 (six) hours as needed for moderate pain.   Yes [provider]  Adalimumab (HUMIRA PEN) 40 MG/0.8ML PNKT INJECT 40 MG INTO THE SKIN EVERY 14 (FOURTEEN) DAYS. 02/10/21 02/10/22 Yes Jegede, Marlena Clipper, MD  amitriptyline (ELAVIL) 25 MG tablet TAKE 1 TABLET (25 MG TOTAL) BY MOUTH NIGHTLY. Patient taking differently: Take 25 mg by mouth at bedtime. 04/02/20 04/02/21 Yes Celene Squibb., MD  bisacodyl (DULCOLAX) 5 MG EC tablet Take 5 mg by mouth at bedtime.   Yes [provider]  Cranberry 1000 MG CAPS Take 1,000 mg by mouth 2 (two) times daily.   Yes [provider]  diclofenac sodium (VOLTAREN) 1 %  GEL Apply 2 g topically daily as needed (pain). 04/26/18  Yes [provider]  folic acid (FOLVITE) 1 MG tablet TAKE 2 TABLETS BY MOUTH DAILY Patient taking differently: Take 2 mg by mouth daily. 04/26/20 04/26/21 Yes Deveshwar, Abel Presto, MD  levothyroxine (SYNTHROID) 88 MCG tablet TAKE 1 TABLET BY MOUTH ONCE DAILY *NEED OFFICE VISIT PRIOR TO NEXT REFILL* Patient taking differently: Take 88 mcg by mouth daily before breakfast. 10/14/20 10/14/21 Yes Hunter, Megan A, FNP  LORazepam (ATIVAN) 0.5 MG tablet TAKE 1 TABLET (0.5 MG TOTAL) BY MOUTH 2 (TWO) TIMES DAILY AS NEEDED FOR ANXIETY. Patient taking differently: Take 0.5 mg by mouth 2 (two) times daily as needed for anxiety. 10/14/20 04/12/21 Yes Hunter, Megan A, FNP   methotrexate (RHEUMATREX) 2.5 MG tablet TAKE 8 TABLETS BY MOUTH ONCE A WEEK Patient taking differently: Take 20 mg by mouth once a week. Friday 09/18/20  Yes Ofilia Neas, PA-C  nitrofurantoin, macrocrystal-monohydrate, (MACROBID) 100 MG capsule Take 100 mg by mouth 2 (two) times daily. Start date : 03/03/21 03/03/21 03/10/21 Yes [provider]  oxyCODONE (OXY IR/ROXICODONE) 5 MG immediate release tablet Take 5 mg by mouth every 4 (four) hours as needed for severe pain. 01/14/21  Yes [provider]  SUMAtriptan (IMITREX) 50 MG tablet Take 50 mg by mouth every 2 (two) hours as needed for migraine or headache. May repeat in 2 hours if headache persists or recurs.   Yes [provider]  topiramate (TOPAMAX) 100 MG tablet TAKE 1 TABLET BY MOUTH TWICE DAILY. *PLEASE SCHEDULE FOLLOW UP APPT* Patient taking differently: Take 200 mg by mouth daily. 04/02/20 04/02/21 Yes Celene Squibb., MD  traZODone (DESYREL) 50 MG tablet TAKE 1/2 - 1 TABLET BY MOUTH EVERY NIGHT FOR SLEEP Patient taking differently: Take 50 mg by mouth at bedtime. 10/11/20 10/11/21 Yes Hunter, Megan A, FNP  venlafaxine XR (EFFEXOR-XR) 75 MG 24 hr capsule Take 225 mg by mouth daily. 11/21/19  Yes [provider]  amoxicillin (AMOXIL) 500 MG capsule TAKE 1 CAPSULE BY MOUTH THREE TIMES DAILY Patient not taking: No sig reported 05/03/20 05/03/21  Mallory Shirk, DMD  cephALEXin (KEFLEX) 500 MG capsule TAKE 1 CAPSULE (500 MG TOTAL) BY MOUTH 4 (FOUR) TIMES DAILY FOR 10 DAYS. Patient not taking: No sig reported 07/23/20 07/23/21  Melynda Ripple, MD  chlorhexidine (PERIDEX) 0.12 % solution TAKE 15MLS BY MOUTH TWO TIMES DAILY AFTER BRUSHING TEETH. SWISH IN MOUTH FOR 30 SECOND THE SPIT OUT **ONLY USE FOR 2 - 5 DAYS** Patient not taking: No sig reported 05/03/20 05/03/21  Mallory Shirk, DMD  folic acid (FOLVITE) 1 MG tablet TAKE 2 TABLETS BY MOUTH DAILY Patient not taking: No sig reported 04/26/20   Bo Merino, MD  levothyroxine (SYNTHROID) 88 MCG tablet TAKE 1 TABLET BY MOUTH ONCE DAILY Patient not taking: No sig reported 09/19/20 09/19/21  Deborah Chalk, FNP  levothyroxine (SYNTHROID) 88 MCG tablet TAKE 1 TABLET BY MOUTH ONCE DAILY **NEEDS OFFICE VISIT FOR FURTHER REFILLS** Patient not taking: Reported on 03/05/2021 01/26/20 01/25/21  Earnie Larsson, PA-C  methotrexate (RHEUMATREX) 2.5 MG tablet TAKE 8 TABLETS BY MOUTH ONCE A WEEK Patient not taking: No sig reported 12/12/20 12/12/21  Ofilia Neas, PA-C  methotrexate 2.5 MG tablet TAKE 8 TABLETS BY MOUTH ONCE A WEEK Patient not taking: No sig reported 09/18/20 09/18/21  Ofilia Neas, PA-C  ondansetron (ZOFRAN-ODT) 4 MG disintegrating tablet TAKE 1 TABLET BY MOUTH AND PLACE ON TO OF  THE TONGUE TO DISSOLVE EVERY 8 HOURS AS NEEDED FOR NAUSEA & VOMITING Patient not taking: No sig reported 05/03/20 05/03/21  Mallory Shirk, DMD  oseltamivir (TAMIFLU) 75 MG capsule Take 1 capsule (75 mg total) by mouth 2 times daily for 5 days. Patient not taking: No sig reported 12/17/20     phenazopyridine (PYRIDIUM) 200 MG tablet TAKE 1 TABLET (200 MG TOTAL) BY MOUTH 3 (THREE) TIMES DAILY AS NEEDED FOR PAIN. Patient not taking: No sig reported 07/23/20 07/23/21  Melynda Ripple, MD  traZODone (DESYREL) 50 MG tablet TAKE 1/2 TABLET BY MOUTH EVERY NIGHT AT BEDTIME Patient not taking: Reported on 03/05/2021 01/26/20 01/25/21  Earnie Larsson, PA-C  venlafaxine XR (EFFEXOR-XR) 75 MG 24 hr capsule TAKE 3 CAPSULES BY MOUTH ONCE DAILY Patient not taking: No sig reported 10/11/20 10/11/21  Deborah Chalk, FNP  venlafaxine XR (EFFEXOR-XR) 75 MG 24 hr capsule TAKE 1 CAPSULE BY MOUTH 3 TIMES DAILY Patient not taking: No sig reported 09/19/20 09/19/21  Deborah Chalk, FNP  venlafaxine XR (EFFEXOR-XR) 75 MG 24 hr capsule TAKE 1 CAPSULE BY MOUTH THREE TIMES DAILY Patient not taking: Reported on 03/05/2021 02/22/20 02/21/21  Earnie Larsson, PA-C    Allergies     Clindamycin/lincomycin and Elemental sulfur  Review of Systems   Review of Systems  Constitutional:        Per HPI, otherwise negative  HENT:         Per HPI, otherwise negative  Respiratory:         Per HPI, otherwise negative  Cardiovascular:        Per HPI, otherwise negative  Gastrointestinal:  Positive for nausea. Negative for vomiting.  Endocrine:       Negative aside from HPI  Genitourinary:        Neg aside from HPI   Musculoskeletal:        Per HPI, otherwise negative  Skin: Negative.   Neurological:  Negative for syncope.   Physical Exam Updated Vital Signs BP 114/74   Pulse 73   Temp 98.4 F (36.9 C) (Oral)   Resp 18   SpO2 97%   Physical Exam Vitals and nursing note reviewed.  Constitutional:      General: She is not in acute distress.    Appearance: She is well-developed.  HENT:     Head: Normocephalic and atraumatic.  Eyes:     Conjunctiva/sclera: Conjunctivae normal.  Cardiovascular:     Rate and Rhythm: Normal rate and regular rhythm.  Pulmonary:     Effort: Pulmonary effort is normal. No respiratory distress.     Breath sounds: Normal breath sounds. No stridor.  Abdominal:     General: There is no distension.     Tenderness: There is left CVA tenderness.  Skin:    General: Skin is warm and dry.  Neurological:     Mental Status: She is alert and oriented to person, place, and time.     Cranial Nerves: No cranial nerve deficit.    ED Results / Procedures / Treatments   Labs (all labs ordered are listed, but only abnormal results are displayed) Labs Reviewed  CBC WITH DIFFERENTIAL/PLATELET - Abnormal; Notable for the following components:      Result Value   MCV 100.3 (*)    All other components within normal limits  COMPREHENSIVE METABOLIC PANEL - Abnormal; Notable for the following components:   CO2 21 (*)    Glucose, Bld 104 (*)  Creatinine, Ser 1.20 (*)    GFR, Estimated 54 (*)    All other components within normal limits   URINALYSIS, ROUTINE W REFLEX MICROSCOPIC - Abnormal; Notable for the following components:   APPearance TURBID (*)    Hgb urine dipstick MODERATE (*)    Leukocytes,Ua TRACE (*)    RBC / HPF >50 (*)    All other components within normal limits  URINE CULTURE  LIPASE, BLOOD    EKG None  Radiology CT Renal Stone Study  Result Date: 03/05/2021 CLINICAL DATA:  Abdominal pain and left flank pain starting this morning. Urinary tract infection EXAM: CT ABDOMEN AND PELVIS WITHOUT CONTRAST TECHNIQUE: Multidetector CT imaging of the abdomen and pelvis was performed following the standard protocol without IV contrast. COMPARISON:  Pelvic MRI from 08/16/2014 FINDINGS: Lower chest: Unremarkable Hepatobiliary: Unremarkable Pancreas: Unremarkable Spleen: Unremarkable Adrenals/Urinary Tract: Both adrenal glands appear normal. There is mild left hydronephrosis and asymmetric left perirenal stranding with mild proximal left hydroureter extending down to a 4 mm proximal ureteral calculus just above the level of the iliac crests on image 38 series 2. The left ureter distal to this level is of normal caliber. There are 2 right kidney lower pole nonobstructive renal calculi, the larger measuring 0.4 cm in diameter. There are 5 left kidney lower pole nonobstructive renal calculi, largest measuring 0.3 cm in diameter. Adrenal glands unremarkable.  Urinary bladder unremarkable. Stomach/Bowel: Mild fatty prominence of the ileocecal valve. Normal appendix. No dilated bowel. Vascular/Lymphatic: Unremarkable Reproductive: Uterus and left ovary absent. Right ovary 3.2 by 2.9 cm. Other: No supplemental non-categorized findings. Musculoskeletal: Unremarkable IMPRESSION: 1. Mild left hydronephrosis and hydroureter extending down to a 4 mm left proximal ureteral calculus just above the level of the iliac crests. 2. Bilateral nonobstructive nephrolithiasis. Electronically Signed   By: Van Clines M.D.   On: 03/05/2021 17:09     Procedures Procedures   Medications Ordered in ED Medications  HYDROmorphone (DILAUDID) injection 1 mg (has no administration in time range)  sodium chloride 0.9 % bolus 1,000 mL (1,000 mLs Intravenous New Bag/Given 03/05/21 1958)  ketorolac (TORADOL) 30 MG/ML injection 15 mg (15 mg Intravenous Given 03/05/21 1954)    ED Course  I have reviewed the triage vital signs and the nursing notes.  Pertinent labs & imaging results that were available during my care of the patient were reviewed by me and considered in my medical decision making (see chart for details).   9:18 PM Patient continues to complain of pain despite initial fluids, Toradol.  I reviewed the CT, I discussed with her and her husband.  Mild left hydronephrosis, with 4 mm left proximal ureteral calculus.  No evidence for concurrent infection.  Patient improved.  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  She is awake, alert, afebrile, hemodynamically unremarkable, no evidence for obstruction, as above, patient eventually had substantial pain control was appropriate for discharge with close outpatient follow-up. MDM Rules/Calculators/A&P MDM Number of Diagnoses or Management Options Nephrolithiasis: new, needed workup   Amount and/or Complexity of Data Reviewed Clinical lab tests: ordered and reviewed Tests in the radiology section of CPT: ordered and reviewed Tests in the medicine section of CPT: reviewed and ordered Decide to obtain previous medical records or to obtain history from someone other than the patient: yes Obtain history from someone other than the patient: yes Review and summarize past medical records: yes  Independent visualization of images, tracings, or specimens: yes  Risk of Complications, Morbidity, and/or Mortality Presenting problems: high Diagnostic procedures:  high Management options: high  Critical Care Total time providing critical care: < 30 minutes  Patient Progress Patient progress: improved   Final Clinical Impression(s) / ED Diagnoses Final diagnoses:  Nephrolithiasis     Carmin Muskrat, MD 03/05/21 2236

## 2021-03-05 NOTE — Discharge Instructions (Addendum)
As discussed, your evaluation today has been largely reassuring.  But, it is important that you monitor your condition carefully, and do not hesitate to return to the ED if you develop new, or concerning changes in your condition.  Is use the prescribed pain medication as needed.  In addition use ibuprofen, 400 mg, 3 times daily for the next 3 days, taken with food for additional pain control.  Otherwise, please follow-up with your physician for appropriate ongoing care.

## 2021-03-05 NOTE — ED Provider Notes (Signed)
Emergency Medicine Provider Triage Evaluation Note  Courtney Leonard , a 55 y.o. female  was evaluated in triage.  Pt complains of abd pain and left flank pain that started this am. Has been on macrobid  Review of Systems  Positive: Left flank pain, abd pain, dysuria, frequency Negative: fever  Physical Exam  There were no vitals taken for this visit. Gen:   Awake, no distress   Resp:  Normal effort  MSK:   Moves extremities without difficulty  Other:  Abd pain, left cva ttp  Medical Decision Making  Medically screening exam initiated at 4:19 PM.  Appropriate orders placed.  ANNISSA REIS was informed that the remainder of the evaluation will be completed by another provider, this initial triage assessment does not replace that evaluation, and the importance of remaining in the ED until their evaluation is complete.    Rodney Booze, PA-C 03/05/21 1621    Regan Lemming, MD 03/06/21 1051

## 2021-03-06 ENCOUNTER — Other Ambulatory Visit (HOSPITAL_COMMUNITY): Payer: Self-pay

## 2021-03-07 ENCOUNTER — Other Ambulatory Visit: Payer: Self-pay | Admitting: Urology

## 2021-03-07 ENCOUNTER — Inpatient Hospital Stay (HOSPITAL_COMMUNITY): Payer: 59

## 2021-03-07 ENCOUNTER — Ambulatory Visit (HOSPITAL_COMMUNITY)
Admission: AD | Admit: 2021-03-07 | Discharge: 2021-03-07 | Disposition: A | Discharge status: Home / Self Care | Payer: 59 | Source: Ambulatory Visit | Attending: Urology | Admitting: Urology

## 2021-03-07 ENCOUNTER — Inpatient Hospital Stay (HOSPITAL_COMMUNITY): Payer: 59 | Admitting: Anesthesiology

## 2021-03-07 ENCOUNTER — Encounter (HOSPITAL_COMMUNITY): Payer: Self-pay | Admitting: Urology

## 2021-03-07 ENCOUNTER — Encounter (HOSPITAL_COMMUNITY)
Admission: AD | Disposition: A | Discharge status: Home / Self Care | Payer: Self-pay | Source: Ambulatory Visit | Attending: Urology

## 2021-03-07 DIAGNOSIS — Z8249 Family history of ischemic heart disease and other diseases of the circulatory system: Secondary | ICD-10-CM | POA: Diagnosis not present

## 2021-03-07 DIAGNOSIS — F418 Other specified anxiety disorders: Secondary | ICD-10-CM | POA: Diagnosis not present

## 2021-03-07 DIAGNOSIS — Z98891 History of uterine scar from previous surgery: Secondary | ICD-10-CM | POA: Diagnosis not present

## 2021-03-07 DIAGNOSIS — E039 Hypothyroidism, unspecified: Secondary | ICD-10-CM | POA: Diagnosis not present

## 2021-03-07 DIAGNOSIS — Z90710 Acquired absence of both cervix and uterus: Secondary | ICD-10-CM | POA: Insufficient documentation

## 2021-03-07 DIAGNOSIS — Z882 Allergy status to sulfonamides status: Secondary | ICD-10-CM | POA: Diagnosis not present

## 2021-03-07 DIAGNOSIS — N132 Hydronephrosis with renal and ureteral calculous obstruction: Secondary | ICD-10-CM | POA: Insufficient documentation

## 2021-03-07 DIAGNOSIS — Z881 Allergy status to other antibiotic agents status: Secondary | ICD-10-CM | POA: Diagnosis not present

## 2021-03-07 DIAGNOSIS — G4700 Insomnia, unspecified: Secondary | ICD-10-CM | POA: Diagnosis not present

## 2021-03-07 DIAGNOSIS — N201 Calculus of ureter: Secondary | ICD-10-CM | POA: Diagnosis not present

## 2021-03-07 DIAGNOSIS — Z90722 Acquired absence of ovaries, bilateral: Secondary | ICD-10-CM | POA: Diagnosis not present

## 2021-03-07 DIAGNOSIS — Z87891 Personal history of nicotine dependence: Secondary | ICD-10-CM | POA: Insufficient documentation

## 2021-03-07 DIAGNOSIS — N200 Calculus of kidney: Secondary | ICD-10-CM

## 2021-03-07 HISTORY — PX: UTERINE STENT PLACEMENT: SHX6655

## 2021-03-07 HISTORY — PX: CYSTOSCOPY W/ URETERAL STENT PLACEMENT: SHX1429

## 2021-03-07 SURGERY — CYSTOSCOPY, FLEXIBLE, WITH STENT REPLACEMENT
Anesthesia: General | Site: Ureter | Laterality: Left

## 2021-03-07 MED ORDER — FENTANYL CITRATE (PF) 100 MCG/2ML IJ SOLN
INTRAMUSCULAR | Status: AC
  Administered 2021-03-07: 50 ug via INTRAVENOUS
  Filled 2021-03-07: qty 2

## 2021-03-07 MED ORDER — TAMSULOSIN HCL 0.4 MG PO CAPS: 0.4000 mg | ORAL_CAPSULE | Freq: Every day | ORAL | 0 refills | Status: DC

## 2021-03-07 MED ORDER — MEPERIDINE HCL 50 MG/ML IJ SOLN: 6.2500 mg | INTRAMUSCULAR | Status: DC | PRN

## 2021-03-07 MED ORDER — LACTATED RINGERS IV SOLN: INTRAVENOUS | Status: DC

## 2021-03-07 MED ORDER — ACETAMINOPHEN 325 MG PO TABS: 325.0000 mg | ORAL_TABLET | ORAL | Status: DC | PRN

## 2021-03-07 MED ORDER — ONDANSETRON HCL 4 MG PO TABS: 4.0000 mg | ORAL_TABLET | Freq: Every day | ORAL | 1 refills | Status: DC | PRN

## 2021-03-07 MED ORDER — MIDAZOLAM HCL 5 MG/5ML IJ SOLN
INTRAMUSCULAR | Status: DC | PRN
  Administered 2021-03-07: 2 mg via INTRAVENOUS

## 2021-03-07 MED ORDER — FENTANYL CITRATE (PF) 100 MCG/2ML IJ SOLN
25.0000 ug | INTRAMUSCULAR | Status: DC | PRN
  Administered 2021-03-07: 50 ug via INTRAVENOUS

## 2021-03-07 MED ORDER — LIDOCAINE 2% (20 MG/ML) 5 ML SYRINGE
INTRAMUSCULAR | Status: AC
  Filled 2021-03-07: qty 5

## 2021-03-07 MED ORDER — FENTANYL CITRATE (PF) 100 MCG/2ML IJ SOLN
50.0000 ug | Freq: Once | INTRAMUSCULAR | Status: AC
  Administered 2021-03-07: 50 ug via INTRAVENOUS

## 2021-03-07 MED ORDER — DEXAMETHASONE SODIUM PHOSPHATE 10 MG/ML IJ SOLN
INTRAMUSCULAR | Status: DC | PRN
  Administered 2021-03-07: 5 mg via INTRAVENOUS

## 2021-03-07 MED ORDER — SODIUM CHLORIDE 0.9 % IR SOLN
Status: DC | PRN
  Administered 2021-03-07: 3000 mL via INTRAVESICAL

## 2021-03-07 MED ORDER — OXYCODONE HCL 5 MG PO TABS
5.0000 mg | ORAL_TABLET | Freq: Once | ORAL | Status: AC | PRN
Start: 2021-03-07 — End: 2021-03-07

## 2021-03-07 MED ORDER — KETOROLAC TROMETHAMINE 30 MG/ML IJ SOLN: 30.0000 mg | Freq: Once | INTRAMUSCULAR | Status: AC

## 2021-03-07 MED ORDER — ACETAMINOPHEN 160 MG/5ML PO SOLN: 325.0000 mg | ORAL | Status: DC | PRN

## 2021-03-07 MED ORDER — ONDANSETRON HCL 4 MG/2ML IJ SOLN
INTRAMUSCULAR | Status: DC | PRN
  Administered 2021-03-07: 4 mg via INTRAVENOUS

## 2021-03-07 MED ORDER — ONDANSETRON HCL 4 MG/2ML IJ SOLN: 4.0000 mg | Freq: Once | INTRAMUSCULAR | Status: DC | PRN

## 2021-03-07 MED ORDER — CHLORHEXIDINE GLUCONATE 0.12 % MT SOLN
15.0000 mL | OROMUCOSAL | Status: AC
  Administered 2021-03-07: 15 mL via OROMUCOSAL

## 2021-03-07 MED ORDER — FENTANYL CITRATE (PF) 250 MCG/5ML IJ SOLN
INTRAMUSCULAR | Status: AC
  Filled 2021-03-07: qty 5

## 2021-03-07 MED ORDER — OXYCODONE HCL 5 MG PO TABS
ORAL_TABLET | ORAL | Status: AC
  Administered 2021-03-07: 5 mg via ORAL
  Filled 2021-03-07: qty 1

## 2021-03-07 MED ORDER — IOHEXOL 300 MG/ML  SOLN
INTRAMUSCULAR | Status: DC | PRN
  Administered 2021-03-07: 10 mL

## 2021-03-07 MED ORDER — PROPOFOL 10 MG/ML IV BOLUS
INTRAVENOUS | Status: DC | PRN
  Administered 2021-03-07: 150 mg via INTRAVENOUS

## 2021-03-07 MED ORDER — KETOROLAC TROMETHAMINE 30 MG/ML IJ SOLN
INTRAMUSCULAR | Status: AC
  Administered 2021-03-07: 30 mg via INTRAVENOUS
  Filled 2021-03-07: qty 1

## 2021-03-07 MED ORDER — FENTANYL CITRATE (PF) 100 MCG/2ML IJ SOLN
INTRAMUSCULAR | Status: DC | PRN
  Administered 2021-03-07: 50 ug via INTRAVENOUS

## 2021-03-07 MED ORDER — LIDOCAINE 2% (20 MG/ML) 5 ML SYRINGE
INTRAMUSCULAR | Status: DC | PRN
  Administered 2021-03-07: 60 mg via INTRAVENOUS

## 2021-03-07 MED ORDER — PROPOFOL 10 MG/ML IV BOLUS
INTRAVENOUS | Status: AC
  Filled 2021-03-07: qty 20

## 2021-03-07 MED ORDER — MIDAZOLAM HCL 2 MG/2ML IJ SOLN
INTRAMUSCULAR | Status: AC
  Filled 2021-03-07: qty 2

## 2021-03-07 MED ORDER — OXYCODONE HCL 5 MG/5ML PO SOLN
5.0000 mg | Freq: Once | ORAL | Status: AC | PRN
Start: 2021-03-07 — End: 2021-03-07

## 2021-03-07 MED ORDER — CEFUROXIME AXETIL 500 MG PO TABS: 500.0000 mg | ORAL_TABLET | Freq: Two times a day (BID) | ORAL | 0 refills | Status: DC

## 2021-03-07 MED ORDER — SODIUM CHLORIDE 0.9 % IV SOLN
2.0000 g | Freq: Once | INTRAVENOUS | Status: AC
  Administered 2021-03-07: 2 g via INTRAVENOUS
  Filled 2021-03-07: qty 20

## 2021-03-07 MED ORDER — OXYCODONE-ACETAMINOPHEN 5-325 MG PO TABS: 1.0000 | ORAL_TABLET | ORAL | 0 refills | Status: DC | PRN

## 2021-03-07 SURGICAL SUPPLY — 20 items
BAG URO CATCHER STRL LF (MISCELLANEOUS) ×2 IMPLANT
CATH INTERMIT  6FR 70CM (CATHETERS) ×2 IMPLANT
CLOTH BEACON ORANGE TIMEOUT ST (SAFETY) ×2 IMPLANT
EXTRACTOR STONE NITINOL NGAGE (UROLOGICAL SUPPLIES) IMPLANT
GLOVE SURG ENC MOIS LTX SZ8 (GLOVE) ×2 IMPLANT
GOWN STRL REUS W/TWL XL LVL3 (GOWN DISPOSABLE) ×2 IMPLANT
GUIDEWIRE ANG ZIPWIRE 038X150 (WIRE) ×2 IMPLANT
GUIDEWIRE STR DUAL SENSOR (WIRE) ×1 IMPLANT
IV NS 1000ML (IV SOLUTION) ×2
IV NS 1000ML BAXH (IV SOLUTION) ×1 IMPLANT
KIT TURNOVER KIT A (KITS) ×2 IMPLANT
MANIFOLD NEPTUNE II (INSTRUMENTS) ×2 IMPLANT
PACK CYSTO (CUSTOM PROCEDURE TRAY) ×2 IMPLANT
SHEATH URETERAL 12FRX35CM (MISCELLANEOUS) IMPLANT
STENT URET 6FRX26 CONTOUR (STENTS) ×1 IMPLANT
TRACTIP FLEXIVA PULS ID 200XHI (Laser) IMPLANT
TRACTIP FLEXIVA PULSE ID 200 (Laser)
TUBE FEEDING 8FR 16IN STR KANG (MISCELLANEOUS) IMPLANT
TUBING CONNECTING 10 (TUBING) ×2 IMPLANT
TUBING UROLOGY SET (TUBING) ×1 IMPLANT

## 2021-03-07 NOTE — Op Note (Signed)
.  Preoperative diagnosis: Left ureteral stone  Postoperative diagnosis: Same  Procedure: 1 cystoscopy 2. Left retrograde pyelography 3.  Intraoperative fluoroscopy, under one hour, with interpretation 4. Left 6 x 26 JJ stent placement  Attending: Nicolette Bang  Anesthesia: General  Estimated blood loss: None  Drains: Left 6 x 26 JJ ureteral stent without tether  Specimens: none  Antibiotics: rocephin  Findings: left proximal ureteral stone. Moderate hydronephrosis. No masses/lesions in the bladder. Ureteral orifices in normal anatomic location.  Indications: Patient is a 55 year old female with a history of left ureteral stone and UTI.  After discussing treatment options, they decided proceed with left stent placement.  Procedure her in detail: The patient was brought to the operating room and a brief timeout was done to ensure correct patient, correct procedure, correct site.  General anesthesia was administered patient was placed in dorsal lithotomy position.  Their genitalia was then prepped and draped in usual sterile fashion.  A rigid 84 French cystoscope was passed in the urethra and the bladder.  Bladder was inspected free masses or lesions.  the ureteral orifices were in the normal orthotopic locations.  a 6 french ureteral catheter was then instilled into the left ureteral orifice.  a gentle retrograde was obtained and findings noted above.  we then placed a zip wire through the ureteral catheter and advanced up to the renal pelvis.    We then placed a 6 x 26 double-j ureteral stent over the original zip wire.  We then removed the wire and good coil was noted in the the renal pelvis under fluoroscopy and the bladder under direct vision.  A foley catheter was then placed. the bladder was then drained and this concluded the procedure which was well tolerated by patient.  Complications: None  Condition: Stable, extubated, transferred to PACU  Plan: Patient is to be discharged  home and followup in 10-14 days for ureteroscopic stone extraction

## 2021-03-07 NOTE — Anesthesia Preprocedure Evaluation (Signed)
Anesthesia Evaluation  Patient identified by MRN, date of birth, ID band Patient awake    Reviewed: Allergy & Precautions, NPO status , Patient's Chart, lab work & pertinent test results  Airway Mallampati: II  TM Distance: >3 FB Neck ROM: Full    Dental no notable dental hx. (+) Teeth Intact   Pulmonary former smoker,    Pulmonary exam normal breath sounds clear to auscultation       Cardiovascular negative cardio ROS Normal cardiovascular exam Rhythm:Regular Rate:Normal     Neuro/Psych  Headaches, PSYCHIATRIC DISORDERS Anxiety Depression    GI/Hepatic negative GI ROS, Neg liver ROS,   Endo/Other  Hypothyroidism   Renal/GU negative Renal ROS  negative genitourinary   Musculoskeletal  (+) Arthritis , Osteoarthritis,    Abdominal   Peds  Hematology negative hematology ROS (+)   Anesthesia Other Findings   Reproductive/Obstetrics Complex left ovarian cyst                             Anesthesia Physical  Anesthesia Plan  ASA: 2 and emergent  Anesthesia Plan: General   Post-op Pain Management:    Induction: Intravenous  PONV Risk Score and Plan: 3 and Ondansetron, Dexamethasone and Treatment may vary due to age or medical condition  Airway Management Planned: Oral ETT and LMA  Additional Equipment: None  Intra-op Plan:   Post-operative Plan: Extubation in OR  Informed Consent: I have reviewed the patients History and Physical, chart, labs and discussed the procedure including the risks, benefits and alternatives for the proposed anesthesia with the patient or authorized representative who has indicated his/her understanding and acceptance.     Dental advisory given  Plan Discussed with: Anesthesiologist, CRNA and Surgeon  Anesthesia Plan Comments:         Anesthesia Quick Evaluation

## 2021-03-07 NOTE — H&P (Signed)
Urology Admission H&P  Chief Complaint: left flank pain  History of Present Illness: Courtney Leonard is a 55yo here for left ureteral stent placement. Courtney Leonard developed left flank pain 3-4 days ago and was initially treated for a UTI. Her flank pain worsened and her dysuria worsened and Courtney Leonard presented to our office at [facility] today. UA was concerning for infection and Courtney Leonard had intractable left flank pain. No other associated symtpoms  Past Medical History:  Diagnosis Date   Anal fissure    Anxiety    Arthritis    Depression    Hypothyroidism    Insomnia    Migraine    Osteoarthritis    Rheumatoid arthritis ([place])    Thyroid disease    Past Surgical History:  Procedure Laterality Date   ABDOMINAL HYSTERECTOMY     CESAREAN SECTION     HEMORRHOID SURGERY     KNEE SURGERY     RT knee   laproscopy     MOUTH SURGERY     ROBOTIC ASSISTED SALPINGO OOPHERECTOMY Left 09/12/2014   Procedure: ROBOTIC ASSISTED Left SALPINGO OOPHORECTOMY/Right Salpingectomy/Pelvic Washings;  Surgeon: Princess Bruins, MD;  Location: [facility];  Service: Gynecology;  Laterality: Left;    Home Medications:  Current Facility-Administered Medications  Medication Dose Route Frequency Provider Last Rate Last Admin   cefTRIAXone (ROCEPHIN) 2 g in sodium chloride 0.9 % 100 mL IVPB  2 g Intravenous Once Jarad Barth, Candee Furbish, MD       Allergies:  Allergies  Allergen Reactions   Clindamycin/Lincomycin    Elemental Sulfur     Family History  Problem Relation Age of Onset   Diabetes Mother    Hypertension Mother    Atrial fibrillation Father    Prostate cancer Father    Social History:  reports that Courtney Leonard quit smoking about 19 years ago. Her smoking use included cigarettes. Courtney Leonard has a 0.50 pack-year smoking history. Courtney Leonard has never used smokeless tobacco. Courtney Leonard reports that Courtney Leonard does not drink alcohol and does not use drugs.  Review of Systems  Genitourinary:  Positive for dysuria and flank pain.  All other systems  reviewed and are negative.  Physical Exam:  Vital signs in last 24 hours:   Physical Exam Vitals reviewed.  Constitutional:      Appearance: Normal appearance.  HENT:     Head: Normocephalic and atraumatic.     Nose: Nose normal. No congestion.     Mouth/Throat:     Mouth: Mucous membranes are moist.  Eyes:     Extraocular Movements: Extraocular movements intact.     Pupils: Pupils are equal, round, and reactive to light.  Cardiovascular:     Rate and Rhythm: Normal rate and regular rhythm.  Pulmonary:     Effort: Pulmonary effort is normal. No respiratory distress.  Abdominal:     General: Abdomen is flat. There is no distension.  Musculoskeletal:        General: No swelling. Normal range of motion.     Cervical back: Normal range of motion and neck supple.  Skin:    General: Skin is warm and dry.  Neurological:     General: No focal deficit present.     Mental Status: Courtney Leonard is alert and oriented to person, place, and time.  Psychiatric:        Mood and Affect: Mood normal.        Behavior: Behavior normal.        Thought Content: Thought content normal.  Judgment: Judgment normal.    Laboratory Data:  No results found for this or any previous visit (from the past 24 hour(s)). No results found for this or any previous visit (from the past 240 hour(s)). Creatinine: Recent Labs    03/05/21 1647  CREATININE 1.20*   Baseline Creatinine: 1  Impression/Assessment:  55yo with left ureteral stone, UTI  Plan:  -We discussed the management of kidney stones. These options include observation, ureteroscopy, shockwave lithotripsy (ESWL) and percutaneous nephrolithotomy (PCNL). We discussed which options are relevant to the patient's stone(s). We discussed the natural history of kidney stones as well as the complications of untreated stones and the impact on quality of life without treatment as well as with each of the above listed treatments. We also discussed the  efficacy of each treatment in its ability to clear the stone burden. With any of these management options I discussed the signs and symptoms of infection and the need for emergent treatment should these be experienced. For each option we discussed the ability of each procedure to clear the patient of their stone burden.   For observation I described the risks which include but are not limited to silent renal damage, life-threatening infection, need for emergent surgery, failure to pass stone and pain.   For ureteroscopy I described the risks which include bleeding, infection, damage to contiguous structures, positioning injury, ureteral stricture, ureteral avulsion, ureteral injury, need for prolonged ureteral stent, inability to perform ureteroscopy, need for an interval procedure, inability to clear stone burden, stent discomfort/pain, heart attack, stroke, pulmonary embolus and the inherent risks with general anesthesia.   For shockwave lithotripsy I described the risks which include arrhythmia, kidney contusion, kidney hemorrhage, need for transfusion, pain, inability to adequately break up stone, inability to pass stone fragments, Steinstrasse, infection associated with obstructing stones, need for alternate surgical procedure, need for repeat shockwave lithotripsy, MI, CVA, PE and the inherent risks with anesthesia/conscious sedation.   For PCNL I described the risks including positioning injury, pneumothorax, hydrothorax, need for chest tube, inability to clear stone burden, renal laceration, arterial venous fistula or malformation, need for embolization of kidney, loss of kidney or renal function, need for repeat procedure, need for prolonged nephrostomy tube, ureteral avulsion, MI, CVA, PE and the inherent risks of general anesthesia.   - The patient would like to proceed with Left ureteral stent placment  Nicolette Bang 03/07/2021, 4:06 PM

## 2021-03-07 NOTE — Transfer of Care (Signed)
Immediate Anesthesia Transfer of Care Note  Patient: Courtney Leonard  Procedure(s) Performed: CYSTOSCOPY WITH STENT REPLACEMENT, RETROGRADE (Left: Ureter)  Patient Location: PACU  Anesthesia Type:General  Level of Consciousness: awake, drowsy and responds to stimulation  Airway & Oxygen Therapy: Patient Spontanous Breathing and Patient connected to face mask oxygen  Post-op Assessment: Report given to RN and Post -op Vital signs reviewed and stable  Post vital signs: Reviewed and stable  Last Vitals:  Vitals Value Taken Time  BP    Temp    Pulse    Resp 16 03/07/21 1839  SpO2    Vitals shown include unvalidated device data.  Last Pain:  Vitals:   03/07/21 1626  TempSrc: Oral         Complications: No notable events documented.

## 2021-03-07 NOTE — Anesthesia Procedure Notes (Signed)
Procedure Name: LMA Insertion Date/Time: 03/07/2021 6:12 PM Performed by: Lollie Sails, CRNA Pre-anesthesia Checklist: Patient identified, Emergency Drugs available, Suction available, Patient being monitored and Timeout performed Patient Re-evaluated:Patient Re-evaluated prior to induction Oxygen Delivery Method: Circle system utilized Preoxygenation: Pre-oxygenation with 100% oxygen Induction Type: IV induction Ventilation: Mask ventilation without difficulty LMA: LMA inserted LMA Size: 4.0 Number of attempts: 1 Placement Confirmation: positive ETCO2 and breath sounds checked- equal and bilateral Tube secured with: Tape Dental Injury: Teeth and Oropharynx as per pre-operative assessment

## 2021-03-08 NOTE — Anesthesia Postprocedure Evaluation (Signed)
Anesthesia Post Note  Patient: Courtney Leonard  Procedure(s) Performed: CYSTOSCOPY WITH STENT REPLACEMENT, RETROGRADE (Left: Ureter)     Patient location during evaluation: PACU Anesthesia Type: General Level of consciousness: awake and alert Pain management: pain level controlled Vital Signs Assessment: post-procedure vital signs reviewed and stable Respiratory status: spontaneous breathing, nonlabored ventilation, respiratory function stable and patient connected to nasal cannula oxygen Cardiovascular status: blood pressure returned to baseline and stable Postop Assessment: no apparent nausea or vomiting Anesthetic complications: no   No notable events documented.  Last Vitals:  Vitals:   03/07/21 2015 03/07/21 2030  BP: 103/73 112/73  Pulse: 68 68  Resp: 13 14  Temp: 36.6 C 36.6 C  SpO2: 100% 100%    Last Pain:  Vitals:   03/07/21 2030  TempSrc:   PainSc: 2                  Duward Allbritton

## 2021-03-09 ENCOUNTER — Encounter (HOSPITAL_COMMUNITY): Payer: Self-pay | Admitting: Urology

## 2021-03-10 ENCOUNTER — Other Ambulatory Visit (HOSPITAL_BASED_OUTPATIENT_CLINIC_OR_DEPARTMENT_OTHER): Payer: Self-pay

## 2021-03-10 ENCOUNTER — Other Ambulatory Visit: Payer: Self-pay

## 2021-03-10 ENCOUNTER — Other Ambulatory Visit: Payer: Self-pay | Admitting: Physician Assistant

## 2021-03-10 ENCOUNTER — Other Ambulatory Visit: Payer: Self-pay | Admitting: Rheumatology

## 2021-03-10 ENCOUNTER — Encounter: Payer: Self-pay | Admitting: Physician Assistant

## 2021-03-10 ENCOUNTER — Other Ambulatory Visit (HOSPITAL_COMMUNITY): Payer: Self-pay

## 2021-03-10 ENCOUNTER — Ambulatory Visit: Payer: 59 | Admitting: Physician Assistant

## 2021-03-10 VITALS — BP 114/76 | HR 66 | Ht 64.0 in | Wt 156.4 lb

## 2021-03-10 DIAGNOSIS — M0579 Rheumatoid arthritis with rheumatoid factor of multiple sites without organ or systems involvement: Secondary | ICD-10-CM | POA: Diagnosis not present

## 2021-03-10 DIAGNOSIS — M19071 Primary osteoarthritis, right ankle and foot: Secondary | ICD-10-CM

## 2021-03-10 DIAGNOSIS — M79641 Pain in right hand: Secondary | ICD-10-CM | POA: Diagnosis not present

## 2021-03-10 DIAGNOSIS — Z8659 Personal history of other mental and behavioral disorders: Secondary | ICD-10-CM | POA: Diagnosis not present

## 2021-03-10 DIAGNOSIS — Z87891 Personal history of nicotine dependence: Secondary | ICD-10-CM

## 2021-03-10 DIAGNOSIS — Z8639 Personal history of other endocrine, nutritional and metabolic disease: Secondary | ICD-10-CM | POA: Diagnosis not present

## 2021-03-10 DIAGNOSIS — F5101 Primary insomnia: Secondary | ICD-10-CM | POA: Diagnosis not present

## 2021-03-10 DIAGNOSIS — Z8669 Personal history of other diseases of the nervous system and sense organs: Secondary | ICD-10-CM

## 2021-03-10 DIAGNOSIS — M79642 Pain in left hand: Secondary | ICD-10-CM

## 2021-03-10 DIAGNOSIS — Z79899 Other long term (current) drug therapy: Secondary | ICD-10-CM | POA: Diagnosis not present

## 2021-03-10 DIAGNOSIS — M19072 Primary osteoarthritis, left ankle and foot: Secondary | ICD-10-CM

## 2021-03-10 MED ORDER — FOLIC ACID 1 MG PO TABS
2.0000 mg | ORAL_TABLET | Freq: Every day | ORAL | 2 refills | Status: DC
  Filled 2021-03-10: qty 180, 90d supply, fill #0
  Filled 2021-06-10: qty 180, 90d supply, fill #1
  Filled 2021-09-15: qty 180, 90d supply, fill #2

## 2021-03-10 MED ORDER — METHOTREXATE 2.5 MG PO TABS
ORAL_TABLET | ORAL | 0 refills | Status: DC
  Filled 2021-03-10: qty 96, 84d supply, fill #0

## 2021-03-10 MED ORDER — FOLIC ACID 1 MG PO TABS
ORAL_TABLET | Freq: Every day | ORAL | 3 refills | Status: DC
  Filled 2021-03-10: qty 180, 90d supply, fill #0

## 2021-03-10 NOTE — Telephone Encounter (Signed)
Next Visit: 09/10/2021   Last Visit: 03/10/2021   Dx: Rheumatoid arthritis involving multiple sites with positive rheumatoid factor   Current Dose per office note on 03/10/2021: methotrexate 2.5 mg 8 tablets every 7 days  Labs: 03/05/2021 MCV 100.3, CO2 21, Glucose 104, Creat 1.20, GFR 54  Okay to refill MTX?

## 2021-03-10 NOTE — Progress Notes (Unsigned)
Refills of methotrexate and folic acid requested today at patient's appointment. Please review and send to the pharmacy. Thanks!

## 2021-03-10 NOTE — Telephone Encounter (Signed)
Next Visit: 09/10/2021  Last Visit: 03/10/2021  Dx: Rheumatoid arthritis involving multiple sites with positive rheumatoid factor  Current Dose per office note on [id number]: folic acid 1 mg 2 tablets daily.  Per protocol, okay to refill per Dr. Estanislado Pandy

## 2021-03-10 NOTE — Patient Instructions (Signed)

## 2021-03-11 ENCOUNTER — Other Ambulatory Visit (HOSPITAL_BASED_OUTPATIENT_CLINIC_OR_DEPARTMENT_OTHER): Payer: Self-pay

## 2021-03-14 DIAGNOSIS — N202 Calculus of kidney with calculus of ureter: Secondary | ICD-10-CM | POA: Diagnosis not present

## 2021-03-14 DIAGNOSIS — N201 Calculus of ureter: Secondary | ICD-10-CM | POA: Diagnosis not present

## 2021-03-17 ENCOUNTER — Other Ambulatory Visit: Payer: Self-pay | Admitting: Urology

## 2021-03-17 NOTE — Patient Instructions (Signed)
DUE TO COVID-19 ONLY ONE VISITOR IS ALLOWED TO COME WITH YOU AND STAY IN THE WAITING ROOM ONLY DURING PRE OP AND PROCEDURE DAY OF SURGERY. THE 1 VISITOR  MAY VISIT WITH YOU AFTER SURGERY IN YOUR PRIVATE ROOM DURING VISITING HOURS ONLY!              Courtney Leonard    Your procedure is scheduled on: 03/19/21   Report to [facility] Main  Entrance   Report to admitting at  9:30 AM     Call this number if you have problems the morning of surgery [phone]    Remember: Do not eat food after Midnight.  You may have clear liquids until 8:30 am  BRUSH YOUR TEETH MORNING OF SURGERY AND RINSE YOUR MOUTH OUT, NO CHEWING GUM CANDY OR MINTS.     Take these medicines the morning of surgery with A SIP OF WATER: Topamax, Venlafaxine XR                                You may not have any metal on your body including hair pins and              piercings  Do not wear jewelry, make-up, lotions, powders or perfumes, deodorant             Do not wear nail polish on your fingernails.  Do not shave  48 hours prior to surgery.         Do not bring valuables to the hospital. [place].  Contacts, dentures or bridgework may not be worn into surgery.       Patients discharged the day of surgery will not be allowed to drive home.   IF YOU ARE HAVING SURGERY AND GOING HOME THE SAME DAY, YOU MUST HAVE AN ADULT TO DRIVE YOU HOME AND BE WITH YOU FOR 24 HOURS.   YOU MAY GO HOME BY TAXI OR UBER OR ORTHERWISE, BUT AN ADULT MUST ACCOMPANY YOU HOME AND STAY WITH YOU FOR 24 HOURS.  Name and phone number of your driver:  Special Instructions: N/A              Please read over the following fact sheets you were given: _____________________________________________________________________             [facility] - Preparing for Surgery Before surgery, you can play an important role.  Because skin is not sterile, your skin needs to be as free of germs  as possible.  You can reduce the number of germs on your skin by washing with CHG (chlorahexidine gluconate) soap before surgery.  CHG is an antiseptic cleaner which kills germs and bonds with the skin to continue killing germs even after washing. Please DO NOT use if you have an allergy to CHG or antibacterial soaps.  If your skin becomes reddened/irritated stop using the CHG and inform your nurse when you arrive at Short Stay. Do not shave (including legs and underarms) for at least 48 hours prior to the first CHG shower.   Please follow these instructions carefully:  1.  Shower with CHG Soap the night before surgery and the  morning of Surgery.  2.  If you choose to wash your hair, wash your hair first as usual with your  normal  shampoo.  3.  After you shampoo, rinse your hair and body thoroughly to remove the  shampoo.                                        4.  Use CHG as you would any other liquid soap.  You can apply chg directly  to the skin and wash                       Gently with a scrungie or clean washcloth.  5.  Apply the CHG Soap to your body ONLY FROM THE NECK DOWN.   Do not use on face/ open                           Wound or open sores. Avoid contact with eyes, ears mouth and genitals (private parts).                       Wash face,  Genitals (private parts) with your normal soap.             6.  Wash thoroughly, paying special attention to the area where your surgery  will be performed.  7.  Thoroughly rinse your body with warm water from the neck down.  8.  DO NOT shower/wash with your normal soap after using and rinsing off  the CHG Soap.             9.  Pat yourself dry with a clean towel.            10.  Wear clean pajamas.            11.  Place clean sheets on your bed the night of your first shower and do not  sleep with pets. Day of Surgery : Do not apply any lotions/deodorants the morning of surgery.  Please wear clean clothes to the hospital/surgery  center.  FAILURE TO FOLLOW THESE INSTRUCTIONS MAY RESULT IN THE CANCELLATION OF YOUR SURGERY PATIENT SIGNATURE_________________________________  NURSE SIGNATURE__________________________________  ________________________________________________________________________

## 2021-03-18 ENCOUNTER — Encounter (HOSPITAL_COMMUNITY): Payer: Self-pay

## 2021-03-18 ENCOUNTER — Encounter (HOSPITAL_COMMUNITY)
Admission: RE | Admit: 2021-03-18 | Discharge: 2021-03-18 | Disposition: A | Discharge status: Home / Self Care | Payer: 59 | Source: Ambulatory Visit

## 2021-03-18 ENCOUNTER — Other Ambulatory Visit: Payer: Self-pay

## 2021-03-18 NOTE — Progress Notes (Signed)
No COVID test needed  PCP - [facility] Rheumatologist- Dr. Bo Merino LOV 03/10/21   Chest x-ray - no EKG - no Stress Test - no ECHO - no Cardiac Cath - no Pacemaker/ICD device last checked:NA  Sleep Study - no CPAP -   Fasting Blood Sugar - NA Checks Blood Sugar _____ times a day  Blood Thinner Instructions:NA Aspirin Instructions: Last Dose:  Anesthesia review: no  Patient denies shortness of breath, fever, cough and chest pain at PAT appointment No SOB with activities.  Patient verbalized understanding of instructions that were given to them at the PAT appointment. Patient was also instructed that they will need to review over the PAT instructions again at home before surgery. Yes  PAT was phone interview. Instructions were read to Pt . Pt will shower with antibacterial soap at home.

## 2021-03-19 ENCOUNTER — Ambulatory Visit (HOSPITAL_COMMUNITY): Payer: 59 | Admitting: Anesthesiology

## 2021-03-19 ENCOUNTER — Encounter (HOSPITAL_COMMUNITY): Payer: Self-pay | Admitting: Urology

## 2021-03-19 ENCOUNTER — Ambulatory Visit (HOSPITAL_COMMUNITY)
Admission: RE | Admit: 2021-03-19 | Discharge: 2021-03-19 | Disposition: A | Discharge status: Home / Self Care | Payer: 59 | Source: Ambulatory Visit | Attending: Urology | Admitting: Urology

## 2021-03-19 ENCOUNTER — Encounter (HOSPITAL_COMMUNITY)
Admission: RE | Disposition: A | Discharge status: Home / Self Care | Payer: Self-pay | Source: Ambulatory Visit | Attending: Urology

## 2021-03-19 ENCOUNTER — Ambulatory Visit (HOSPITAL_COMMUNITY): Payer: 59

## 2021-03-19 DIAGNOSIS — Z8719 Personal history of other diseases of the digestive system: Secondary | ICD-10-CM | POA: Insufficient documentation

## 2021-03-19 DIAGNOSIS — Z882 Allergy status to sulfonamides status: Secondary | ICD-10-CM | POA: Insufficient documentation

## 2021-03-19 DIAGNOSIS — N132 Hydronephrosis with renal and ureteral calculous obstruction: Secondary | ICD-10-CM | POA: Diagnosis not present

## 2021-03-19 DIAGNOSIS — Z881 Allergy status to other antibiotic agents status: Secondary | ICD-10-CM | POA: Insufficient documentation

## 2021-03-19 DIAGNOSIS — F419 Anxiety disorder, unspecified: Secondary | ICD-10-CM | POA: Diagnosis not present

## 2021-03-19 DIAGNOSIS — E039 Hypothyroidism, unspecified: Secondary | ICD-10-CM | POA: Insufficient documentation

## 2021-03-19 DIAGNOSIS — N202 Calculus of kidney with calculus of ureter: Secondary | ICD-10-CM | POA: Diagnosis not present

## 2021-03-19 DIAGNOSIS — Z98891 History of uterine scar from previous surgery: Secondary | ICD-10-CM | POA: Diagnosis not present

## 2021-03-19 DIAGNOSIS — G43909 Migraine, unspecified, not intractable, without status migrainosus: Secondary | ICD-10-CM | POA: Diagnosis not present

## 2021-03-19 HISTORY — PX: CYSTOSCOPY WITH URETEROSCOPY AND STENT PLACEMENT: SHX6377

## 2021-03-19 SURGERY — CYSTOURETEROSCOPY, WITH STENT INSERTION
Anesthesia: General | Laterality: Left

## 2021-03-19 MED ORDER — DEXAMETHASONE SODIUM PHOSPHATE 10 MG/ML IJ SOLN
INTRAMUSCULAR | Status: DC | PRN
  Administered 2021-03-19: 10 mg via INTRAVENOUS

## 2021-03-19 MED ORDER — MIDAZOLAM HCL 5 MG/5ML IJ SOLN
INTRAMUSCULAR | Status: DC | PRN
  Administered 2021-03-19: 2 mg via INTRAVENOUS

## 2021-03-19 MED ORDER — ORAL CARE MOUTH RINSE: 15.0000 mL | Freq: Once | OROMUCOSAL | Status: AC

## 2021-03-19 MED ORDER — LIDOCAINE 2% (20 MG/ML) 5 ML SYRINGE
INTRAMUSCULAR | Status: AC
  Filled 2021-03-19: qty 5

## 2021-03-19 MED ORDER — LIDOCAINE 2% (20 MG/ML) 5 ML SYRINGE
INTRAMUSCULAR | Status: DC | PRN
  Administered 2021-03-19: 60 mg via INTRAVENOUS

## 2021-03-19 MED ORDER — ONDANSETRON HCL 4 MG/2ML IJ SOLN
INTRAMUSCULAR | Status: AC
  Filled 2021-03-19: qty 2

## 2021-03-19 MED ORDER — PROPOFOL 10 MG/ML IV BOLUS
INTRAVENOUS | Status: AC
  Filled 2021-03-19: qty 20

## 2021-03-19 MED ORDER — TRAMADOL HCL 50 MG PO TABS: 50.0000 mg | ORAL_TABLET | Freq: Four times a day (QID) | ORAL | 0 refills | Status: AC | PRN

## 2021-03-19 MED ORDER — KETOROLAC TROMETHAMINE 30 MG/ML IJ SOLN
INTRAMUSCULAR | Status: DC | PRN
  Administered 2021-03-19: 30 mg via INTRAVENOUS

## 2021-03-19 MED ORDER — DEXAMETHASONE SODIUM PHOSPHATE 10 MG/ML IJ SOLN
INTRAMUSCULAR | Status: AC
  Filled 2021-03-19: qty 1

## 2021-03-19 MED ORDER — MIDAZOLAM HCL 2 MG/2ML IJ SOLN
INTRAMUSCULAR | Status: AC
  Filled 2021-03-19: qty 2

## 2021-03-19 MED ORDER — CEFAZOLIN SODIUM-DEXTROSE 2-4 GM/100ML-% IV SOLN
2.0000 g | INTRAVENOUS | Status: AC
  Administered 2021-03-19: 2 g via INTRAVENOUS
  Filled 2021-03-19: qty 100

## 2021-03-19 MED ORDER — FENTANYL CITRATE (PF) 100 MCG/2ML IJ SOLN
INTRAMUSCULAR | Status: DC | PRN
  Administered 2021-03-19 (×2): 25 ug via INTRAVENOUS
  Administered 2021-03-19: 50 ug via INTRAVENOUS

## 2021-03-19 MED ORDER — KETOROLAC TROMETHAMINE 30 MG/ML IJ SOLN
INTRAMUSCULAR | Status: AC
  Filled 2021-03-19: qty 1

## 2021-03-19 MED ORDER — SODIUM CHLORIDE 0.9 % IR SOLN
Status: DC | PRN
  Administered 2021-03-19: 3000 mL via INTRAVESICAL

## 2021-03-19 MED ORDER — CHLORHEXIDINE GLUCONATE 0.12 % MT SOLN
15.0000 mL | Freq: Once | OROMUCOSAL | Status: AC
  Administered 2021-03-19: 15 mL via OROMUCOSAL

## 2021-03-19 MED ORDER — LACTATED RINGERS IV SOLN: INTRAVENOUS | Status: DC

## 2021-03-19 MED ORDER — 0.9 % SODIUM CHLORIDE (POUR BTL) OPTIME
TOPICAL | Status: DC | PRN
  Administered 2021-03-19: 1000 mL

## 2021-03-19 MED ORDER — ONDANSETRON HCL 4 MG/2ML IJ SOLN
INTRAMUSCULAR | Status: DC | PRN
  Administered 2021-03-19: 4 mg via INTRAVENOUS

## 2021-03-19 MED ORDER — FENTANYL CITRATE (PF) 100 MCG/2ML IJ SOLN
INTRAMUSCULAR | Status: AC
  Filled 2021-03-19: qty 2

## 2021-03-19 MED ORDER — ACETAMINOPHEN 500 MG PO TABS
1000.0000 mg | ORAL_TABLET | Freq: Once | ORAL | Status: AC
  Administered 2021-03-19: 1000 mg via ORAL
  Filled 2021-03-19: qty 2

## 2021-03-19 MED ORDER — IOHEXOL 300 MG/ML  SOLN
INTRAMUSCULAR | Status: DC | PRN
  Administered 2021-03-19: 6 mL

## 2021-03-19 MED ORDER — PROPOFOL 10 MG/ML IV BOLUS
INTRAVENOUS | Status: DC | PRN
Start: 2021-03-19 — End: 2021-03-19
  Administered 2021-03-19: 150 mg via INTRAVENOUS

## 2021-03-19 SURGICAL SUPPLY — 23 items
BAG URO CATCHER STRL LF (MISCELLANEOUS) ×2 IMPLANT
BASKET LASER NITINOL 1.9FR (BASKET) IMPLANT
BASKET ZERO TIP NITINOL 2.4FR (BASKET) ×1 IMPLANT
BSKT STON RTRVL 120 1.9FR (BASKET)
BSKT STON RTRVL ZERO TP 2.4FR (BASKET) ×1
CATH INTERMIT  6FR 70CM (CATHETERS) ×3 IMPLANT
CLOTH BEACON ORANGE TIMEOUT ST (SAFETY) ×2 IMPLANT
EXTRACTOR STONE 1.7FRX115CM (UROLOGICAL SUPPLIES) IMPLANT
GLOVE SURG ENC MOIS LTX SZ7.5 (GLOVE) ×2 IMPLANT
GOWN STRL REUS W/TWL XL LVL3 (GOWN DISPOSABLE) ×2 IMPLANT
GUIDEWIRE ANG ZIPWIRE 038X150 (WIRE) IMPLANT
GUIDEWIRE STR DUAL SENSOR (WIRE) ×3 IMPLANT
KIT TURNOVER KIT A (KITS) ×2 IMPLANT
LASER FIB FLEXIVA PULSE ID 365 (Laser) IMPLANT
MANIFOLD NEPTUNE II (INSTRUMENTS) ×2 IMPLANT
PACK CYSTO (CUSTOM PROCEDURE TRAY) ×2 IMPLANT
SHEATH URETERAL 12FRX28CM (UROLOGICAL SUPPLIES) IMPLANT
SHEATH URETERAL 12FRX35CM (MISCELLANEOUS) ×1 IMPLANT
STENT URET 6FRX24 CONTOUR (STENTS) ×1 IMPLANT
TRACTIP FLEXIVA PULS ID 200XHI (Laser) IMPLANT
TRACTIP FLEXIVA PULSE ID 200 (Laser)
TUBING CONNECTING 10 (TUBING) ×2 IMPLANT
TUBING UROLOGY SET (TUBING) ×2 IMPLANT

## 2021-03-19 NOTE — Anesthesia Postprocedure Evaluation (Signed)
Anesthesia Post Note  Patient: Courtney Leonard  Procedure(s) Performed: CYSTOSCOPY WITH LEFT RETROGRADE PYELOGRAM URETEROSCOPY AND STONE EXTRACTION AND STENT EXCHANGE (Left)     Patient location during evaluation: PACU Anesthesia Type: General Level of consciousness: awake and alert Pain management: pain level controlled Vital Signs Assessment: post-procedure vital signs reviewed and stable Respiratory status: spontaneous breathing, nonlabored ventilation, respiratory function stable and patient connected to nasal cannula oxygen Cardiovascular status: blood pressure returned to baseline and stable Postop Assessment: no apparent nausea or vomiting Anesthetic complications: no   No notable events documented.  Last Vitals:  Vitals:   03/19/21 1345 03/19/21 1403  BP: 109/69 113/74  Pulse: 80 75  Resp: (!) 8 16  Temp: 36.7 C   SpO2: 100% 98%    Last Pain:  Vitals:   03/19/21 1403  TempSrc:   PainSc: 4                  Tiajuana Amass

## 2021-03-19 NOTE — Anesthesia Preprocedure Evaluation (Signed)
Anesthesia Evaluation  Patient identified by MRN, date of birth, ID band Patient awake    Reviewed: Allergy & Precautions, NPO status , Patient's Chart, lab work & pertinent test results  Airway Mallampati: II  TM Distance: >3 FB Neck ROM: Full    Dental  (+) Dental Advisory Given   Pulmonary neg pulmonary ROS, former smoker,    breath sounds clear to auscultation       Cardiovascular negative cardio ROS   Rhythm:Regular Rate:Normal     Neuro/Psych  Headaches,    GI/Hepatic negative GI ROS, Neg liver ROS,   Endo/Other  Hypothyroidism   Renal/GU Renal disease     Musculoskeletal  (+) Arthritis , Rheumatoid disorders,    Abdominal   Peds  Hematology negative hematology ROS (+)   Anesthesia Other Findings   Reproductive/Obstetrics                             Lab Results  Component Value Date   WBC 9.2 03/05/2021   HGB 13.2 03/05/2021   HCT 39.0 03/05/2021   MCV 100.3 (H) 03/05/2021   PLT 258 03/05/2021   Lab Results  Component Value Date   CREATININE 1.20 (H) 03/05/2021   BUN 17 03/05/2021   NA 142 03/05/2021   K 3.7 03/05/2021   CL 110 03/05/2021   CO2 21 (L) 03/05/2021    Anesthesia Physical Anesthesia Plan  ASA: 2  Anesthesia Plan: General   Post-op Pain Management:    Induction: Intravenous  PONV Risk Score and Plan: 3 and Dexamethasone, Ondansetron and Midazolam  Airway Management Planned: LMA  Additional Equipment:   Intra-op Plan:   Post-operative Plan: Extubation in OR  Informed Consent: I have reviewed the patients History and Physical, chart, labs and discussed the procedure including the risks, benefits and alternatives for the proposed anesthesia with the patient or authorized representative who has indicated his/her understanding and acceptance.     Dental advisory given  Plan Discussed with: CRNA  Anesthesia Plan Comments:          Anesthesia Quick Evaluation

## 2021-03-19 NOTE — Op Note (Signed)
Operative Note  Preoperative diagnosis:  1.  Left renal and ureteral calculi  Postoperative diagnosis: 1.  Left renal and ureteral calculi  Procedure(s): 1.  Cystoscopy with left retrograde pyelogram, left ureteroscopy with stone extraction, ureteral stent exchange  Surgeon: Link Snuffer, MD  Assistants: None  Anesthesia: General  Complications: None immediate  EBL: Minimal  Specimens: 1.  Renal calculus  Drains/Catheters: 1.  6 x 24 double-J ureteral stent  Intraoperative findings: 1.  Normal urethra and bladder 2.  7 millimeter calculus was in the bladder after stent removal and likely migrated out with the stent.  Remainder of the ureter was normal on ureteroscopy.  Digital ureteroscopy revealed a small stone fragment that was basket extracted.  No other clinically significant stone fragments except for some tiny calculi too small to basket.  Retrograde pyelogram revealed no evidence of any filling defect or hydronephrosis.  Indication: 54 year old female status post urgent left ureteral stent placement presents for definitive management of her stone.  Description of procedure:  The patient was identified and consent was obtained.  The patient was taken to the operating room and placed in the supine position.  The patient was placed under general anesthesia.  Perioperative antibiotics were administered.  The patient was placed in dorsal lithotomy.  Patient was prepped and draped in a standard sterile fashion and a timeout was performed.  A 21 French rigid cystoscope was advanced into the urethra and into the bladder.  Complete cystoscopy was performed with no abnormal findings.  The stent was grasped and pulled just beyond the urethral meatus.  A wire was advanced through this up to the kidney under fluoroscopic guidance.  The stent was withdrawn and the wire was secured to the drape.  Semirigid ureteroscopy was performed.  No ureteral calculus was seen up to the level of the  renal pelvis.  I passed a second wire through the scope and into the kidney and withdrew the scope.  A 12 x 14 ureteral access sheath was advanced over the wire under continuous fluoroscopic guidance up to the renal pelvis.  The inner sheath and wire were withdrawn.  Digital ureteroscopy was performed.  Only a small tiny fragment was found in the lower pole which was basket extracted.  There was some Randall's plaque and tiny calculi too small to basket.  I shot a retrograde pyelogram through the scope with the findings noted above.  I then withdrew the scope along with the access sheath visualizing the entire ureter upon removal.  There was no stone seen and no obvious ureteral injury.  I backloaded the wire onto the rigid cystoscope and advanced that into the bladder followed by routine placement of a 6 x 24 double-J ureteral stent.  Fluoroscopy confirmed proximal placement and direct visualization confirmed a good coil within the bladder.  I inspected the bladder mucosa and I saw the submillimeter stone in the bladder which likely was extracted with stent removal.  I drained the bladder and in the process extracted the stone and collected that for analysis.  This include the operation.  Patient tolerated the procedure well and was stable postoperative.  Plan: Follow-up in 1 week for stent removal.

## 2021-03-19 NOTE — Anesthesia Procedure Notes (Signed)
Procedure Name: LMA Insertion Date/Time: 03/19/2021 12:37 PM Performed by: Sharlette Dense, CRNA Patient Re-evaluated:Patient Re-evaluated prior to induction Oxygen Delivery Method: Circle system utilized Preoxygenation: Pre-oxygenation with 100% oxygen Induction Type: IV induction Ventilation: Mask ventilation without difficulty LMA: LMA inserted LMA Size: 4.0 Number of attempts: 1 Placement Confirmation: positive ETCO2 and breath sounds checked- equal and bilateral Tube secured with: Tape Dental Injury: Teeth and Oropharynx as per pre-operative assessment

## 2021-03-19 NOTE — H&P (Signed)
CC: I have kidney stones.  HPI: Courtney Leonard is a 55 year-old female established patient who is here for renal calculi.  03/07/2021: Starting Wednesday morning she developed left flank pain that was sharp, intermittent, moderate to severe and nonraditing. She was treated for a UTI with macrobid. She has new urinary frequency, urgency, and dysuria. UA is grossly bloody. CT from 8/3 shows a 3-10m left proximal ureteral calculus.   03/14/2021: Patient underwent ureteral stent placement with the on-call urologist last week. Now back today for follow-up exam. Patient continues previously prescribed antimicrobial treatment for the next 2 days. She continues to have intermittent left-sided pain and discomfort but this has been somewhat controlled with intermittent use of Percocet, she has to take half tab or otherwise she will have delusion area a night terrors. Ibuprofen has been ineffective. She denies interval fevers or chills, nausea/vomiting. She is having normal bowel movements. She has had an expected increase in lower urinary tract symptoms describing bothersome frequency/urgency, bladder spasms and burning/painful urination. Hematuria has been minimal.   The problem is on the left side. She first stated noticing pain on approximately 03/05/2021. This is her first kidney stone. She is currently having flank pain, back pain, and groin pain. She denies having nausea, vomiting, fever, and chills. She has not caught a stone in her urine strainer since her symptoms began.   She has never had surgical treatment for calculi in the past.     ALLERGIES: Clindamycin Sulfa    MEDICATIONS: Amitriptyline Hcl  Dulcolax  Effexor Xr  Folic Acid  Humira  Methotrexate  Synthroid  Topamax  Trazodone Hcl     GU PSH: Hysterectomy - 2004       PSH Notes: Lipo/excessive skin removal - 2022   NON-GU PSH: Cesarean Delivery - 1991 Hemorrhoidectomy - 2004     GU PMH: Ureteral calculus - 03/07/2021     NON-GU PMH: Anxiety Depression Hypothyroidism Rheumatoid Arthritis    FAMILY HISTORY: 2 daughters - Daughter abdominal aortic aneurysm - Daughter Prostate Cancer - Father   SOCIAL HISTORY: Marital Status: Married Preferred Language: English; Ethnicity: Not Hispanic Or Latino; Race: White Drinks 2 drinks per month.  Drinks 4+ caffeinated drinks per day. 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 Patient denies being pregnant, leakage of urine, stream starts and stops, trouble starting your stream, and have to strain to urinate.  Gastrointestinal (Upper):   Patient denies nausea, vomiting, and indigestion/ heartburn.  Gastrointestinal (Lower):   Patient denies diarrhea and constipation.  Constitutional:   Patient reports fatigue. Patient denies fever, night sweats, and weight loss.  Skin:   Patient denies skin rash/ lesion and itching.  Eyes:   Patient denies blurred vision and double vision.  Ears/ Nose/ Throat:   Patient denies sore throat and sinus problems.  Hematologic/Lymphatic:   Patient denies swollen glands and easy bruising.  Cardiovascular:   Patient denies leg swelling and chest pains.  Respiratory:   Patient denies cough and shortness of breath.  Endocrine:   Patient denies excessive thirst.  Musculoskeletal:   Patient reports back pain. Patient denies joint pain.  Neurological:   Patient denies headaches and dizziness.  Psychologic:   Patient denies depression and anxiety.   VITAL SIGNS:      03/14/2021 09:00 AM  Weight 151 lb / 68.49 kg  Height 64 in / 162.56 cm  BP 125/82 mmHg  Pulse 97 /min  Temperature 97.7 F / 36.5 C  BMI 25.9 kg/m   MULTI-SYSTEM PHYSICAL EXAMINATION:    Constitutional: Well-nourished. No physical deformities. Normally developed. Good grooming.  Neck: Neck symmetrical, not  swollen. Normal tracheal position.  Respiratory: No labored breathing, no use of accessory muscles.   Cardiovascular: Normal temperature, normal extremity pulses, no swelling, no varicosities.  Skin: No paleness, no jaundice, no cyanosis. No lesion, no ulcer, no rash.  Neurologic / Psychiatric: Oriented to time, oriented to place, oriented to person. No depression, no anxiety, no agitation.  Gastrointestinal: No mass, no tenderness, no rigidity, non obese abdomen.  Musculoskeletal: Normal gait and station of head and neck.     Complexity of Data:  Source Of History:  Patient, Family/Caregiver, Medical Record Summary  Records Review:   Previous Doctor Records, Previous Hospital Records, Previous Patient Records  Urine Test Review:   Urinalysis, Urine Culture  X-Ray Review: C.T. Abdomen/Pelvis: Reviewed Films. Reviewed Report.     03/14/21  Urinalysis  Urine Appearance Cloudy   Urine Color Yellow   Urine Glucose Neg mg/dL  Urine Bilirubin Neg mg/dL  Urine Ketones Neg mg/dL  Urine Specific Gravity 1.020   Urine Blood 3+ ery/uL  Urine pH 6.5   Urine Protein 2+ mg/dL  Urine Urobilinogen 0.2 mg/dL  Urine Nitrites Neg   Urine Leukocyte Esterase 2+ leu/uL  Urine WBC/hpf 6 - 10/hpf   Urine RBC/hpf >60/hpf   Urine Epithelial Cells 0 - 5/hpf   Urine Bacteria Few (10-25/hpf)   Urine Mucous Present   Urine Yeast NS (Not Seen)   Urine Trichomonas Not Present   Urine Cystals Amorph Urates   Urine Casts NS (Not Seen)   Urine Sperm Not Present   Notes:                     CLINICAL DATA: Abdominal pain and left flank pain starting this   morning. Urinary tract infection     EXAM:   CT ABDOMEN AND PELVIS WITHOUT CONTRAST     TECHNIQUE:   Multidetector CT imaging of the abdomen and pelvis was performed   following the standard protocol without IV contrast.     COMPARISON: Pelvic MRI from 08/16/2014     FINDINGS:   Lower chest: Unremarkable     Hepatobiliary:  Unremarkable     Pancreas: Unremarkable     Spleen: Unremarkable     Adrenals/Urinary Tract: Both adrenal glands appear normal.     There is mild left hydronephrosis and asymmetric left perirenal   stranding with mild proximal left hydroureter extending down to a 4   mm proximal ureteral calculus just above the level of the iliac   crests on image 38 series 2. The left ureter distal to this level is   of normal caliber.     There are 2 right kidney lower pole nonobstructive renal calculi,   the larger measuring 0.4 cm in diameter.     There are 5 left kidney lower pole nonobstructive renal calculi,   largest measuring 0.3 cm in diameter.     Adrenal glands unremarkable. Urinary bladder unremarkable.     Stomach/Bowel: Mild fatty prominence of the ileocecal valve. Normal   appendix. No dilated bowel.     Vascular/Lymphatic: Unremarkable     Reproductive: Uterus and left ovary absent. Right ovary 3.2 by 2.9   cm.     Other: No supplemental  non-categorized findings.     Musculoskeletal: Unremarkable     IMPRESSION:   1. Mild left hydronephrosis and hydroureter extending down to a 4 mm   left proximal ureteral calculus just above the level of the iliac   crests.   2. Bilateral nonobstructive nephrolithiasis.       Electronically Signed   By: Van Clines M.D.   On: 03/05/2021 17:09   PROCEDURES:          Urinalysis w/Scope Dipstick Dipstick Cont'd Micro  Color: Yellow Bilirubin: Neg mg/dL WBC/hpf: 6 - 10/hpf  Appearance: Cloudy Ketones: Neg mg/dL RBC/hpf: >60/hpf  Specific Gravity: 1.020 Blood: 3+ ery/uL Bacteria: Few (10-25/hpf)  pH: 6.5 Protein: 2+ mg/dL Cystals: Amorph Urates  Glucose: Neg mg/dL Urobilinogen: 0.2 mg/dL Casts: NS (Not Seen)    Nitrites: Neg Trichomonas: Not Present    Leukocyte Esterase: 2+ leu/uL Mucous: Present      Epithelial Cells: 0 - 5/hpf      Yeast: NS (Not Seen)      Sperm: Not Present     ASSESSMENT:      ICD-10 Details  1 GU:   Ureteral calculus - N20.1 Left, Acute, Systemic Symptoms  2   Renal calculus - N20.0 Bilateral, Undiagnosed New Problem   PLAN:            Medications New Meds: Oxybutynin Chloride 5 mg tablet 1 tablet PO TID PRN   #30  1 Refill(s)  Hydrocodone-Acetaminophen 5 mg-325 mg tablet 1 tablet PO Q 6 H PRN   #20  0 Refill(s)            Orders Labs Urine Culture          Schedule Return Visit/Planned Activity: Next Available Appointment - Refer to [facility] Doc, Schedule Surgery          Document Letter(s):  Created for Patient: Clinical Summary         Notes:   Repeat urine c/s sent today. She will need to be scheduled for next available URS with one of the providers.  She has delusional night terrors with percocet but taken as a 1/2 tablet appropriate pain control was achieved. Historically she has tolerated Vicodin better. I will send in a refill for that today. Control substance database reporting System assessed prior to prescribing. She is having expected urgency, bladder spasms and dysuria. [place] 0 recommended as well as p.r.n. use of oxybutynin which was prescribed today. She will complete previously prescribed antimicrobial treatment which is scheduled to be complete at the end of the weekend. She has been doing well with remaining hydrated and I encouraged her to continue.   For ureteroscopy I described the risks which include heart attack, stroke, pulmonary embolus, death, bleeding, infection, damage to contiguous structures, positioning injury, ureteral stricture, ureteral avulsion, ureteral injury, need for ureteral stent, inability to perform ureteroscopy, need for an interval procedure, inability to clear stone burden, stent discomfort and pain.   I will discuss with some of the providers here in clinic about getting her set up for ureteroscopy as soon as possible. With worsening or poorly controlled symptomatology, fevers or chills,  nausea/vomiting she will contact the office or present to a local emergency department were needed evaluation.    Signed by Jiles Crocker, NP on 03/14/21 at 9:29 AM (EDT

## 2021-03-19 NOTE — Discharge Instructions (Signed)

## 2021-03-19 NOTE — Transfer of Care (Signed)
Immediate Anesthesia Transfer of Care Note  Patient: Courtney Leonard  Procedure(s) Performed: CYSTOSCOPY WITH LEFT RETROGRADE PYELOGRAM URETEROSCOPY AND STONE EXTRACTION AND STENT EXCHANGE (Left)  Patient Location: PACU  Anesthesia Type:General  Level of Consciousness: awake  Airway & Oxygen Therapy: Patient Spontanous Breathing and Patient connected to face mask oxygen  Post-op Assessment: Report given to RN and Post -op Vital signs reviewed and stable  Post vital signs: Reviewed and stable  Last Vitals:  Vitals Value Taken Time  BP 115/70 03/19/21 1323  Temp    Pulse 93 03/19/21 1323  Resp 14 03/19/21 1323  SpO2 100 % 03/19/21 1323  Vitals shown include unvalidated device data.  Last Pain:  Vitals:   03/19/21 1002  TempSrc:   PainSc: 0-No pain         Complications: No notable events documented.

## 2021-03-20 ENCOUNTER — Encounter (HOSPITAL_COMMUNITY): Payer: Self-pay | Admitting: Urology

## 2021-03-26 DIAGNOSIS — N201 Calculus of ureter: Secondary | ICD-10-CM | POA: Diagnosis not present

## 2021-04-03 ENCOUNTER — Other Ambulatory Visit (HOSPITAL_COMMUNITY): Payer: Self-pay

## 2021-04-08 ENCOUNTER — Other Ambulatory Visit (HOSPITAL_COMMUNITY): Payer: Self-pay

## 2021-04-22 ENCOUNTER — Other Ambulatory Visit (HOSPITAL_BASED_OUTPATIENT_CLINIC_OR_DEPARTMENT_OTHER): Payer: Self-pay

## 2021-04-22 MED ORDER — VENLAFAXINE HCL ER 75 MG PO CP24
ORAL_CAPSULE | Freq: Every day | ORAL | 0 refills | Status: DC
  Filled 2021-04-22: qty 90, 30d supply, fill #0

## 2021-04-25 ENCOUNTER — Other Ambulatory Visit (HOSPITAL_BASED_OUTPATIENT_CLINIC_OR_DEPARTMENT_OTHER): Payer: Self-pay

## 2021-04-30 ENCOUNTER — Other Ambulatory Visit (HOSPITAL_BASED_OUTPATIENT_CLINIC_OR_DEPARTMENT_OTHER): Payer: Self-pay

## 2021-05-01 ENCOUNTER — Other Ambulatory Visit: Payer: Self-pay | Admitting: Pharmacist

## 2021-05-01 ENCOUNTER — Other Ambulatory Visit (HOSPITAL_COMMUNITY): Payer: Self-pay

## 2021-05-01 ENCOUNTER — Other Ambulatory Visit (HOSPITAL_BASED_OUTPATIENT_CLINIC_OR_DEPARTMENT_OTHER): Payer: Self-pay

## 2021-05-01 ENCOUNTER — Other Ambulatory Visit: Payer: Self-pay | Admitting: Physician Assistant

## 2021-05-01 DIAGNOSIS — Z111 Encounter for screening for respiratory tuberculosis: Secondary | ICD-10-CM

## 2021-05-01 DIAGNOSIS — Z9225 Personal history of immunosupression therapy: Secondary | ICD-10-CM

## 2021-05-01 MED ORDER — HUMIRA PEN 40 MG/0.8ML ~~LOC~~ PNKT
40.0000 mg | PEN_INJECTOR | SUBCUTANEOUS | 0 refills | Status: DC
  Filled 2021-05-01: qty 2, 28d supply, fill #0
  Filled 2021-06-03: qty 2, 28d supply, fill #1
  Filled 2021-07-09: qty 2, 28d supply, fill #2

## 2021-05-01 MED ORDER — HUMIRA PEN 40 MG/0.8ML ~~LOC~~ PNKT
40.0000 mg | PEN_INJECTOR | SUBCUTANEOUS | 0 refills | Status: DC
  Filled 2021-05-01: qty 6, 84d supply, fill #0

## 2021-05-01 NOTE — Telephone Encounter (Signed)
Next Visit: 08/10/2021  Last Visit: 03/10/2021  Last Fill: 02/10/2021  TG:GYIRSWNIOE arthritis involving multiple sites with positive rheumatoid factor   Current Dose per office note 03/10/2021: Humira 40 mg sq injections every 14 days  Labs: 03/05/2021 CO2 21, Glucose 104, Creat. 1.20, GFR 54, MCV 100.3  TB Gold: 08/30/2020 Neg    Okay to refill Humira?

## 2021-05-02 ENCOUNTER — Other Ambulatory Visit (HOSPITAL_BASED_OUTPATIENT_CLINIC_OR_DEPARTMENT_OTHER): Payer: Self-pay

## 2021-05-05 ENCOUNTER — Other Ambulatory Visit (HOSPITAL_COMMUNITY): Payer: Self-pay

## 2021-05-05 ENCOUNTER — Other Ambulatory Visit (HOSPITAL_BASED_OUTPATIENT_CLINIC_OR_DEPARTMENT_OTHER): Payer: Self-pay

## 2021-05-05 MED ORDER — LEVOTHYROXINE SODIUM 88 MCG PO TABS
ORAL_TABLET | ORAL | 2 refills | Status: DC
  Filled 2021-05-05: qty 90, 90d supply, fill #0
  Filled 2021-08-05: qty 90, 90d supply, fill #1
  Filled 2021-11-10: qty 90, 90d supply, fill #2

## 2021-05-07 DIAGNOSIS — N201 Calculus of ureter: Secondary | ICD-10-CM | POA: Diagnosis not present

## 2021-05-19 ENCOUNTER — Other Ambulatory Visit (HOSPITAL_BASED_OUTPATIENT_CLINIC_OR_DEPARTMENT_OTHER): Payer: Self-pay

## 2021-05-21 ENCOUNTER — Other Ambulatory Visit (HOSPITAL_BASED_OUTPATIENT_CLINIC_OR_DEPARTMENT_OTHER): Payer: Self-pay

## 2021-05-21 MED ORDER — VENLAFAXINE HCL ER 75 MG PO CP24
ORAL_CAPSULE | ORAL | 0 refills | Status: DC
  Filled 2021-05-21: qty 90, 30d supply, fill #0

## 2021-05-30 ENCOUNTER — Other Ambulatory Visit (HOSPITAL_COMMUNITY): Payer: Self-pay

## 2021-06-03 ENCOUNTER — Other Ambulatory Visit (HOSPITAL_COMMUNITY): Payer: Self-pay

## 2021-06-10 ENCOUNTER — Other Ambulatory Visit (HOSPITAL_BASED_OUTPATIENT_CLINIC_OR_DEPARTMENT_OTHER): Payer: Self-pay

## 2021-06-10 ENCOUNTER — Other Ambulatory Visit: Payer: Self-pay | Admitting: Physician Assistant

## 2021-06-10 MED ORDER — METHOTREXATE 2.5 MG PO TABS
ORAL_TABLET | ORAL | 0 refills | Status: DC
  Filled 2021-06-10: qty 96, 84d supply, fill #0

## 2021-06-10 NOTE — Telephone Encounter (Signed)
Next Visit: 09/10/2021  Last Visit: 03/10/2021  Last Fill: 03/10/2021  DX: Rheumatoid arthritis involving multiple sites with positive rheumatoid factor   Current Dose per office note 03/10/2021: methotrexate 2.5 mg 8 tablets every 7 days  Labs: 03/05/2021 Creat. 1.20, GFR 54, Glucose 104, CO2 21, MCV 100.3  Patient advised she is due for labs. Patient will come to update them on Wednesday.   Okay to refill MTX?

## 2021-06-11 ENCOUNTER — Other Ambulatory Visit: Payer: Self-pay | Admitting: *Deleted

## 2021-06-11 DIAGNOSIS — Z79899 Other long term (current) drug therapy: Secondary | ICD-10-CM

## 2021-06-12 ENCOUNTER — Other Ambulatory Visit (HOSPITAL_BASED_OUTPATIENT_CLINIC_OR_DEPARTMENT_OTHER): Payer: Self-pay

## 2021-06-12 DIAGNOSIS — N951 Menopausal and female climacteric states: Secondary | ICD-10-CM | POA: Diagnosis not present

## 2021-06-12 DIAGNOSIS — F5109 Other insomnia not due to a substance or known physiological condition: Secondary | ICD-10-CM | POA: Diagnosis not present

## 2021-06-12 DIAGNOSIS — F419 Anxiety disorder, unspecified: Secondary | ICD-10-CM | POA: Diagnosis not present

## 2021-06-12 LAB — COMPLETE METABOLIC PANEL WITH GFR
AG Ratio: 1.7 (calc) (ref 1.0–2.5)
ALT: 26 U/L (ref 6–29)
AST: 16 U/L (ref 10–35)
Albumin: 4 g/dL (ref 3.6–5.1)
Alkaline phosphatase (APISO): 83 U/L (ref 37–153)
BUN: 17 mg/dL (ref 7–25)
CO2: 28 mmol/L (ref 20–32)
Calcium: 9 mg/dL (ref 8.6–10.4)
Chloride: 109 mmol/L (ref 98–110)
Creat: 0.85 mg/dL (ref 0.50–1.03)
Globulin: 2.4 g/dL (calc) (ref 1.9–3.7)
Glucose, Bld: 68 mg/dL (ref 65–99)
Potassium: 4.3 mmol/L (ref 3.5–5.3)
Sodium: 144 mmol/L (ref 135–146)
Total Bilirubin: 0.4 mg/dL (ref 0.2–1.2)
Total Protein: 6.4 g/dL (ref 6.1–8.1)
eGFR: 81 mL/min/{1.73_m2} (ref >=60)

## 2021-06-12 LAB — CBC WITH DIFFERENTIAL/PLATELET
Absolute Monocytes: 344 cells/uL (ref 200–950)
Basophils Absolute: 39 cells/uL (ref 0–200)
Basophils Relative: 0.9 %
Eosinophils Absolute: 52 cells/uL (ref 15–500)
Eosinophils Relative: 1.2 %
HCT: 39.2 % (ref 35.0–45.0)
Hemoglobin: 12.8 g/dL (ref 11.7–15.5)
Lymphs Abs: 1333 cells/uL (ref 850–3900)
MCH: 32.9 pg (ref 27.0–33.0)
MCHC: 32.7 g/dL (ref 32.0–36.0)
MCV: 100.8 fL — ABNORMAL HIGH (ref 80.0–100.0)
MPV: 10.2 fL (ref 7.5–12.5)
Monocytes Relative: 8 %
Neutro Abs: 2533 cells/uL (ref 1500–7800)
Neutrophils Relative %: 58.9 %
Platelets: 236 10*3/uL (ref 140–400)
RBC: 3.89 10*6/uL (ref 3.80–5.10)
RDW: 13.6 % (ref 11.0–15.0)
Total Lymphocyte: 31 %
WBC: 4.3 10*3/uL (ref 3.8–10.8)

## 2021-06-12 MED ORDER — VENLAFAXINE HCL ER 75 MG PO CP24
225.0000 mg | ORAL_CAPSULE | Freq: Every day | ORAL | 1 refills | Status: DC
  Filled 2021-06-12 – 2021-06-16 (×2): qty 270, 90d supply, fill #0
  Filled 2021-09-15: qty 270, 90d supply, fill #1

## 2021-06-12 MED ORDER — TRAZODONE HCL 50 MG PO TABS
25.0000 mg | ORAL_TABLET | Freq: Every day | ORAL | 1 refills | Status: DC
  Filled 2021-06-12: qty 90, 90d supply, fill #0
  Filled 2021-09-15: qty 90, 90d supply, fill #1

## 2021-06-12 MED ORDER — LORAZEPAM 0.5 MG PO TABS
0.5000 mg | ORAL_TABLET | Freq: Two times a day (BID) | ORAL | 0 refills | Status: DC | PRN
  Filled 2021-06-12: qty 30, 15d supply, fill #0

## 2021-06-12 MED ORDER — AMITRIPTYLINE HCL 25 MG PO TABS
25.0000 mg | ORAL_TABLET | Freq: Every day | ORAL | 1 refills | Status: DC
  Filled 2021-06-12: qty 30, 30d supply, fill #0

## 2021-06-12 NOTE — Progress Notes (Signed)
CBC and CMP WNL

## 2021-06-13 ENCOUNTER — Other Ambulatory Visit (HOSPITAL_BASED_OUTPATIENT_CLINIC_OR_DEPARTMENT_OTHER): Payer: Self-pay

## 2021-06-16 ENCOUNTER — Other Ambulatory Visit (HOSPITAL_BASED_OUTPATIENT_CLINIC_OR_DEPARTMENT_OTHER): Payer: Self-pay

## 2021-06-17 ENCOUNTER — Other Ambulatory Visit (HOSPITAL_COMMUNITY): Payer: Self-pay

## 2021-06-24 ENCOUNTER — Telehealth: Payer: Self-pay | Admitting: Pharmacist

## 2021-06-24 NOTE — Telephone Encounter (Signed)
Called patient to schedule an appointment for the [facility]. I was unable to reach the patient so I left a HIPAA-compliant message requesting that the patient return my call.   Luke Van Ausdall, PharmD, BCACP, CPP Clinical Pharmacist [facility] [phone]  

## 2021-06-27 ENCOUNTER — Telehealth: Payer: Self-pay

## 2021-06-27 NOTE — Telephone Encounter (Signed)
Per [facility], current PA is expiring.  Submitted a Prior Authorization request to [facility] for HUMIRA via CoverMyMeds. Will update once we receive a response.   Key: Courtney Leonard

## 2021-06-30 NOTE — Telephone Encounter (Signed)
Received notification from [facility] regarding a prior authorization for HUMIRA. Authorization has been APPROVED from 06/30/2021 to 06/29/2022. Approval letter sent to scan center, Rinaldo Ratel has been updated.  Patient must continue to fill through [place]: [id number]   Authorization # [id number]

## 2021-07-09 ENCOUNTER — Other Ambulatory Visit (HOSPITAL_COMMUNITY): Payer: Self-pay

## 2021-07-10 DIAGNOSIS — Z131 Encounter for screening for diabetes mellitus: Secondary | ICD-10-CM | POA: Diagnosis not present

## 2021-07-10 DIAGNOSIS — Z23 Encounter for immunization: Secondary | ICD-10-CM | POA: Diagnosis not present

## 2021-07-10 DIAGNOSIS — F331 Major depressive disorder, recurrent, moderate: Secondary | ICD-10-CM | POA: Diagnosis not present

## 2021-07-10 DIAGNOSIS — Z833 Family history of diabetes mellitus: Secondary | ICD-10-CM | POA: Diagnosis not present

## 2021-07-10 DIAGNOSIS — Z90710 Acquired absence of both cervix and uterus: Secondary | ICD-10-CM | POA: Insufficient documentation

## 2021-07-10 DIAGNOSIS — E038 Other specified hypothyroidism: Secondary | ICD-10-CM | POA: Diagnosis not present

## 2021-07-10 DIAGNOSIS — Z87891 Personal history of nicotine dependence: Secondary | ICD-10-CM | POA: Diagnosis not present

## 2021-07-10 DIAGNOSIS — F5109 Other insomnia not due to a substance or known physiological condition: Secondary | ICD-10-CM | POA: Diagnosis not present

## 2021-07-10 DIAGNOSIS — M069 Rheumatoid arthritis, unspecified: Secondary | ICD-10-CM | POA: Diagnosis not present

## 2021-07-10 DIAGNOSIS — Z0000 Encounter for general adult medical examination without abnormal findings: Secondary | ICD-10-CM | POA: Diagnosis not present

## 2021-07-10 DIAGNOSIS — F419 Anxiety disorder, unspecified: Secondary | ICD-10-CM | POA: Diagnosis not present

## 2021-07-15 ENCOUNTER — Other Ambulatory Visit (HOSPITAL_COMMUNITY): Payer: Self-pay

## 2021-07-17 ENCOUNTER — Other Ambulatory Visit (HOSPITAL_BASED_OUTPATIENT_CLINIC_OR_DEPARTMENT_OTHER): Payer: Self-pay

## 2021-07-17 DIAGNOSIS — Z87442 Personal history of urinary calculi: Secondary | ICD-10-CM | POA: Diagnosis not present

## 2021-07-17 DIAGNOSIS — Z79899 Other long term (current) drug therapy: Secondary | ICD-10-CM | POA: Diagnosis not present

## 2021-07-17 DIAGNOSIS — G43709 Chronic migraine without aura, not intractable, without status migrainosus: Secondary | ICD-10-CM | POA: Diagnosis not present

## 2021-07-17 MED ORDER — AMITRIPTYLINE HCL 25 MG PO TABS
ORAL_TABLET | ORAL | 1 refills | Status: DC
  Filled 2021-07-17: qty 30, 30d supply, fill #0

## 2021-07-17 MED ORDER — QULIPTA 30 MG PO TABS
1.0000 | ORAL_TABLET | Freq: Every day | ORAL | 11 refills | Status: DC
  Filled 2021-07-17: qty 30, 30d supply, fill #0
  Filled 2021-08-15 – 2021-08-27 (×2): qty 30, 30d supply, fill #1
  Filled 2021-09-30: qty 30, 30d supply, fill #2
  Filled 2021-10-31: qty 30, 30d supply, fill #3
  Filled 2021-12-01: qty 30, 30d supply, fill #4
  Filled 2022-01-01: qty 30, 30d supply, fill #5
  Filled 2022-02-02: qty 30, 30d supply, fill #6
  Filled 2022-03-09: qty 30, 30d supply, fill #7
  Filled 2022-04-09: qty 30, 30d supply, fill #8
  Filled 2022-05-15: qty 30, 30d supply, fill #9
  Filled 2022-06-12: qty 30, 30d supply, fill #10
  Filled 2022-07-14: qty 30, 30d supply, fill #11

## 2021-07-18 ENCOUNTER — Other Ambulatory Visit (HOSPITAL_BASED_OUTPATIENT_CLINIC_OR_DEPARTMENT_OTHER): Payer: Self-pay

## 2021-07-18 MED ORDER — LEVOTHYROXINE SODIUM 88 MCG PO TABS: 88.0000 ug | ORAL_TABLET | Freq: Every day | ORAL | 3 refills | Status: DC

## 2021-07-22 ENCOUNTER — Encounter: Payer: Self-pay | Admitting: Nurse Practitioner

## 2021-08-05 ENCOUNTER — Other Ambulatory Visit (HOSPITAL_BASED_OUTPATIENT_CLINIC_OR_DEPARTMENT_OTHER): Payer: Self-pay

## 2021-08-11 ENCOUNTER — Other Ambulatory Visit: Payer: Self-pay | Admitting: Physician Assistant

## 2021-08-11 ENCOUNTER — Other Ambulatory Visit (HOSPITAL_COMMUNITY): Payer: Self-pay

## 2021-08-11 MED ORDER — HUMIRA PEN 40 MG/0.8ML ~~LOC~~ PNKT
40.0000 mg | PEN_INJECTOR | SUBCUTANEOUS | 0 refills | Status: DC
  Filled 2021-08-11: qty 2, 28d supply, fill #0
  Filled 2021-09-03: qty 2, 28d supply, fill #1

## 2021-08-11 NOTE — Telephone Encounter (Signed)
Next Visit: 09/10/2021   Last Visit: 03/10/2021   Last Fill: 05/01/2021  DX: Rheumatoid arthritis involving multiple sites with positive rheumatoid factor    Current Dose per office note 03/10/2021: Humira 40 mg sq injections every 14 days  Labs: 07/10/2021 RBC 3.72, MCV 98.4,   TB Gold: 08/30/2020 Neg   Okay to refill Humira?

## 2021-08-12 ENCOUNTER — Other Ambulatory Visit (HOSPITAL_COMMUNITY): Payer: Self-pay

## 2021-08-15 ENCOUNTER — Other Ambulatory Visit (HOSPITAL_BASED_OUTPATIENT_CLINIC_OR_DEPARTMENT_OTHER): Payer: Self-pay

## 2021-08-18 ENCOUNTER — Other Ambulatory Visit (HOSPITAL_COMMUNITY): Payer: Self-pay

## 2021-08-20 ENCOUNTER — Other Ambulatory Visit (HOSPITAL_BASED_OUTPATIENT_CLINIC_OR_DEPARTMENT_OTHER): Payer: Self-pay

## 2021-08-27 ENCOUNTER — Other Ambulatory Visit: Payer: Self-pay | Admitting: Physician Assistant

## 2021-08-27 ENCOUNTER — Other Ambulatory Visit (HOSPITAL_BASED_OUTPATIENT_CLINIC_OR_DEPARTMENT_OTHER): Payer: Self-pay

## 2021-08-28 ENCOUNTER — Other Ambulatory Visit (HOSPITAL_BASED_OUTPATIENT_CLINIC_OR_DEPARTMENT_OTHER): Payer: Self-pay

## 2021-08-28 MED ORDER — METHOTREXATE 2.5 MG PO TABS
20.0000 mg | ORAL_TABLET | ORAL | 0 refills | Status: DC
  Filled 2021-08-28: qty 96, 84d supply, fill #0

## 2021-08-28 NOTE — Telephone Encounter (Signed)
Next Visit: 09/10/2021  Last Visit: 03/10/2021  Last Fill: 06/10/2021  DX: Rheumatoid arthritis involving multiple sites with positive rheumatoid factor   Current Dose per office note 03/10/2021: methotrexate 2.5 mg 8 tablets every 7 days  Labs: 07/10/2021 RBC 3.72, MCV 98.4  Okay to refill MTX?

## 2021-08-29 ENCOUNTER — Other Ambulatory Visit (HOSPITAL_BASED_OUTPATIENT_CLINIC_OR_DEPARTMENT_OTHER): Payer: Self-pay

## 2021-08-29 NOTE — Progress Notes (Addendum)
Office Visit Note  Patient: Courtney Leonard             Date of Birth: Feb 12, 1966           MRN: [id number]             PCP: Deborah Chalk, FNP Referring: Deborah Chalk, FNP Visit Date: 09/10/2021 Occupation: @GUAROCC @  Subjective:  Medication management  History of Present Illness: Courtney Leonard is a 56 y.o. female with history of seropositive rheumatoid arthritis.  She states she has been having some pain and stiffness in her hands with the weather change.  She has not noticed any joint swelling.  She has occasional stiffness in her feet.  She has been taking Humira and methotrexate on a regular basis without any side effects.  She has not missed any doses.  Activities of Daily Living:  Patient reports morning stiffness for several hours.   Patient Denies nocturnal pain.  Difficulty dressing/grooming: Denies Difficulty climbing stairs: Reports Difficulty getting out of chair: Denies Difficulty using hands for taps, buttons, cutlery, and/or writing: Reports  Review of Systems  Constitutional:  Positive for fatigue.  HENT:  Negative for mouth sores, mouth dryness and nose dryness.   Eyes:  Negative for pain, itching and dryness.  Respiratory:  Negative for shortness of breath and difficulty breathing.   Cardiovascular:  Negative for chest pain and palpitations.  Gastrointestinal:  Negative for blood in stool, constipation and diarrhea.  Endocrine: Negative for increased urination.  Genitourinary:  Negative for difficulty urinating.  Musculoskeletal:  Positive for joint pain, joint pain and morning stiffness. Negative for joint swelling, myalgias, muscle tenderness and myalgias.  Skin:  Negative for color change, rash, redness and sensitivity to sunlight.  Allergic/Immunologic: Negative for susceptible to infections.  Neurological:  Negative for dizziness, numbness, headaches, memory loss and weakness.  Hematological:  Positive for bruising/bleeding tendency. Negative  for swollen glands.  Psychiatric/Behavioral:  Negative for confusion.    PMFS History:  Patient Active Problem List   Diagnosis Date Noted   Rheumatoid arthritis involving multiple sites with positive rheumatoid factor ([place]) 06/13/2016   High risk medication use 06/13/2016   Primary osteoarthritis of both feet 06/13/2016   Insomnia 06/13/2016   Acquired hypothyroidism 06/13/2016   Migraine without status migrainosus, not intractable 06/13/2016   Former smoker 06/13/2016   Anxiety 01/08/2011    Past Medical History:  Diagnosis Date   Anal fissure    Anxiety    Arthritis    Depression    Hypothyroidism    Insomnia    Migraine    Osteoarthritis    Rheumatoid arthritis ([place])    Thyroid disease     Family History  Problem Relation Age of Onset   Diabetes Mother    Hypertension Mother    Atrial fibrillation Father    Prostate cancer Father    Past Surgical History:  Procedure Laterality Date   ABDOMINAL HYSTERECTOMY     CESAREAN SECTION     CYSTOSCOPY W/ URETERAL STENT PLACEMENT Left 03/07/2021   Procedure: CYSTOSCOPY WITH STENT REPLACEMENT, RETROGRADE;  Surgeon: Cleon Gustin, MD;  Location: [facility];  Service: Urology;  Laterality: Left;   CYSTOSCOPY WITH URETEROSCOPY AND STENT PLACEMENT Left 03/19/2021   Procedure: CYSTOSCOPY WITH LEFT RETROGRADE PYELOGRAM URETEROSCOPY AND STONE EXTRACTION AND STENT EXCHANGE;  Surgeon: Lucas Mallow, MD;  Location: [facility];  Service: Urology;  Laterality: Left;   HEMORRHOID SURGERY     KNEE SURGERY  RT knee   laproscopy     LIPOSUCTION  01/15/2021   & excess skin removal   MOUTH SURGERY     ROBOTIC ASSISTED SALPINGO OOPHERECTOMY Left 09/12/2014   Procedure: ROBOTIC ASSISTED Left SALPINGO OOPHORECTOMY/Right Salpingectomy/Pelvic Washings;  Surgeon: Princess Bruins, MD;  Location: [facility];  Service: Gynecology;  Laterality: Left;   UTERINE STENT PLACEMENT  03/07/2021   Social History   Social History Narrative   Not on  file   Immunization History  Administered Date(s) Administered   Hepatitis B 11/08/2009, 12/11/2009   Influenza,trivalent, recombinat, inj, PF 05/17/2006, 05/03/2013   Influenza-Unspecified 05/21/2017, 05/01/2018   PFIZER Comirnaty(Gray Top)Covid-19 Tri-Sucrose Vaccine 08/24/2019, 09/18/2019, 08/30/2020   PFIZER(Purple Top)SARS-COV-2 Vaccination 08/04/2019, 08/25/2019   Pneumococcal Polysaccharide-23 04/10/2016   Td 06/26/2003   Tdap 03/06/2010, 01/13/2016   Varicella 12/25/2009, 01/24/2010   Zoster Recombinat (Shingrix) 09/24/2018, 03/17/2019     Objective: Vital Signs: BP 106/76 (BP Location: Left Arm, Patient Position: Sitting, Cuff Size: Normal)    Pulse 83    Ht 5\' 4"  (1.626 m)    Wt 168 lb (76.2 kg)    BMI 28.84 kg/m    Physical Exam Vitals and nursing note reviewed.  Constitutional:      Appearance: She is well-developed.  HENT:     Head: Normocephalic and atraumatic.  Eyes:     Conjunctiva/sclera: Conjunctivae normal.  Cardiovascular:     Rate and Rhythm: Normal rate and regular rhythm.     Heart sounds: Normal heart sounds.  Pulmonary:     Effort: Pulmonary effort is normal.     Breath sounds: Normal breath sounds.  Abdominal:     General: Bowel sounds are normal.     Palpations: Abdomen is soft.  Musculoskeletal:     Cervical back: Normal range of motion.  Lymphadenopathy:     Cervical: No cervical adenopathy.  Skin:    General: Skin is warm and dry.     Capillary Refill: Capillary refill takes less than 2 seconds.  Neurological:     Mental Status: She is alert and oriented to person, place, and time.  Psychiatric:        Behavior: Behavior normal.     Musculoskeletal Exam: C-spine was in good range of motion.  Shoulder joints, elbow joints, wrist joints, MCPs PIPs and DIPs with good range of motion.  Mild synovial thickening was noted over right second and third MCP joints but no synovitis was noted.  There was no tenderness over PIPs or DIPs.  Hip joints  and knee joints with good range of motion.  There was no tenderness over ankles or MTPs.  CDAI Exam: CDAI Score: 0.6  Patient Global: 3 mm; Provider Global: 3 mm Swollen: 0 ; Tender: 0  Joint Exam 09/10/2021   No joint exam has been documented for this visit   There is currently no information documented on the homunculus. Go to the Rheumatology activity and complete the homunculus joint exam.  Investigation: No additional findings.  Imaging: No results found.  Recent Labs: Lab Results  Component Value Date   WBC 4.3 06/11/2021   HGB 12.8 06/11/2021   PLT 236 06/11/2021   NA 144 06/11/2021   K 4.3 06/11/2021   CL 109 06/11/2021   CO2 28 06/11/2021   GLUCOSE 68 06/11/2021   BUN 17 06/11/2021   CREATININE 0.85 06/11/2021   BILITOT 0.4 06/11/2021   ALKPHOS 85 03/05/2021   AST 16 06/11/2021   ALT 26 06/11/2021   PROT 6.4  06/11/2021   ALBUMIN 4.9 03/05/2021   CALCIUM 9.0 06/11/2021   GFRAA 79 12/12/2020   QFTBGOLDPLUS NEGATIVE 08/30/2020    Speciality Comments: No specialty comments available.  Procedures:  No procedures performed Allergies: Clindamycin/lincomycin, Elemental sulfur, and Oxycodone   Assessment / Plan:     Visit Diagnoses: Rheumatoid arthritis involving multiple sites with positive rheumatoid factor ([place]) -she has been experiencing increased pain and discomfort in her bilateral hands with the weather change.  She also has some discomfort in her feet off and on.  No synovitis was noted.  I will obtain sed rate today.  Have advised her to contact me in case she develops any increased swelling.  Plan: Sedimentation rate.  Increased risk of heart disease with rheumatoid arthritis was discussed.  Dietary modifications and exercise was as were emphasized.  A handout was placed in the AVS.  High risk medication use - Humira 40 mg sq injections every 14 days, methotrexate 2.5 mg 8 tablets every 7 days, and folic acid 1 mg 2 tablets daily -Labs obtained on June 11, 2021 CBC with differential and CMP with GFR were normal.  Lab findings were reviewed with the patient.  TB gold was negative on August 30, 2020.  Plan: CBC with Differential/Platelet, COMPLETE METABOLIC PANEL WITH GFR, QuantiFERON-TB Gold Plus today.  Information on immunization was placed in the AVS.  She has been advised to stop Humira and methotrexate in case she develops an infection and resume once the infection resolves.  She was advised to get annual skin examination to screen for skin cancer while she is on Humira.  Pain in both hands -she has been experiencing some stiffness in her hands.  No synovitis was noted.  X-rays obtained on 10/07/2020 did not show any radiographic progression when compared to x-rays from 09/13/2018.  Primary osteoarthritis of both feet-she complains of off-and-on discomfort in her feet.  No synovitis was noted.  History of migraine -she is on treatment.  History of hypothyroidism - She remains on Synthroid.  History of anxiety - She is prescribed Effexor and Ativan.   Primary insomnia  Former smoker  Orders: Orders Placed This Encounter  Procedures   CBC with Differential/Platelet   COMPLETE METABOLIC PANEL WITH GFR   Sedimentation rate   QuantiFERON-TB Gold Plus   No orders of the defined types were placed in this encounter.    Follow-Up Instructions: Return in about 5 months (around 02/07/2022) for Rheumatoid arthritis.   Bo Merino, MD  Note - This record has been created using Editor, commissioning.  Chart creation errors have been sought, but may not always  have been located. Such creation errors do not reflect on  the standard of medical care.

## 2021-09-02 ENCOUNTER — Other Ambulatory Visit (HOSPITAL_COMMUNITY): Payer: Self-pay

## 2021-09-03 ENCOUNTER — Other Ambulatory Visit (HOSPITAL_COMMUNITY): Payer: Self-pay

## 2021-09-08 ENCOUNTER — Other Ambulatory Visit (HOSPITAL_COMMUNITY): Payer: Self-pay

## 2021-09-10 ENCOUNTER — Ambulatory Visit: Payer: 59 | Admitting: Rheumatology

## 2021-09-10 ENCOUNTER — Other Ambulatory Visit: Payer: Self-pay

## 2021-09-10 ENCOUNTER — Encounter: Payer: Self-pay | Admitting: Rheumatology

## 2021-09-10 VITALS — BP 106/76 | HR 83 | Ht 64.0 in | Wt 168.0 lb

## 2021-09-10 DIAGNOSIS — Z8659 Personal history of other mental and behavioral disorders: Secondary | ICD-10-CM

## 2021-09-10 DIAGNOSIS — F5101 Primary insomnia: Secondary | ICD-10-CM

## 2021-09-10 DIAGNOSIS — Z8639 Personal history of other endocrine, nutritional and metabolic disease: Secondary | ICD-10-CM | POA: Diagnosis not present

## 2021-09-10 DIAGNOSIS — M0579 Rheumatoid arthritis with rheumatoid factor of multiple sites without organ or systems involvement: Secondary | ICD-10-CM | POA: Diagnosis not present

## 2021-09-10 DIAGNOSIS — Z79899 Other long term (current) drug therapy: Secondary | ICD-10-CM | POA: Diagnosis not present

## 2021-09-10 DIAGNOSIS — Z8669 Personal history of other diseases of the nervous system and sense organs: Secondary | ICD-10-CM | POA: Diagnosis not present

## 2021-09-10 DIAGNOSIS — Z87891 Personal history of nicotine dependence: Secondary | ICD-10-CM | POA: Diagnosis not present

## 2021-09-10 DIAGNOSIS — M19071 Primary osteoarthritis, right ankle and foot: Secondary | ICD-10-CM | POA: Diagnosis not present

## 2021-09-10 DIAGNOSIS — M79641 Pain in right hand: Secondary | ICD-10-CM

## 2021-09-10 DIAGNOSIS — M19072 Primary osteoarthritis, left ankle and foot: Secondary | ICD-10-CM

## 2021-09-10 DIAGNOSIS — M79642 Pain in left hand: Secondary | ICD-10-CM

## 2021-09-10 NOTE — Patient Instructions (Addendum)
Standing Labs We placed an order today for your standing lab work.   Please have your standing labs drawn in May and every 3 months  If possible, please have your labs drawn 2 weeks prior to your appointment so that the provider can discuss your results at your appointment.  Please note that you may see your imaging and lab results in [place] before we have reviewed them. We may be awaiting multiple results to interpret others before contacting you. Please allow our office up to 72 hours to thoroughly review all of the results before contacting the office for clarification of your results.  We have open lab daily: Monday through Thursday from 1:30-4:30 PM and Friday from 1:30-4:00 PM at the office of Dr. Bo Merino, [facility].   Please be advised, all patients with office appointments requiring lab work will take precedent over walk-in lab work.  If possible, please come for your lab work on Monday and Friday afternoons, as you may experience shorter wait times. The office is located at [address], [place], [place], [place] [address] No appointment is necessary.   Labs are drawn by [facility]. Please bring your co-pay at the time of your lab draw.  You may receive a bill from [facility] for your lab work.  Please note if you are on Hydroxychloroquine and and an order has been placed for a Hydroxychloroquine level, you will need to have it drawn 4 hours or more after your last dose.  If you wish to have your labs drawn at another location, please call the office 24 hours in advance to send orders.  If you have any questions regarding directions or hours of operation,  please call [phone].   As a reminder, please drink plenty of water prior to coming for your lab work. Thanks!   Vaccines You are taking a medication(s) that can suppress your immune system.  The following immunizations are recommended: Flu annually Covid-19  Td/Tdap (tetanus, diphtheria, pertussis)  every 10 years Pneumonia (Prevnar 15 then Pneumovax 23 at least 1 year apart.  Alternatively, can take Prevnar 20 without needing additional dose) Shingrix: 2 doses from 4 weeks to 6 months apart  Please check with your PCP to make sure you are up to date.   If you have signs or symptoms of an infection or start antibiotics: First, call your PCP for workup of your infection. Hold your medication through the infection, until you complete your antibiotics, and until symptoms resolve if you take the following: Injectable medication (Actemra, Benlysta, Cimzia, Cosentyx, Enbrel, Humira, Kevzara, Orencia, Remicade, Simponi, Stelara, Taltz, Tremfya) Methotrexate Leflunomide (Arava) Mycophenolate (Cellcept) Morrie Sheldon, Olumiant, or Rinvoq    Heart Disease Prevention   Your inflammatory disease increases your risk of heart disease which includes heart attack, stroke, atrial fibrillation (irregular heartbeats), high blood pressure, heart failure and atherosclerosis (plaque in the arteries).  It is important to reduce your risk by:   Keep blood pressure, cholesterol, and blood sugar at healthy levels   Smoking Cessation   Maintain a healthy weight  BMI 20-25   Eat a healthy diet  Plenty of fresh fruit, vegetables, and whole grains  Limit saturated fats, foods high in sodium, and added sugars  DASH and Mediterranean diet   Increase physical activity  Recommend moderate physically activity for 150 minutes per week/ 30 minutes a day for five days a week These can be broken up into three separate ten-minute sessions during the day.   Reduce Stress  Meditation, slow breathing exercises, yoga, coloring books  Dental visits twice a year    Please get annual skin examination to screen for skin cancer while you are on Humira

## 2021-09-11 NOTE — Progress Notes (Signed)
CBC shows elevated MCV most likely due to methotrexate use.  Patient should take folic acid on a regular basis.CMP and sed rate are normal.

## 2021-09-12 ENCOUNTER — Telehealth: Payer: Self-pay | Admitting: Rheumatology

## 2021-09-12 LAB — CBC WITH DIFFERENTIAL/PLATELET
Absolute Monocytes: 495 cells/uL (ref 200–950)
Basophils Absolute: 50 cells/uL (ref 0–200)
Basophils Relative: 1 %
Eosinophils Absolute: 50 cells/uL (ref 15–500)
Eosinophils Relative: 1 %
HCT: 39.3 % (ref 35.0–45.0)
Hemoglobin: 13.3 g/dL (ref 11.7–15.5)
Lymphs Abs: 1520 cells/uL (ref 850–3900)
MCH: 34 pg — ABNORMAL HIGH (ref 27.0–33.0)
MCHC: 33.8 g/dL (ref 32.0–36.0)
MCV: 100.5 fL — ABNORMAL HIGH (ref 80.0–100.0)
MPV: 10 fL (ref 7.5–12.5)
Monocytes Relative: 9.9 %
Neutro Abs: 2885 cells/uL (ref 1500–7800)
Neutrophils Relative %: 57.7 %
Platelets: 254 10*3/uL (ref 140–400)
RBC: 3.91 10*6/uL (ref 3.80–5.10)
RDW: 13 % (ref 11.0–15.0)
Total Lymphocyte: 30.4 %
WBC: 5 10*3/uL (ref 3.8–10.8)

## 2021-09-12 LAB — COMPLETE METABOLIC PANEL WITH GFR
AG Ratio: 2.1 (calc) (ref 1.0–2.5)
ALT: 22 U/L (ref 6–29)
AST: 18 U/L (ref 10–35)
Albumin: 4.5 g/dL (ref 3.6–5.1)
Alkaline phosphatase (APISO): 73 U/L (ref 37–153)
BUN: 18 mg/dL (ref 7–25)
CO2: 30 mmol/L (ref 20–32)
Calcium: 9.1 mg/dL (ref 8.6–10.4)
Chloride: 104 mmol/L (ref 98–110)
Creat: 0.84 mg/dL (ref 0.50–1.03)
Globulin: 2.1 g/dL (calc) (ref 1.9–3.7)
Glucose, Bld: 68 mg/dL (ref 65–99)
Potassium: 4.4 mmol/L (ref 3.5–5.3)
Sodium: 141 mmol/L (ref 135–146)
Total Bilirubin: 0.4 mg/dL (ref 0.2–1.2)
Total Protein: 6.6 g/dL (ref 6.1–8.1)
eGFR: 83 mL/min/{1.73_m2} (ref >=60)

## 2021-09-12 LAB — QUANTIFERON-TB GOLD PLUS
Mitogen-NIL: >10 IU/mL
NIL: 0.03 IU/mL
QuantiFERON-TB Gold Plus: NEGATIVE
TB1-NIL: 0.01 IU/mL
TB2-NIL: 0 IU/mL

## 2021-09-12 LAB — SEDIMENTATION RATE: Sed Rate: 2 mm/h (ref 0–30)

## 2021-09-12 NOTE — Telephone Encounter (Signed)
See lab note for details.  

## 2021-09-12 NOTE — Telephone Encounter (Signed)
Patient left a voicemail returning Andrea's call.

## 2021-09-14 NOTE — Progress Notes (Signed)
TB Gold is negative.

## 2021-09-15 ENCOUNTER — Other Ambulatory Visit (HOSPITAL_COMMUNITY): Payer: Self-pay

## 2021-09-15 ENCOUNTER — Other Ambulatory Visit (HOSPITAL_BASED_OUTPATIENT_CLINIC_OR_DEPARTMENT_OTHER): Payer: Self-pay

## 2021-09-15 ENCOUNTER — Ambulatory Visit: Payer: 59 | Attending: Nurse Practitioner | Admitting: Pharmacist

## 2021-09-15 ENCOUNTER — Other Ambulatory Visit: Payer: Self-pay

## 2021-09-15 DIAGNOSIS — Z79899 Other long term (current) drug therapy: Secondary | ICD-10-CM

## 2021-09-15 NOTE — Progress Notes (Signed)
° °  S: Patient presents for review of her specialty medication.  Patient is currently taking Humira for rheumatoid arthritis. Patient is managed by Dr. Estanislado Pandy for this.   Adherence: denies any missed doses.  Efficacy: reports that Humira continues to work well for her.   Drug-drug interactions: none  Screening: TB test: completed per patient Hepatitis: completed per patient  Monitoring: S/sx of infection: denies CBC: see below S/sx of hypersensitivity: denies S/sx of malignancy: denies S/sx of heart failure: denies  O:     Lab Results  Component Value Date   WBC 5.0 09/10/2021   HGB 13.3 09/10/2021   HCT 39.3 09/10/2021   MCV 100.5 (H) 09/10/2021   PLT 254 09/10/2021      Chemistry      Component Value Date/Time   NA 141 09/10/2021 0823   NA 139 04/20/2016 0000   K 4.4 09/10/2021 0823   CL 104 09/10/2021 0823   CO2 30 09/10/2021 0823   BUN 18 09/10/2021 0823   BUN 20 04/20/2016 0000   CREATININE 0.84 09/10/2021 0823   GLU 93 04/20/2016 0000      Component Value Date/Time   CALCIUM 9.1 09/10/2021 0823   ALKPHOS 85 03/05/2021 1647   AST 18 09/10/2021 0823   ALT 22 09/10/2021 0823   BILITOT 0.4 09/10/2021 0823       A/P: 1. Medication review: Patient currently on Humira for rheumatoid arthritis and is tolerating it well. Denies any adverse effects. Reviewed the medication with the patient, including the following: Humira is a TNF blocking agent indicated for ankylosing spondylitis, Crohn's disease, Hidradenitis suppurativa, psoriatic arthritis, plaque psoriasis, ulcerative colitis, and uveitis. The most common adverse effects are infections, headache, and injection site reactions. There is the possibility of an increased risk of malignancy but it is not well understood if this increased risk is due to there medication or the disease state. There are rare cases of pancytopenia and aplastic anemia. No recommendations for any changes at this time.   Benard Halsted, PharmD, [facility], [place] [phone]

## 2021-09-16 ENCOUNTER — Other Ambulatory Visit (HOSPITAL_BASED_OUTPATIENT_CLINIC_OR_DEPARTMENT_OTHER): Payer: Self-pay

## 2021-09-29 ENCOUNTER — Other Ambulatory Visit: Payer: Self-pay | Admitting: Physician Assistant

## 2021-09-29 ENCOUNTER — Other Ambulatory Visit (HOSPITAL_COMMUNITY): Payer: Self-pay

## 2021-09-29 MED ORDER — HUMIRA PEN 40 MG/0.8ML ~~LOC~~ PNKT
40.0000 mg | PEN_INJECTOR | SUBCUTANEOUS | 0 refills | Status: DC
  Filled ????-??-??: fill #0

## 2021-09-29 NOTE — Telephone Encounter (Signed)
Next Visit: 02/11/2022  Last Visit: 09/10/2021  Last Fill: 05/01/2021  XI:[id number] arthritis involving multiple sites with positive rheumatoid factor   Current Dose per office note 09/10/2021: Humira 40 mg sq injections every 14 days  Labs: 09/10/2021 CBC shows elevated MCV most likely due to methotrexate use. CMP and sed rate are normal.  TB Gold: 09/10/2021 Neg   Okay to refill Humira?

## 2021-09-30 ENCOUNTER — Other Ambulatory Visit (HOSPITAL_BASED_OUTPATIENT_CLINIC_OR_DEPARTMENT_OTHER): Payer: Self-pay

## 2021-10-01 ENCOUNTER — Other Ambulatory Visit: Payer: Self-pay | Admitting: Pharmacist

## 2021-10-01 ENCOUNTER — Other Ambulatory Visit (HOSPITAL_COMMUNITY): Payer: Self-pay

## 2021-10-01 ENCOUNTER — Other Ambulatory Visit (HOSPITAL_BASED_OUTPATIENT_CLINIC_OR_DEPARTMENT_OTHER): Payer: Self-pay

## 2021-10-01 MED ORDER — HUMIRA PEN 40 MG/0.8ML ~~LOC~~ PNKT
40.0000 mg | PEN_INJECTOR | SUBCUTANEOUS | 0 refills | Status: DC
  Filled 2021-10-01: qty 2, 28d supply, fill #0
  Filled 2021-10-30: qty 2, 28d supply, fill #1
  Filled 2021-11-27: qty 2, 28d supply, fill #2

## 2021-10-06 ENCOUNTER — Other Ambulatory Visit (HOSPITAL_COMMUNITY): Payer: Self-pay

## 2021-10-21 ENCOUNTER — Encounter: Payer: Self-pay | Admitting: Rheumatology

## 2021-10-22 NOTE — Telephone Encounter (Signed)
It is unlikely that these symptoms are due to underlying RA.  Please encourage the patient to schedule a visit with PCP or dermatology for further evaluation.

## 2021-10-22 NOTE — Telephone Encounter (Signed)
Please clarify if she has a visible rash? Has she followed up with PCP or dermatology yet?  ?Does anything held with the itching? Benadryl or topical agents?

## 2021-10-23 DIAGNOSIS — L299 Pruritus, unspecified: Secondary | ICD-10-CM | POA: Diagnosis not present

## 2021-10-30 ENCOUNTER — Other Ambulatory Visit (HOSPITAL_COMMUNITY): Payer: Self-pay

## 2021-10-31 ENCOUNTER — Other Ambulatory Visit (HOSPITAL_BASED_OUTPATIENT_CLINIC_OR_DEPARTMENT_OTHER): Payer: Self-pay

## 2021-11-05 ENCOUNTER — Other Ambulatory Visit (HOSPITAL_COMMUNITY): Payer: Self-pay

## 2021-11-10 ENCOUNTER — Other Ambulatory Visit (HOSPITAL_BASED_OUTPATIENT_CLINIC_OR_DEPARTMENT_OTHER): Payer: Self-pay

## 2021-11-25 ENCOUNTER — Other Ambulatory Visit (HOSPITAL_COMMUNITY): Payer: Self-pay

## 2021-11-27 ENCOUNTER — Other Ambulatory Visit (HOSPITAL_COMMUNITY): Payer: Self-pay

## 2021-11-28 ENCOUNTER — Other Ambulatory Visit: Payer: Self-pay | Admitting: *Deleted

## 2021-11-28 DIAGNOSIS — Z79899 Other long term (current) drug therapy: Secondary | ICD-10-CM

## 2021-11-29 LAB — CBC WITH DIFFERENTIAL/PLATELET
Absolute Monocytes: 478 cells/uL (ref 200–950)
Basophils Absolute: 42 cells/uL (ref 0–200)
Basophils Relative: 0.8 %
Eosinophils Absolute: 57 cells/uL (ref 15–500)
Eosinophils Relative: 1.1 %
HCT: 38.3 % (ref 35.0–45.0)
Hemoglobin: 13.2 g/dL (ref 11.7–15.5)
Lymphs Abs: 1846 cells/uL (ref 850–3900)
MCH: 34.6 pg — ABNORMAL HIGH (ref 27.0–33.0)
MCHC: 34.5 g/dL (ref 32.0–36.0)
MCV: 100.3 fL — ABNORMAL HIGH (ref 80.0–100.0)
MPV: 10.1 fL (ref 7.5–12.5)
Monocytes Relative: 9.2 %
Neutro Abs: 2777 cells/uL (ref 1500–7800)
Neutrophils Relative %: 53.4 %
Platelets: 268 10*3/uL (ref 140–400)
RBC: 3.82 10*6/uL (ref 3.80–5.10)
RDW: 12.6 % (ref 11.0–15.0)
Total Lymphocyte: 35.5 %
WBC: 5.2 10*3/uL (ref 3.8–10.8)

## 2021-11-29 LAB — COMPLETE METABOLIC PANEL WITH GFR
AG Ratio: 1.8 (calc) (ref 1.0–2.5)
ALT: 17 U/L (ref 6–29)
AST: 15 U/L (ref 10–35)
Albumin: 4.2 g/dL (ref 3.6–5.1)
Alkaline phosphatase (APISO): 77 U/L (ref 37–153)
BUN: 15 mg/dL (ref 7–25)
CO2: 29 mmol/L (ref 20–32)
Calcium: 9.2 mg/dL (ref 8.6–10.4)
Chloride: 105 mmol/L (ref 98–110)
Creat: 0.9 mg/dL (ref 0.50–1.03)
Globulin: 2.4 g/dL (calc) (ref 1.9–3.7)
Glucose, Bld: 91 mg/dL (ref 65–99)
Potassium: 4.6 mmol/L (ref 3.5–5.3)
Sodium: 141 mmol/L (ref 135–146)
Total Bilirubin: 0.2 mg/dL (ref 0.2–1.2)
Total Protein: 6.6 g/dL (ref 6.1–8.1)
eGFR: 76 mL/min/{1.73_m2} (ref >=60)

## 2021-12-01 ENCOUNTER — Other Ambulatory Visit: Payer: Self-pay | Admitting: *Deleted

## 2021-12-01 ENCOUNTER — Other Ambulatory Visit (HOSPITAL_BASED_OUTPATIENT_CLINIC_OR_DEPARTMENT_OTHER): Payer: Self-pay

## 2021-12-01 MED ORDER — METHOTREXATE 2.5 MG PO TABS
20.0000 mg | ORAL_TABLET | ORAL | 0 refills | Status: DC
  Filled 2021-12-01: qty 96, 84d supply, fill #0

## 2021-12-01 NOTE — Telephone Encounter (Signed)
Next Visit: 02/11/2022 ? ?Last Visit: 09/10/2021 ? ?Last Fill: 08/28/2021 ? ?DX: Rheumatoid arthritis involving multiple sites with positive rheumatoid factor ? ?Current Dose per office note 09/10/2021: methotrexate 2.5 mg 8 tablets every 7 days ? ?Labs: 11/28/2021 CMP WNL.  MCV and MCH remain borderline elevated but stable. Rest of CBC WNL.  ? ?Okay to refill MTX?  ?

## 2021-12-01 NOTE — Progress Notes (Signed)
CMP WNL.  MCV and MCH remain borderline elevated but stable. Rest of CBC WNL.

## 2021-12-02 ENCOUNTER — Other Ambulatory Visit (HOSPITAL_COMMUNITY): Payer: Self-pay

## 2021-12-19 ENCOUNTER — Other Ambulatory Visit: Payer: Self-pay | Admitting: Physician Assistant

## 2021-12-19 ENCOUNTER — Other Ambulatory Visit (HOSPITAL_BASED_OUTPATIENT_CLINIC_OR_DEPARTMENT_OTHER): Payer: Self-pay

## 2021-12-19 MED ORDER — VENLAFAXINE HCL ER 75 MG PO CP24
ORAL_CAPSULE | ORAL | 0 refills | Status: DC
  Filled 2021-12-19: qty 90, 30d supply, fill #0

## 2021-12-19 MED ORDER — FOLIC ACID 1 MG PO TABS
2.0000 mg | ORAL_TABLET | Freq: Every day | ORAL | 3 refills | Status: DC
  Filled 2021-12-19: qty 180, 90d supply, fill #0
  Filled 2022-03-19: qty 180, 90d supply, fill #1
  Filled 2022-07-06: qty 180, 90d supply, fill #2
  Filled 2022-10-07: qty 180, 90d supply, fill #3

## 2021-12-19 MED ORDER — TRAZODONE HCL 50 MG PO TABS
ORAL_TABLET | ORAL | 0 refills | Status: DC
  Filled 2021-12-19: qty 30, 30d supply, fill #0

## 2021-12-19 NOTE — Telephone Encounter (Signed)
Next Visit: 02/11/2022  Last Visit: 09/10/2021  Last Fill: 03/10/2021  DX: Rheumatoid arthritis involving multiple sites with positive rheumatoid factor   Current Dose per office note on 10/01/4968: folic acid 1 mg 2 tablets daily  Okay to refill folic acid?

## 2021-12-23 ENCOUNTER — Other Ambulatory Visit: Payer: Self-pay | Admitting: Physician Assistant

## 2021-12-23 ENCOUNTER — Other Ambulatory Visit (HOSPITAL_COMMUNITY): Payer: Self-pay

## 2021-12-23 MED ORDER — HUMIRA PEN 40 MG/0.8ML ~~LOC~~ PNKT
40.0000 mg | PEN_INJECTOR | SUBCUTANEOUS | 0 refills | Status: DC
  Filled ????-??-??: fill #0

## 2021-12-23 NOTE — Telephone Encounter (Signed)
Next Visit: 02/11/2022   Last Visit: 09/10/2021   Last Fill: 10/01/2021   DX: Rheumatoid arthritis involving multiple sites with positive rheumatoid factor   Current Dose per office note 09/10/2021: Humira 40 mg sq injections every 14 days   Labs: 11/28/2021 CMP WNL.  MCV and MCH remain borderline elevated but stable. Rest of CBC WNL.   TB Gold: 09/10/2021 Neg    Okay to refill Humira?

## 2021-12-26 ENCOUNTER — Other Ambulatory Visit: Payer: Self-pay | Admitting: Pharmacist

## 2021-12-26 ENCOUNTER — Other Ambulatory Visit (HOSPITAL_COMMUNITY): Payer: Self-pay

## 2021-12-26 MED ORDER — HUMIRA PEN 40 MG/0.8ML ~~LOC~~ PNKT: 40.0000 mg | PEN_INJECTOR | SUBCUTANEOUS | 0 refills | Status: DC

## 2021-12-26 MED ORDER — HUMIRA PEN 40 MG/0.8ML ~~LOC~~ PNKT
40.0000 mg | PEN_INJECTOR | SUBCUTANEOUS | 0 refills | Status: DC
  Filled 2021-12-26: qty 2, 28d supply, fill #0
  Filled 2022-01-20: qty 2, 28d supply, fill #1
  Filled 2022-02-25: qty 2, 28d supply, fill #2

## 2021-12-31 ENCOUNTER — Other Ambulatory Visit (HOSPITAL_COMMUNITY): Payer: Self-pay

## 2022-01-01 ENCOUNTER — Other Ambulatory Visit (HOSPITAL_BASED_OUTPATIENT_CLINIC_OR_DEPARTMENT_OTHER): Payer: Self-pay

## 2022-01-20 ENCOUNTER — Other Ambulatory Visit (HOSPITAL_COMMUNITY): Payer: Self-pay

## 2022-01-26 ENCOUNTER — Other Ambulatory Visit (HOSPITAL_BASED_OUTPATIENT_CLINIC_OR_DEPARTMENT_OTHER): Payer: Self-pay

## 2022-01-26 DIAGNOSIS — Z803 Family history of malignant neoplasm of breast: Secondary | ICD-10-CM | POA: Diagnosis not present

## 2022-01-26 DIAGNOSIS — F5109 Other insomnia not due to a substance or known physiological condition: Secondary | ICD-10-CM | POA: Diagnosis not present

## 2022-01-26 DIAGNOSIS — E039 Hypothyroidism, unspecified: Secondary | ICD-10-CM | POA: Diagnosis not present

## 2022-01-26 DIAGNOSIS — N951 Menopausal and female climacteric states: Secondary | ICD-10-CM | POA: Diagnosis not present

## 2022-01-26 DIAGNOSIS — F331 Major depressive disorder, recurrent, moderate: Secondary | ICD-10-CM | POA: Diagnosis not present

## 2022-01-26 DIAGNOSIS — F419 Anxiety disorder, unspecified: Secondary | ICD-10-CM | POA: Diagnosis not present

## 2022-01-26 DIAGNOSIS — E038 Other specified hypothyroidism: Secondary | ICD-10-CM | POA: Diagnosis not present

## 2022-01-26 MED ORDER — TRAZODONE HCL 50 MG PO TABS
ORAL_TABLET | ORAL | 1 refills | Status: DC
  Filled 2022-01-26: qty 90, 90d supply, fill #0
  Filled 2022-04-29: qty 90, 90d supply, fill #1

## 2022-01-26 MED ORDER — LEVOTHYROXINE SODIUM 88 MCG PO TABS
88.0000 ug | ORAL_TABLET | Freq: Every day | ORAL | 3 refills | Status: DC
  Filled 2022-01-26: qty 90, 90d supply, fill #0
  Filled 2022-04-29: qty 90, 90d supply, fill #1
  Filled 2022-08-18: qty 90, 90d supply, fill #2
  Filled 2022-11-25: qty 90, 90d supply, fill #3

## 2022-01-26 MED ORDER — VENLAFAXINE HCL ER 75 MG PO CP24
ORAL_CAPSULE | ORAL | 1 refills | Status: DC
  Filled 2022-01-26: qty 270, 90d supply, fill #0
  Filled 2022-04-29: qty 270, 90d supply, fill #1

## 2022-01-26 MED ORDER — LORAZEPAM 0.5 MG PO TABS
0.5000 mg | ORAL_TABLET | Freq: Two times a day (BID) | ORAL | 2 refills | Status: DC | PRN
  Filled 2022-01-26: qty 30, 15d supply, fill #0
  Filled 2022-02-26: qty 30, 15d supply, fill #1
  Filled 2022-03-19: qty 30, 15d supply, fill #2

## 2022-01-27 ENCOUNTER — Other Ambulatory Visit (HOSPITAL_BASED_OUTPATIENT_CLINIC_OR_DEPARTMENT_OTHER): Payer: Self-pay

## 2022-01-29 ENCOUNTER — Other Ambulatory Visit (HOSPITAL_BASED_OUTPATIENT_CLINIC_OR_DEPARTMENT_OTHER): Payer: Self-pay

## 2022-01-29 MED ORDER — SAXENDA 18 MG/3ML ~~LOC~~ SOPN
PEN_INJECTOR | SUBCUTANEOUS | 2 refills | Status: DC
  Filled 2022-04-29: qty 3, 28d supply, fill #0
  Filled 2022-09-07: qty 3, 18d supply, fill #0

## 2022-01-29 MED ORDER — SAXENDA 18 MG/3ML ~~LOC~~ SOPN
PEN_INJECTOR | SUBCUTANEOUS | 0 refills | Status: DC
  Filled 2022-01-29 – 2022-03-09 (×2): qty 15, 30d supply, fill #0

## 2022-01-29 NOTE — Progress Notes (Deleted)
Office Visit Note  Patient: Courtney Leonard             Date of Birth: 1965-09-29           MRN: [id number]             PCP: Deborah Chalk, FNP Referring: Deborah Chalk, FNP Visit Date: 02/11/2022 Occupation: '@GUAROCC'$ @  Subjective:  No chief complaint on file.   History of Present Illness: Courtney Leonard is a 56 y.o. female ***   Activities of Daily Living:  Patient reports morning stiffness for *** {minute/hour:19697}.   Patient {ACTIONS;DENIES/REPORTS:21021675::"Denies"} nocturnal pain.  Difficulty dressing/grooming: {ACTIONS;DENIES/REPORTS:21021675::"Denies"} Difficulty climbing stairs: {ACTIONS;DENIES/REPORTS:21021675::"Denies"} Difficulty getting out of chair: {ACTIONS;DENIES/REPORTS:21021675::"Denies"} Difficulty using hands for taps, buttons, cutlery, and/or writing: {ACTIONS;DENIES/REPORTS:21021675::"Denies"}  No Rheumatology ROS completed.   PMFS History:  Patient Active Problem List   Diagnosis Date Noted  . Rheumatoid arthritis involving multiple sites with positive rheumatoid factor ([place]) 06/13/2016  . High risk medication use 06/13/2016  . Primary osteoarthritis of both feet 06/13/2016  . Insomnia 06/13/2016  . Acquired hypothyroidism 06/13/2016  . Migraine without status migrainosus, not intractable 06/13/2016  . Former smoker 06/13/2016  . Anxiety 01/08/2011    Past Medical History:  Diagnosis Date  . Anal fissure   . Anxiety   . Arthritis   . Depression   . Hypothyroidism   . Insomnia   . Migraine   . Osteoarthritis   . Rheumatoid arthritis ([place])   . Thyroid disease     Family History  Problem Relation Age of Onset  . Diabetes Mother   . Hypertension Mother   . Atrial fibrillation Father   . Prostate cancer Father    Past Surgical History:  Procedure Laterality Date  . ABDOMINAL HYSTERECTOMY    . CESAREAN SECTION    . CYSTOSCOPY W/ URETERAL STENT PLACEMENT Left 03/07/2021   Procedure: CYSTOSCOPY WITH STENT REPLACEMENT,  RETROGRADE;  Surgeon: Cleon Gustin, MD;  Location: [facility];  Service: Urology;  Laterality: Left;  . CYSTOSCOPY WITH URETEROSCOPY AND STENT PLACEMENT Left 03/19/2021   Procedure: CYSTOSCOPY WITH LEFT RETROGRADE PYELOGRAM URETEROSCOPY AND STONE EXTRACTION AND STENT EXCHANGE;  Surgeon: Lucas Mallow, MD;  Location: [facility];  Service: Urology;  Laterality: Left;  . HEMORRHOID SURGERY    . KNEE SURGERY     RT knee  . laproscopy    . LIPOSUCTION  01/15/2021   & excess skin removal  . MOUTH SURGERY    . ROBOTIC ASSISTED SALPINGO OOPHERECTOMY Left 09/12/2014   Procedure: ROBOTIC ASSISTED Left SALPINGO OOPHORECTOMY/Right Salpingectomy/Pelvic Washings;  Surgeon: Princess Bruins, MD;  Location: [facility];  Service: Gynecology;  Laterality: Left;  . UTERINE STENT PLACEMENT  03/07/2021   Social History   Social History Narrative  . Not on file   Immunization History  Administered Date(s) Administered  . Hepatitis B 11/08/2009, 12/11/2009  . Influenza,trivalent, recombinat, inj, PF 05/17/2006, 05/03/2013  . Influenza-Unspecified 05/21/2017, 05/01/2018  . PFIZER Comirnaty(Gray Top)Covid-19 Tri-Sucrose Vaccine 08/24/2019, 09/18/2019, 08/30/2020  . PFIZER(Purple Top)SARS-COV-2 Vaccination 08/04/2019, 08/25/2019  . Pneumococcal Polysaccharide-23 04/10/2016  . Td 06/26/2003  . Tdap 03/06/2010, 01/13/2016  . Varicella 12/25/2009, 01/24/2010  . Zoster Recombinat (Shingrix) 09/24/2018, 03/17/2019     Objective: Vital Signs: There were no vitals taken for this visit.   Physical Exam   Musculoskeletal Exam: ***  CDAI Exam: CDAI Score: -- Patient Global: --; Provider Global: -- Swollen: --; Tender: -- Joint Exam 02/11/2022   No joint exam has been  documented for this visit   There is currently no information documented on the homunculus. Go to the Rheumatology activity and complete the homunculus joint exam.  Investigation: No additional findings.  Imaging: No results  found.  Recent Labs: Lab Results  Component Value Date   WBC 5.2 11/28/2021   HGB 13.2 11/28/2021   PLT 268 11/28/2021   NA 141 11/28/2021   K 4.6 11/28/2021   CL 105 11/28/2021   CO2 29 11/28/2021   GLUCOSE 91 11/28/2021   BUN 15 11/28/2021   CREATININE 0.90 11/28/2021   BILITOT 0.2 11/28/2021   ALKPHOS 85 03/05/2021   AST 15 11/28/2021   ALT 17 11/28/2021   PROT 6.6 11/28/2021   ALBUMIN 4.9 03/05/2021   CALCIUM 9.2 11/28/2021   GFRAA 79 12/12/2020   QFTBGOLDPLUS NEGATIVE 09/10/2021    Speciality Comments: No specialty comments available.  Procedures:  No procedures performed Allergies: Clindamycin/lincomycin, Elemental sulfur, and Oxycodone   Assessment / Plan:     Visit Diagnoses: Rheumatoid arthritis involving multiple sites with positive rheumatoid factor (HCC)  High risk medication use  Primary osteoarthritis of both feet  History of migraine  History of hypothyroidism  History of anxiety  Primary insomnia  Former smoker  Orders: No orders of the defined types were placed in this encounter.  No orders of the defined types were placed in this encounter.   Face-to-face time spent with patient was *** minutes. Greater than 50% of time was spent in counseling and coordination of care.  Follow-Up Instructions: No follow-ups on file.   Ofilia Neas, PA-C  Note - This record has been created using Dragon software.  Chart creation errors have been sought, but may not always  have been located. Such creation errors do not reflect on  the standard of medical care.

## 2022-01-30 ENCOUNTER — Other Ambulatory Visit (HOSPITAL_BASED_OUTPATIENT_CLINIC_OR_DEPARTMENT_OTHER): Payer: Self-pay

## 2022-02-02 ENCOUNTER — Other Ambulatory Visit (HOSPITAL_COMMUNITY): Payer: Self-pay

## 2022-02-02 ENCOUNTER — Other Ambulatory Visit (HOSPITAL_BASED_OUTPATIENT_CLINIC_OR_DEPARTMENT_OTHER): Payer: Self-pay

## 2022-02-04 ENCOUNTER — Other Ambulatory Visit (HOSPITAL_COMMUNITY): Payer: Self-pay

## 2022-02-05 ENCOUNTER — Other Ambulatory Visit (HOSPITAL_BASED_OUTPATIENT_CLINIC_OR_DEPARTMENT_OTHER): Payer: Self-pay

## 2022-02-06 ENCOUNTER — Other Ambulatory Visit (HOSPITAL_BASED_OUTPATIENT_CLINIC_OR_DEPARTMENT_OTHER): Payer: Self-pay

## 2022-02-09 ENCOUNTER — Other Ambulatory Visit (HOSPITAL_BASED_OUTPATIENT_CLINIC_OR_DEPARTMENT_OTHER): Payer: Self-pay

## 2022-02-10 ENCOUNTER — Other Ambulatory Visit (HOSPITAL_BASED_OUTPATIENT_CLINIC_OR_DEPARTMENT_OTHER): Payer: Self-pay

## 2022-02-11 ENCOUNTER — Ambulatory Visit: Payer: 59 | Admitting: Physician Assistant

## 2022-02-11 ENCOUNTER — Other Ambulatory Visit (HOSPITAL_BASED_OUTPATIENT_CLINIC_OR_DEPARTMENT_OTHER): Payer: Self-pay

## 2022-02-11 DIAGNOSIS — Z8659 Personal history of other mental and behavioral disorders: Secondary | ICD-10-CM

## 2022-02-11 DIAGNOSIS — Z8669 Personal history of other diseases of the nervous system and sense organs: Secondary | ICD-10-CM

## 2022-02-11 DIAGNOSIS — Z8639 Personal history of other endocrine, nutritional and metabolic disease: Secondary | ICD-10-CM

## 2022-02-11 DIAGNOSIS — M19071 Primary osteoarthritis, right ankle and foot: Secondary | ICD-10-CM

## 2022-02-11 DIAGNOSIS — F5101 Primary insomnia: Secondary | ICD-10-CM

## 2022-02-11 DIAGNOSIS — Z79899 Other long term (current) drug therapy: Secondary | ICD-10-CM

## 2022-02-11 DIAGNOSIS — M0579 Rheumatoid arthritis with rheumatoid factor of multiple sites without organ or systems involvement: Secondary | ICD-10-CM

## 2022-02-11 DIAGNOSIS — Z87891 Personal history of nicotine dependence: Secondary | ICD-10-CM

## 2022-02-12 ENCOUNTER — Other Ambulatory Visit (HOSPITAL_BASED_OUTPATIENT_CLINIC_OR_DEPARTMENT_OTHER): Payer: Self-pay

## 2022-02-12 ENCOUNTER — Other Ambulatory Visit (HOSPITAL_COMMUNITY): Payer: Self-pay

## 2022-02-17 ENCOUNTER — Other Ambulatory Visit (HOSPITAL_BASED_OUTPATIENT_CLINIC_OR_DEPARTMENT_OTHER): Payer: Self-pay

## 2022-02-19 ENCOUNTER — Other Ambulatory Visit: Payer: Self-pay | Admitting: Rheumatology

## 2022-02-19 ENCOUNTER — Other Ambulatory Visit (HOSPITAL_BASED_OUTPATIENT_CLINIC_OR_DEPARTMENT_OTHER): Payer: Self-pay

## 2022-02-19 MED ORDER — METHOTREXATE 2.5 MG PO TABS: 20.0000 mg | ORAL_TABLET | ORAL | 0 refills | Status: DC

## 2022-02-19 NOTE — Telephone Encounter (Signed)
Patient called requesting prescription refill of Methotrexate to be sent to [facility].

## 2022-02-19 NOTE — Telephone Encounter (Signed)
Next Visit: 03/10/2022  Last Visit: 09/10/2021  Last Fill: 12/01/2021  DX: Rheumatoid arthritis involving multiple sites with positive rheumatoid factor   Current Dose per office note 09/10/2021: methotrexate 2.5 mg 8 tablets every 7 days  Labs: 11/28/2021 CMP WNL.  MCV and MCH remain borderline elevated but stable. Rest of CBC WNL.  Okay to refill MTX?

## 2022-02-24 ENCOUNTER — Other Ambulatory Visit (HOSPITAL_BASED_OUTPATIENT_CLINIC_OR_DEPARTMENT_OTHER): Payer: Self-pay

## 2022-02-24 NOTE — Progress Notes (Signed)
Office Visit Note  Patient: Courtney Leonard             Date of Birth: 03-13-1966           MRN: [id number]             PCP: Deborah Chalk, FNP Referring: Deborah Chalk, FNP Visit Date: 03/10/2022 Occupation: '@GUAROCC'$ @  Subjective:  Medication monitoring   History of Present Illness: Courtney Leonard is a 56 y.o. female with history of seropositive rheumatoid arthritis and osteoarthritis.  Patient remains on Humira 40 mg sq injections every 14 days, methotrexate 8 tablets once weekly, and folic acid 2 mg daily.  She is tolerating combination therapy without any side effects or injection site reactions.  She denies any recent or recurrent infections.  She denies any signs or symptoms of a rheumatoid arthritis flare.  She denies any joint swelling at this time.  She has occasional pain in the left fifth digit of both hands but has not noticed any inflammation.  She denies any other joint pain or joint swelling at this time.  She denies any new medical conditions.  She would like a new referral to dermatology for yearly skin examinations.    Activities of Daily Living:  Patient reports morning stiffness for 2-3 hours.   Patient Denies nocturnal pain.  Difficulty dressing/grooming: Denies Difficulty climbing stairs: Reports Difficulty getting out of chair: Denies Difficulty using hands for taps, buttons, cutlery, and/or writing: Denies  Review of Systems  Constitutional:  Positive for fatigue.  HENT:  Positive for mouth dryness. Negative for mouth sores.   Eyes:  Negative for dryness.  Respiratory:  Negative for shortness of breath.   Cardiovascular:  Negative for chest pain and palpitations.  Gastrointestinal:  Negative for blood in stool, constipation and diarrhea.  Endocrine: Negative for increased urination.  Genitourinary:  Negative for involuntary urination.  Musculoskeletal:  Positive for morning stiffness. Negative for joint pain, joint pain, joint swelling, myalgias,  muscle weakness, muscle tenderness and myalgias.  Skin:  Negative for color change, rash, hair loss and sensitivity to sunlight.  Allergic/Immunologic: Negative for susceptible to infections.  Neurological:  Negative for dizziness and headaches.  Hematological:  Negative for swollen glands.  Psychiatric/Behavioral:  Positive for depressed mood. Negative for sleep disturbance. The patient is nervous/anxious.     PMFS History:  Patient Active Problem List   Diagnosis Date Noted   Rheumatoid arthritis involving multiple sites with positive rheumatoid factor ([place]) 06/13/2016   High risk medication use 06/13/2016   Primary osteoarthritis of both feet 06/13/2016   Insomnia 06/13/2016   Acquired hypothyroidism 06/13/2016   Migraine without status migrainosus, not intractable 06/13/2016   Former smoker 06/13/2016   Anxiety 01/08/2011    Past Medical History:  Diagnosis Date   Anal fissure    Anxiety    Arthritis    Depression    Hypothyroidism    Insomnia    Migraine    Osteoarthritis    Rheumatoid arthritis ([place])    Thyroid disease     Family History  Problem Relation Age of Onset   Diabetes Mother    Hypertension Mother    Atrial fibrillation Father    Prostate cancer Father    Past Surgical History:  Procedure Laterality Date   ABDOMINAL HYSTERECTOMY     CESAREAN SECTION     CYSTOSCOPY W/ URETERAL STENT PLACEMENT Left 03/07/2021   Procedure: CYSTOSCOPY WITH STENT REPLACEMENT, RETROGRADE;  Surgeon: Cleon Gustin, MD;  Location: [facility];  Service: Urology;  Laterality: Left;   CYSTOSCOPY WITH URETEROSCOPY AND STENT PLACEMENT Left 03/19/2021   Procedure: CYSTOSCOPY WITH LEFT RETROGRADE PYELOGRAM URETEROSCOPY AND STONE EXTRACTION AND STENT EXCHANGE;  Surgeon: Lucas Mallow, MD;  Location: [facility];  Service: Urology;  Laterality: Left;   HEMORRHOID SURGERY     KNEE SURGERY     RT knee   laproscopy     LIPOSUCTION  01/15/2021   & excess skin removal   MOUTH SURGERY      ROBOTIC ASSISTED SALPINGO OOPHERECTOMY Left 09/12/2014   Procedure: ROBOTIC ASSISTED Left SALPINGO OOPHORECTOMY/Right Salpingectomy/Pelvic Washings;  Surgeon: Princess Bruins, MD;  Location: [facility];  Service: Gynecology;  Laterality: Left;   UTERINE STENT PLACEMENT  03/07/2021   Social History   Social History Narrative   Not on file   Immunization History  Administered Date(s) Administered   Hepatitis B 11/08/2009, 12/11/2009   Influenza,trivalent, recombinat, inj, PF 05/17/2006, 05/03/2013   Influenza-Unspecified 05/21/2017, 05/01/2018   PFIZER Comirnaty(Gray Top)Covid-19 Tri-Sucrose Vaccine 08/24/2019, 09/18/2019, 08/30/2020   PFIZER(Purple Top)SARS-COV-2 Vaccination 08/04/2019, 08/25/2019   Pneumococcal Polysaccharide-23 04/10/2016   Td 06/26/2003   Tdap 03/06/2010, 01/13/2016   Varicella 12/25/2009, 01/24/2010   Zoster Recombinat (Shingrix) 09/24/2018, 03/17/2019     Objective: Vital Signs: BP 118/80 (BP Location: Left Arm, Patient Position: Sitting, Cuff Size: Normal)   Pulse 79   Resp 16   Ht '5\' 4"'$  (1.626 m)   Wt 183 lb 6.4 oz (83.2 kg)   BMI 31.48 kg/m    Physical Exam Vitals and nursing note reviewed.  Constitutional:      Appearance: She is well-developed.  HENT:     Head: Normocephalic and atraumatic.  Eyes:     Conjunctiva/sclera: Conjunctivae normal.  Cardiovascular:     Rate and Rhythm: Normal rate and regular rhythm.     Heart sounds: Normal heart sounds.  Pulmonary:     Effort: Pulmonary effort is normal.     Breath sounds: Normal breath sounds.  Abdominal:     General: Bowel sounds are normal.     Palpations: Abdomen is soft.  Musculoskeletal:     Cervical back: Normal range of motion.  Skin:    General: Skin is warm and dry.     Capillary Refill: Capillary refill takes less than 2 seconds.  Neurological:     Mental Status: She is alert and oriented to person, place, and time.  Psychiatric:        Behavior: Behavior normal.       Musculoskeletal Exam: C-spine, thoracic spine, and lumbar spine good ROM.  Shoulder joints, elbow joints, wrist joints, Mcps, PIPs, and DIPs good ROM with no synovitis.  Complete fist formation bilaterally. Hip joints, knee joints, and ankle joints have good ROM with no discomfort.  No warmth or effusion of knee joints.  No tenderness or swelling of ankle joints.   CDAI Exam: CDAI Score: 0.4  Patient Global: 2 mm; Provider Global: 2 mm Swollen: 0 ; Tender: 0  Joint Exam 03/10/2022   No joint exam has been documented for this visit   There is currently no information documented on the homunculus. Go to the Rheumatology activity and complete the homunculus joint exam.  Investigation: No additional findings.  Imaging: No results found.  Recent Labs: Lab Results  Component Value Date   WBC 5.2 11/28/2021   HGB 13.2 11/28/2021   PLT 268 11/28/2021   NA 141 11/28/2021   K 4.6 11/28/2021  CL 105 11/28/2021   CO2 29 11/28/2021   GLUCOSE 91 11/28/2021   BUN 15 11/28/2021   CREATININE 0.90 11/28/2021   BILITOT 0.2 11/28/2021   ALKPHOS 85 03/05/2021   AST 15 11/28/2021   ALT 17 11/28/2021   PROT 6.6 11/28/2021   ALBUMIN 4.9 03/05/2021   CALCIUM 9.2 11/28/2021   GFRAA 79 12/12/2020   QFTBGOLDPLUS NEGATIVE 09/10/2021    Speciality Comments: No specialty comments available.  Procedures:  No procedures performed Allergies: Clindamycin/lincomycin, Elemental sulfur, and Oxycodone   Assessment / Plan:     Visit Diagnoses: Rheumatoid arthritis involving multiple sites with positive rheumatoid factor ([place]): She has no joint tenderness or synovitis on examination today.  She has not had any signs or symptoms of a rheumatoid arthritis flare.  She has clinically been doing well on Humira 40 mg subcutaneous injections every 14 days, methotrexate 8 tablets by mouth once weekly, and folic acid 2 mg daily.  She is tolerating combination therapy without any side effects or injection site  reactions.  She has not missed any doses of methotrexate or Humira recently and has not had any recent or recurrent infections.  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  She has no inflammation on examination today.  She will remain on combination therapy as prescribed.  She was advised to notify us if she develops increased joint pain or joint swelling.  She will follow-up in the office in 5 months or sooner if needed.  High risk medication use - Humira 40 mg sq injections every 14 days, methotrexate 2.5 mg 8 tablets every 7 days, and folic acid 1 mg 2 tablets daily  - Plan: CBC with Differential/Platelet, COMPLETE METABOLIC PANEL WITH GFR, Ambulatory referral to Dermatology CBC and CMP were drawn on 11/28/2021.  She is due to update lab work today.  Orders for CBC and CMP were released.  Her next lab work will be due in November and every 3 months to monitor for drug toxicity.  Standing orders remain in place. TB Gold negative on 09/10/2021 and will continue to be monitored yearly. She has not had any recent or recurrent infections. Discussed the importance of holding Humira and methotrexate if she develops signs or symptoms of an infection and to resume once the infection has completely cleared. Referral to dermatology was placed today for yearly skin examinations while on Humira.  Skin exam, screening for cancer -A referral to dermatology was placed today for yearly skin exams while on humira.  Plan: Ambulatory referral to Dermatology  Pain in both hands - X-rays obtained on 10/07/2020 did not show any radiographic progression when compared to x-rays from 09/13/2018.  She has no joint tenderness or synovitis on examination.  She has complete fist formation bilaterally.  Discussed the importance of joint protection and muscle strengthening.  Primary osteoarthritis of both feet: She is not experiencing any discomfort in her feet at this  time.  She is good range of motion of both ankle joints with no tenderness or synovitis.  She is wearing proper fitting shoes.  Other medical conditions are listed as follows:   History of migraine  History of hypothyroidism  Primary insomnia  History of anxiety  Former smoker   Orders: Orders Placed This Encounter  Procedures   CBC with Differential/Platelet   COMPLETE METABOLIC PANEL WITH GFR   Ambulatory referral to Dermatology   No orders of the defined types were placed  in this encounter.    Follow-Up Instructions: Return in 5 months (on 08/10/2022) for Rheumatoid arthritis, Osteoarthritis.   Ofilia Neas, PA-C  Note - This record has been created using Dragon software.  Chart creation errors have been sought, but may not always  have been located. Such creation errors do not reflect on  the standard of medical care.

## 2022-02-25 ENCOUNTER — Other Ambulatory Visit (HOSPITAL_BASED_OUTPATIENT_CLINIC_OR_DEPARTMENT_OTHER): Payer: Self-pay

## 2022-02-25 ENCOUNTER — Other Ambulatory Visit (HOSPITAL_COMMUNITY): Payer: Self-pay

## 2022-02-26 ENCOUNTER — Other Ambulatory Visit (HOSPITAL_BASED_OUTPATIENT_CLINIC_OR_DEPARTMENT_OTHER): Payer: Self-pay

## 2022-02-27 ENCOUNTER — Other Ambulatory Visit (HOSPITAL_BASED_OUTPATIENT_CLINIC_OR_DEPARTMENT_OTHER): Payer: Self-pay

## 2022-03-02 ENCOUNTER — Other Ambulatory Visit (HOSPITAL_COMMUNITY): Payer: Self-pay

## 2022-03-02 ENCOUNTER — Other Ambulatory Visit (HOSPITAL_BASED_OUTPATIENT_CLINIC_OR_DEPARTMENT_OTHER): Payer: Self-pay

## 2022-03-03 ENCOUNTER — Other Ambulatory Visit (HOSPITAL_COMMUNITY): Payer: Self-pay

## 2022-03-03 ENCOUNTER — Other Ambulatory Visit (HOSPITAL_BASED_OUTPATIENT_CLINIC_OR_DEPARTMENT_OTHER): Payer: Self-pay

## 2022-03-03 DIAGNOSIS — N951 Menopausal and female climacteric states: Secondary | ICD-10-CM | POA: Diagnosis not present

## 2022-03-03 DIAGNOSIS — Z90710 Acquired absence of both cervix and uterus: Secondary | ICD-10-CM | POA: Diagnosis not present

## 2022-03-03 MED ORDER — ESTRADIOL 1 MG PO TABS
1.0000 mg | ORAL_TABLET | Freq: Every day | ORAL | 3 refills | Status: DC
  Filled 2022-03-03: qty 90, 90d supply, fill #0
  Filled 2022-06-05: qty 90, 90d supply, fill #1
  Filled 2022-09-07: qty 90, 90d supply, fill #2
  Filled 2022-12-04: qty 90, 90d supply, fill #3

## 2022-03-04 ENCOUNTER — Other Ambulatory Visit (HOSPITAL_BASED_OUTPATIENT_CLINIC_OR_DEPARTMENT_OTHER): Payer: Self-pay

## 2022-03-06 ENCOUNTER — Other Ambulatory Visit (HOSPITAL_BASED_OUTPATIENT_CLINIC_OR_DEPARTMENT_OTHER): Payer: Self-pay

## 2022-03-09 ENCOUNTER — Other Ambulatory Visit (HOSPITAL_BASED_OUTPATIENT_CLINIC_OR_DEPARTMENT_OTHER): Payer: Self-pay

## 2022-03-10 ENCOUNTER — Other Ambulatory Visit (HOSPITAL_BASED_OUTPATIENT_CLINIC_OR_DEPARTMENT_OTHER): Payer: Self-pay

## 2022-03-10 ENCOUNTER — Ambulatory Visit: Payer: 59 | Attending: Physician Assistant | Admitting: Physician Assistant

## 2022-03-10 ENCOUNTER — Encounter: Payer: Self-pay | Admitting: Physician Assistant

## 2022-03-10 VITALS — BP 118/80 | HR 79 | Resp 16 | Ht 64.0 in | Wt 183.4 lb

## 2022-03-10 DIAGNOSIS — Z8669 Personal history of other diseases of the nervous system and sense organs: Secondary | ICD-10-CM

## 2022-03-10 DIAGNOSIS — F5101 Primary insomnia: Secondary | ICD-10-CM | POA: Diagnosis not present

## 2022-03-10 DIAGNOSIS — Z79899 Other long term (current) drug therapy: Secondary | ICD-10-CM

## 2022-03-10 DIAGNOSIS — Z8659 Personal history of other mental and behavioral disorders: Secondary | ICD-10-CM | POA: Diagnosis not present

## 2022-03-10 DIAGNOSIS — M0579 Rheumatoid arthritis with rheumatoid factor of multiple sites without organ or systems involvement: Secondary | ICD-10-CM

## 2022-03-10 DIAGNOSIS — M19071 Primary osteoarthritis, right ankle and foot: Secondary | ICD-10-CM

## 2022-03-10 DIAGNOSIS — Z87891 Personal history of nicotine dependence: Secondary | ICD-10-CM | POA: Diagnosis not present

## 2022-03-10 DIAGNOSIS — M19072 Primary osteoarthritis, left ankle and foot: Secondary | ICD-10-CM

## 2022-03-10 DIAGNOSIS — M79641 Pain in right hand: Secondary | ICD-10-CM

## 2022-03-10 DIAGNOSIS — Z1283 Encounter for screening for malignant neoplasm of skin: Secondary | ICD-10-CM

## 2022-03-10 DIAGNOSIS — Z8639 Personal history of other endocrine, nutritional and metabolic disease: Secondary | ICD-10-CM

## 2022-03-10 DIAGNOSIS — M79642 Pain in left hand: Secondary | ICD-10-CM

## 2022-03-10 MED ORDER — TECHLITE PEN NEEDLES 32G X 6 MM MISC
0 refills | Status: DC
  Filled 2022-03-10: qty 100, 90d supply, fill #0

## 2022-03-10 NOTE — Patient Instructions (Signed)
Standing Labs We placed an order today for your standing lab work.   Please have your standing labs drawn in November and every 3 months   If possible, please have your labs drawn 2 weeks prior to your appointment so that the provider can discuss your results at your appointment.  Please note that you may see your imaging and lab results in [place] before we have reviewed them. We may be awaiting multiple results to interpret others before contacting you. Please allow our office up to 72 hours to thoroughly review all of the results before contacting the office for clarification of your results.  We have open lab daily: Monday through Thursday from 1:30-4:30 PM and Friday from 1:30-4:00 PM at the office of Dr. Bo Merino, [facility].   Please be advised, all patients with office appointments requiring lab work will take precedent over walk-in lab work.  If possible, please come for your lab work on Monday and Friday afternoons, as you may experience shorter wait times. The office is located at [address], [place], [place], [place] [address] No appointment is necessary.   Labs are drawn by [facility]. Please bring your co-pay at the time of your lab draw.  You may receive a bill from [facility] for your lab work.  Please note if you are on Hydroxychloroquine and and an order has been placed for a Hydroxychloroquine level, you will need to have it drawn 4 hours or more after your last dose.  If you wish to have your labs drawn at another location, please call the office 24 hours in advance to send orders.  If you have any questions regarding directions or hours of operation,  please call [phone].   As a reminder, please drink plenty of water prior to coming for your lab work. Thanks!

## 2022-03-11 LAB — CBC WITH DIFFERENTIAL/PLATELET
Absolute Monocytes: 522 cells/uL (ref 200–950)
Basophils Absolute: 30 cells/uL (ref 0–200)
Basophils Relative: 0.5 %
Eosinophils Absolute: 78 cells/uL (ref 15–500)
Eosinophils Relative: 1.3 %
HCT: 36.8 % (ref 35.0–45.0)
Hemoglobin: 13 g/dL (ref 11.7–15.5)
Lymphs Abs: 1914 cells/uL (ref 850–3900)
MCH: 34.7 pg — ABNORMAL HIGH (ref 27.0–33.0)
MCHC: 35.3 g/dL (ref 32.0–36.0)
MCV: 98.1 fL (ref 80.0–100.0)
MPV: 10.2 fL (ref 7.5–12.5)
Monocytes Relative: 8.7 %
Neutro Abs: 3456 cells/uL (ref 1500–7800)
Neutrophils Relative %: 57.6 %
Platelets: 235 10*3/uL (ref 140–400)
RBC: 3.75 10*6/uL — ABNORMAL LOW (ref 3.80–5.10)
RDW: 12.6 % (ref 11.0–15.0)
Total Lymphocyte: 31.9 %
WBC: 6 10*3/uL (ref 3.8–10.8)

## 2022-03-11 LAB — COMPLETE METABOLIC PANEL WITH GFR
AG Ratio: 1.6 (calc) (ref 1.0–2.5)
ALT: 17 U/L (ref 6–29)
AST: 17 U/L (ref 10–35)
Albumin: 4.1 g/dL (ref 3.6–5.1)
Alkaline phosphatase (APISO): 86 U/L (ref 37–153)
BUN: 12 mg/dL (ref 7–25)
CO2: 30 mmol/L (ref 20–32)
Calcium: 9 mg/dL (ref 8.6–10.4)
Chloride: 106 mmol/L (ref 98–110)
Creat: 0.82 mg/dL (ref 0.50–1.03)
Globulin: 2.5 g/dL (calc) (ref 1.9–3.7)
Glucose, Bld: 86 mg/dL (ref 65–99)
Potassium: 4.2 mmol/L (ref 3.5–5.3)
Sodium: 141 mmol/L (ref 135–146)
Total Bilirubin: 0.3 mg/dL (ref 0.2–1.2)
Total Protein: 6.6 g/dL (ref 6.1–8.1)
eGFR: 84 mL/min/{1.73_m2} (ref >=60)

## 2022-03-11 NOTE — Progress Notes (Signed)
CMP WNL. RBC count is borderline low.  Rest of CBC WNL.

## 2022-03-16 ENCOUNTER — Other Ambulatory Visit (HOSPITAL_BASED_OUTPATIENT_CLINIC_OR_DEPARTMENT_OTHER): Payer: Self-pay

## 2022-03-17 ENCOUNTER — Other Ambulatory Visit (HOSPITAL_BASED_OUTPATIENT_CLINIC_OR_DEPARTMENT_OTHER): Payer: Self-pay

## 2022-03-17 ENCOUNTER — Other Ambulatory Visit: Payer: Self-pay | Admitting: Physician Assistant

## 2022-03-17 DIAGNOSIS — D229 Melanocytic nevi, unspecified: Secondary | ICD-10-CM | POA: Diagnosis not present

## 2022-03-17 DIAGNOSIS — D1801 Hemangioma of skin and subcutaneous tissue: Secondary | ICD-10-CM | POA: Diagnosis not present

## 2022-03-17 DIAGNOSIS — B009 Herpesviral infection, unspecified: Secondary | ICD-10-CM | POA: Diagnosis not present

## 2022-03-17 DIAGNOSIS — L821 Other seborrheic keratosis: Secondary | ICD-10-CM | POA: Diagnosis not present

## 2022-03-17 DIAGNOSIS — X32XXXS Exposure to sunlight, sequela: Secondary | ICD-10-CM | POA: Diagnosis not present

## 2022-03-17 DIAGNOSIS — L814 Other melanin hyperpigmentation: Secondary | ICD-10-CM | POA: Diagnosis not present

## 2022-03-17 MED ORDER — VALACYCLOVIR HCL 1 G PO TABS
ORAL_TABLET | ORAL | 11 refills | Status: DC
  Filled 2022-03-17: qty 30, 30d supply, fill #0
  Filled 2022-08-18: qty 30, 30d supply, fill #1

## 2022-03-19 ENCOUNTER — Other Ambulatory Visit: Payer: Self-pay | Admitting: Rheumatology

## 2022-03-19 ENCOUNTER — Other Ambulatory Visit (HOSPITAL_BASED_OUTPATIENT_CLINIC_OR_DEPARTMENT_OTHER): Payer: Self-pay

## 2022-03-19 MED ORDER — METHOTREXATE 2.5 MG PO TABS
20.0000 mg | ORAL_TABLET | ORAL | 0 refills | Status: DC
  Filled 2022-03-19: qty 96, 84d supply, fill #0

## 2022-03-19 NOTE — Telephone Encounter (Signed)
Patient called the office requesting a refill of Methotrexate 2.'5mg'$  be sent to [facility]. Patient states the prescription that was sent to [facility] on 7/20 was never picked up because that is not her usual pharmacy. Patient states she is almost out of medication

## 2022-03-19 NOTE — Telephone Encounter (Signed)
Next Visit: 09/01/2022  Last Visit: 03/10/2022  DX: Rheumatoid arthritis involving multiple sites with positive rheumatoid factor   Current Dose per office note 03/10/2022: methotrexate 2.5 mg 8 tablets every 7 days  Labs: 03/10/2022 CMP WNL. RBC count is borderline low.  Rest of CBC WNL.  Okay to refill MTX?

## 2022-03-25 ENCOUNTER — Other Ambulatory Visit: Payer: Self-pay | Admitting: Physician Assistant

## 2022-03-25 ENCOUNTER — Other Ambulatory Visit (HOSPITAL_COMMUNITY): Payer: Self-pay

## 2022-03-25 MED ORDER — HUMIRA PEN 40 MG/0.8ML ~~LOC~~ PNKT
40.0000 mg | PEN_INJECTOR | SUBCUTANEOUS | 0 refills | Status: DC
Start: 2022-03-25 — End: 2022-06-15
  Filled 2022-03-25: qty 2, 28d supply, fill #0
  Filled 2022-04-22: qty 2, 28d supply, fill #1
  Filled 2022-05-19: qty 2, 28d supply, fill #2

## 2022-03-25 NOTE — Telephone Encounter (Signed)
Next Visit: 09/01/2022  Last Visit: 03/10/2022  Last Fill: 12/26/2021  DX: Rheumatoid arthritis involving multiple sites with positive rheumatoid factor   Current Dose per office note 03/10/2022: Humira 40 mg sq injections every 14 days  Labs: 03/10/2022 CMP WNL. RBC count is borderline low.  Rest of CBC WNL.  TB Gold: 09/10/2021 Neg    Okay to refill Humira?

## 2022-03-31 ENCOUNTER — Other Ambulatory Visit (HOSPITAL_COMMUNITY): Payer: Self-pay

## 2022-04-09 ENCOUNTER — Other Ambulatory Visit (HOSPITAL_BASED_OUTPATIENT_CLINIC_OR_DEPARTMENT_OTHER): Payer: Self-pay

## 2022-04-22 ENCOUNTER — Other Ambulatory Visit (HOSPITAL_COMMUNITY): Payer: Self-pay

## 2022-04-28 ENCOUNTER — Other Ambulatory Visit (HOSPITAL_COMMUNITY): Payer: Self-pay

## 2022-04-30 ENCOUNTER — Other Ambulatory Visit (HOSPITAL_BASED_OUTPATIENT_CLINIC_OR_DEPARTMENT_OTHER): Payer: Self-pay

## 2022-05-05 ENCOUNTER — Ambulatory Visit
Admission: EM | Admit: 2022-05-05 | Discharge: 2022-05-05 | Disposition: A | Discharge status: Home / Self Care | Payer: 59 | Attending: Emergency Medicine | Admitting: Emergency Medicine

## 2022-05-05 DIAGNOSIS — R509 Fever, unspecified: Secondary | ICD-10-CM | POA: Insufficient documentation

## 2022-05-05 DIAGNOSIS — B349 Viral infection, unspecified: Secondary | ICD-10-CM | POA: Insufficient documentation

## 2022-05-05 DIAGNOSIS — Z20822 Contact with and (suspected) exposure to covid-19: Secondary | ICD-10-CM | POA: Diagnosis not present

## 2022-05-05 LAB — RESP PANEL BY RT-PCR (FLU A&B, COVID) ARPGX2
Influenza A by PCR: NEGATIVE
Influenza B by PCR: NEGATIVE
SARS Coronavirus 2 by RT PCR: POSITIVE — AB

## 2022-05-05 NOTE — ED Triage Notes (Signed)
Pt complains of body aches,headache, and sore throat since this morning no otc meds. Denies fever.

## 2022-05-05 NOTE — ED Provider Notes (Signed)
[name]    CSN: [id number] Arrival date & time: 05/05/22  1445    HISTORY   Chief Complaint  Patient presents with   Generalized Body Aches   Sore Throat   Headache   HPI Courtney Leonard is a pleasant, 56 y.o. female who presents to urgent care today. Patient complains of body aches, headache and sore throat that began this morning.  Patient states she does not take any medications to alleviate her symptoms.  Patient denies known fever however has a temperature of 101.8 on arrival with an elevated heart rate.  Patient denies cough, congestion, runny nose, nausea, vomiting, diarrhea.  Patient states she took Humira yesterday and methotrexate the day before that for her known rheumatoid arthritis.  Patient states her husband tested positive for COVID-19 3 days ago and began having symptoms 4 days ago.  Patient states that 2 days ago her home COVID-19 test was negative.  The history is provided by the patient.   Past Medical History:  Diagnosis Date   Anal fissure    Anxiety    Arthritis    Depression    Hypothyroidism    Insomnia    Migraine    Osteoarthritis    Rheumatoid arthritis ([place])    Thyroid disease    Patient Active Problem List   Diagnosis Date Noted   Rheumatoid arthritis involving multiple sites with positive rheumatoid factor ([place]) 06/13/2016   High risk medication use 06/13/2016   Primary osteoarthritis of both feet 06/13/2016   Insomnia 06/13/2016   Acquired hypothyroidism 06/13/2016   Migraine without status migrainosus, not intractable 06/13/2016   Former smoker 06/13/2016   Anxiety 01/08/2011   Past Surgical History:  Procedure Laterality Date   ABDOMINAL HYSTERECTOMY     CESAREAN SECTION     CYSTOSCOPY W/ URETERAL STENT PLACEMENT Left 03/07/2021   Procedure: CYSTOSCOPY WITH STENT REPLACEMENT, RETROGRADE;  Surgeon: Cleon Gustin, MD;  Location: [facility];  Service: Urology;  Laterality: Left;   CYSTOSCOPY WITH URETEROSCOPY AND  STENT PLACEMENT Left 03/19/2021   Procedure: CYSTOSCOPY WITH LEFT RETROGRADE PYELOGRAM URETEROSCOPY AND STONE EXTRACTION AND STENT EXCHANGE;  Surgeon: Lucas Mallow, MD;  Location: [facility];  Service: Urology;  Laterality: Left;   HEMORRHOID SURGERY     KNEE SURGERY     RT knee   laproscopy     LIPOSUCTION  01/15/2021   & excess skin removal   MOUTH SURGERY     ROBOTIC ASSISTED SALPINGO OOPHERECTOMY Left 09/12/2014   Procedure: ROBOTIC ASSISTED Left SALPINGO OOPHORECTOMY/Right Salpingectomy/Pelvic Washings;  Surgeon: Princess Bruins, MD;  Location: [facility];  Service: Gynecology;  Laterality: Left;   UTERINE STENT PLACEMENT  03/07/2021   OB History   No obstetric history on file.    Home Medications    Prior to Admission medications   Medication Sig Start Date End Date Taking? Authorizing Provider  Adalimumab (HUMIRA PEN) 40 MG/0.8ML PNKT INJECT 40 MG INTO THE SKIN EVERY 14 (FOURTEEN) DAYS. 12/26/21 12/26/22  Tresa Garter, MD  Adalimumab (HUMIRA PEN) 40 MG/0.8ML PNKT INJECT 40 MG INTO THE SKIN EVERY 14 (FOURTEEN) DAYS. 03/25/22 03/25/23  Ofilia Neas, PA-C  Atogepant (QULIPTA) 30 MG TABS Take 1 tablet (30 mg total) by mouth daily. 07/17/21     bisacodyl (DULCOLAX) 5 MG EC tablet Take 5 mg by mouth at bedtime.    [provider]  diclofenac sodium (VOLTAREN) 1 % GEL Apply 2 g topically daily as needed (pain). 04/26/18  [provider]  estradiol (ESTRACE) 1 MG tablet Take 1 tablet (1 mg total) by mouth daily. 10/10/74     folic acid (FOLVITE) 1 MG tablet Take 2 tablets (2 mg total) by mouth daily. 12/19/21   Bo Merino, MD  ibuprofen (ADVIL) 200 MG tablet Take 600 mg by mouth every 6 (six) hours as needed for mild pain.    [provider]  Insulin Pen Needle (TECHLITE PEN NEEDLES) 32G X 6 MM MISC Use to inject saxenda 01/29/22     levothyroxine (SYNTHROID) 88 MCG tablet TAKE 1 TABLET BY MOUTH ONCE DAILY 01/26/22     Liraglutide -Weight Management  (SAXENDA) 18 MG/3ML SOPN Inject 0.6 mg into the skin daily and titrate as instructed to a max of 3 mg daily. 01/29/22     LORazepam (ATIVAN) 0.5 MG tablet Take 1 tablet (0.5 mg total) by mouth 2 (two) times daily as needed for anxiety. 01/26/22     methotrexate (RHEUMATREX) 2.5 MG tablet Take 8 tablets (20 mg total) by mouth once a week. Caution: Chemotherapy. Protect from light. 03/19/22   Bo Merino, MD  SUMAtriptan (IMITREX) 50 MG tablet Take 50 mg by mouth every 2 (two) hours as needed for migraine or headache. May repeat in 2 hours if headache persists or recurs.    [provider]  traZODone (DESYREL) 50 MG tablet Take 1/2-1 tablets (25-50 mg total) by mouth at bedtime. Schedule follow up for additional refills 01/26/22     valACYclovir (VALTREX) 1000 MG tablet Take 1 tablet by mouth every day 03/17/22     venlafaxine XR (EFFEXOR-XR) 75 MG 24 hr capsule Take 3 capsules (225 mg total) by mouth daily. Schedule f/u for additional refills 01/26/22       Family History Family History  Problem Relation Age of Onset   Diabetes Mother    Hypertension Mother    Atrial fibrillation Father    Prostate cancer Father    Social History Social History   Tobacco Use   Smoking status: Former    Packs/day: 0.10    Years: 5.00    Total pack years: 0.50    Types: Cigarettes    Quit date: 06/28/2001    Years since quitting: 20.8    Passive exposure: Past   Smokeless tobacco: Never  Vaping Use   Vaping Use: Never used  Substance Use Topics   Alcohol use: No   Drug use: No   Allergies   Clindamycin/lincomycin, Elemental sulfur, and Oxycodone  Review of Systems Review of Systems Pertinent findings revealed after performing a 14 point review of systems has been noted in the history of present illness.  Physical Exam Triage Vital Signs ED Triage Vitals  Enc Vitals Group     BP 05/30/21 0827 (!) 147/82     Pulse Rate 05/30/21 0827 72     Resp 05/30/21 0827 18     Temp 05/30/21  0827 98.3 F (36.8 C)     Temp Source 05/30/21 0827 Oral     SpO2 05/30/21 0827 98 %     Weight --      Height --      Head Circumference --      Peak Flow --      Pain Score 05/30/21 0826 5     Pain Loc --      Pain Edu? --      Excl. in [place]? --   No data found.  Updated Vital Signs BP 112/75 (BP Location:  Left Arm)   Pulse (!) 101   Temp (!) 101.8 F (38.8 C) (Oral)   Resp (!) 22   Ht '5\' 4"'$  (1.626 m)   Wt 164 lb (74.4 kg)   SpO2 100%   BMI 28.15 kg/m   Physical Exam Constitutional:      General: She is awake.     Appearance: Normal appearance. She is well-developed and well-groomed. She is ill-appearing.  HENT:     Head: Normocephalic and atraumatic.     Salivary Glands: Right salivary gland is not diffusely enlarged or tender. Left salivary gland is not diffusely enlarged or tender.     Right Ear: Hearing, tympanic membrane, ear canal and external ear normal.     Left Ear: Hearing, tympanic membrane, ear canal and external ear normal.     Nose: Nose normal. No congestion or rhinorrhea.     Right Sinus: No maxillary sinus tenderness or frontal sinus tenderness.     Left Sinus: No maxillary sinus tenderness.     Mouth/Throat:     Lips: Pink.     Mouth: Mucous membranes are moist.     Pharynx: No pharyngeal swelling, posterior oropharyngeal erythema or uvula swelling.     Tonsils: No tonsillar exudate. 0 on the right. 0 on the left.  Neck:     Trachea: Trachea normal.  Cardiovascular:     Rate and Rhythm: Normal rate and regular rhythm.     Pulses: Normal pulses.     Heart sounds: Normal heart sounds, S1 normal and S2 normal.  Pulmonary:     Effort: Pulmonary effort is normal. No tachypnea, bradypnea, accessory muscle usage, prolonged expiration or respiratory distress.     Breath sounds: Normal breath sounds and air entry. No stridor, decreased air movement or transmitted upper airway sounds. No decreased breath sounds, wheezing, rhonchi or rales.  Abdominal:      General: Abdomen is flat. Bowel sounds are normal.     Palpations: Abdomen is soft.  Musculoskeletal:        General: Normal range of motion.     Cervical back: Full passive range of motion without pain, normal range of motion and neck supple.  Lymphadenopathy:     Cervical: No cervical adenopathy.  Skin:    General: Skin is warm and dry.  Neurological:     General: No focal deficit present.     Mental Status: She is alert and oriented to person, place, and time.     Motor: Motor function is intact.     Coordination: Coordination is intact.     Gait: Gait is intact.     Deep Tendon Reflexes: Reflexes are normal and symmetric.  Psychiatric:        Attention and Perception: Attention and perception normal.        Mood and Affect: Mood and affect normal.        Speech: Speech normal.        Behavior: Behavior normal. Behavior is cooperative.        Thought Content: Thought content normal.     Visual Acuity Right Eye Distance:   Left Eye Distance:   Bilateral Distance:    Right Eye Near:   Left Eye Near:    Bilateral Near:     UC Couse / Diagnostics / Procedures:     Radiology No results found.  Procedures Procedures (including critical care time) EKG  Pending results:  Labs Reviewed  RESP PANEL BY RT-PCR (FLU A&B, COVID) [id number]  Medications Ordered in UC: Medications - No data to display  UC Diagnoses / Final Clinical Impressions(s)   I have reviewed the triage vital signs and the nursing notes.  Pertinent labs & imaging results that were available during my care of the patient were reviewed by me and considered in my medical decision making (see chart for details).    Final diagnoses:  Viral illness  Fever, unspecified fever cause  Exposure to COVID-19 virus   COVID-19 and influenza testing performed.  Patient advised that if either test is positive she would benefit from antiviral therapy.  Last GFR was 84 in June of this year.  Patient currently taking  Humira and methotrexate rheumatoid arthritis.  Conservative care recommended.  Emergency precautions advised.  ED Prescriptions   None    PDMP not reviewed this encounter.  Disposition Upon Discharge:  Condition: stable for discharge home Home: take medications as prescribed; routine discharge instructions as discussed; follow up as advised.  Patient presented with an acute illness with associated systemic symptoms and significant discomfort requiring urgent management. In my opinion, this is a condition that a prudent lay person (someone who possesses an average knowledge of health and medicine) may potentially expect to result in complications if not addressed urgently such as respiratory distress, impairment of bodily function or dysfunction of bodily organs.   Routine symptom specific, illness specific and/or disease specific instructions were discussed with the patient and/or caregiver at length.   As such, the patient has been evaluated and assessed, work-up was performed and treatment was provided in alignment with urgent care protocols and evidence based medicine.  Patient/parent/caregiver has been advised that the patient may require follow up for further testing and treatment if the symptoms continue in spite of treatment, as clinically indicated and appropriate.  If the patient was tested for COVID-19, Influenza and/or RSV, then the patient/parent/guardian was advised to isolate at home pending the results of his/her diagnostic coronavirus test and potentially longer if they're positive. I have also advised pt that if his/her COVID-19 test returns positive, it's recommended to self-isolate for at least 10 days after symptoms first appeared AND until fever-free for 24 hours without fever reducer AND other symptoms have improved or resolved. Discussed self-isolation recommendations as well as instructions for household member/close contacts as per the [facility] and [place] DHHS, and also gave patient  the [place] packet with this information.  Patient/parent/caregiver has been advised to return to the [facility] or PCP in 3-5 days if no better; to PCP or the Emergency Department if new signs and symptoms develop, or if the current signs or symptoms continue to change or worsen for further workup, evaluation and treatment as clinically indicated and appropriate  The patient will follow up with their current PCP if and as advised. If the patient does not currently have a PCP we will assist them in obtaining one.   The patient may need specialty follow up if the symptoms continue, in spite of conservative treatment and management, for further workup, evaluation, consultation and treatment as clinically indicated and appropriate.  Patient/parent/caregiver verbalized understanding and agreement of plan as discussed.  All questions were addressed during visit.  Please see discharge instructions below for further details of plan.  Discharge Instructions:   Discharge Instructions      You received a COVID-19 PCR test today.  The result of your COVID-19 test will be posted to your MyChart once it is complete, typically this takes 6 to 12 hours.  If your COVID-19 PCR test is positive, you will be contacted by phone.  Please let the callback nurse know that I have reviewed your chart and that she would benefit from Paxlovid due to currently being on immunosuppressive medications and that your kidney function is normal at this time.     If your influenza PCR test is positive, you will be contacted by phone and provided with a prescription for Tamiflu, the antiviral medication used to treat influenza.  Please complete full 5-day course of Tamiflu.     If both your COVID-19 and influenza PCR tests are negative, then you can safely assume that your illness is due to one of the many less serious illnesses circulating in our community right now.    Conservative care is recommended with rest, drinking plenty of  clear fluids, eating only when hungry, taking supportive medications for your symptoms and avoiding being around other people.    Please remain at home until you are fever free for 24 hours without the use of antifever medications such as Tylenol and ibuprofen.     Advil, Motrin (ibuprofen): This is a good anti-inflammatory medication which addresses aches, pains and inflammation of the upper airways that causes sinus and nasal congestion as well as in the lower airways which makes your cough feel tight and sometimes burn.  I recommend that you take between 400 to 600 mg every 6-8 hours as needed.      Tylenol (acetaminophen): This is a good fever reducer.  If your body temperature rises above 101.5 as measured with a thermometer, it is recommended that you take 1,000 mg every 8 hours until your temperature falls below 101.5, please not take more than 3,000 mg of acetaminophen either as a separate medication or as in ingredient in an over-the-counter cold/flu preparation within a 24-hour period.      Please follow-up within the next 5-7 days either with your primary care provider or urgent care if your symptoms do not resolve.  If you do not have a primary care provider, we will assist you in finding one.        Thank you for visiting urgent care today.  We appreciate the opportunity to participate in your care.       This office note has been dictated using Museum/gallery curator.  Unfortunately, this method of dictation can sometimes lead to typographical or grammatical errors.  I apologize for your inconvenience in advance if this occurs.  Please do not hesitate to reach out to me if clarification is needed.      Lynden Oxford Scales, PA-C 05/05/22 1530

## 2022-05-05 NOTE — Discharge Instructions (Addendum)
You received a COVID-19 PCR test today.  The result of your COVID-19 test will be posted to your MyChart once it is complete, typically this takes 6 to 12 hours.     If your COVID-19 PCR test is positive, you will be contacted by phone.  Please let the callback nurse know that I have reviewed your chart and that she would benefit from Paxlovid due to currently being on immunosuppressive medications and that your kidney function is normal at this time.     If your influenza PCR test is positive, you will be contacted by phone and provided with a prescription for Tamiflu, the antiviral medication used to treat influenza.  Please complete full 5-day course of Tamiflu.     If both your COVID-19 and influenza PCR tests are negative, then you can safely assume that your illness is due to one of the many less serious illnesses circulating in our community right now.    Conservative care is recommended with rest, drinking plenty of clear fluids, eating only when hungry, taking supportive medications for your symptoms and avoiding being around other people.    Please remain at home until you are fever free for 24 hours without the use of antifever medications such as Tylenol and ibuprofen.     Advil, Motrin (ibuprofen): This is a good anti-inflammatory medication which addresses aches, pains and inflammation of the upper airways that causes sinus and nasal congestion as well as in the lower airways which makes your cough feel tight and sometimes burn.  I recommend that you take between 400 to 600 mg every 6-8 hours as needed.      Tylenol (acetaminophen): This is a good fever reducer.  If your body temperature rises above 101.5 as measured with a thermometer, it is recommended that you take 1,000 mg every 8 hours until your temperature falls below 101.5, please not take more than 3,000 mg of acetaminophen either as a separate medication or as in ingredient in an over-the-counter cold/flu preparation within a  24-hour period.      Please follow-up within the next 5-7 days either with your primary care provider or urgent care if your symptoms do not resolve.  If you do not have a primary care provider, we will assist you in finding one.        Thank you for visiting urgent care today.  We appreciate the opportunity to participate in your care.

## 2022-05-06 ENCOUNTER — Telehealth (HOSPITAL_COMMUNITY): Payer: Self-pay | Admitting: Emergency Medicine

## 2022-05-06 MED ORDER — NIRMATRELVIR/RITONAVIR (PAXLOVID)TABLET: 3.0000 | ORAL_TABLET | Freq: Two times a day (BID) | ORAL | 0 refills | Status: AC

## 2022-05-11 NOTE — Telephone Encounter (Signed)
Please advise the patient to hold off on getting the flu vaccine until she has completely recovered from covid and has no other signs or symptoms of an infection.

## 2022-05-15 ENCOUNTER — Other Ambulatory Visit (HOSPITAL_BASED_OUTPATIENT_CLINIC_OR_DEPARTMENT_OTHER): Payer: Self-pay

## 2022-05-19 ENCOUNTER — Other Ambulatory Visit (HOSPITAL_COMMUNITY): Payer: Self-pay

## 2022-05-25 ENCOUNTER — Other Ambulatory Visit (HOSPITAL_COMMUNITY): Payer: Self-pay

## 2022-06-05 ENCOUNTER — Other Ambulatory Visit (HOSPITAL_BASED_OUTPATIENT_CLINIC_OR_DEPARTMENT_OTHER): Payer: Self-pay

## 2022-06-11 ENCOUNTER — Other Ambulatory Visit (HOSPITAL_COMMUNITY): Payer: Self-pay

## 2022-06-11 ENCOUNTER — Other Ambulatory Visit (HOSPITAL_BASED_OUTPATIENT_CLINIC_OR_DEPARTMENT_OTHER): Payer: Self-pay

## 2022-06-11 MED ORDER — LORAZEPAM 0.5 MG PO TABS
0.5000 mg | ORAL_TABLET | Freq: Two times a day (BID) | ORAL | 0 refills | Status: DC | PRN
  Filled 2022-06-11: qty 30, 15d supply, fill #0

## 2022-06-12 ENCOUNTER — Other Ambulatory Visit (HOSPITAL_BASED_OUTPATIENT_CLINIC_OR_DEPARTMENT_OTHER): Payer: Self-pay

## 2022-06-12 ENCOUNTER — Other Ambulatory Visit: Payer: Self-pay | Admitting: Rheumatology

## 2022-06-12 DIAGNOSIS — Z79899 Other long term (current) drug therapy: Secondary | ICD-10-CM

## 2022-06-12 MED ORDER — METHOTREXATE SODIUM 2.5 MG PO TABS
ORAL_TABLET | ORAL | 0 refills | Status: DC
  Filled 2022-06-12: qty 96, 84d supply, fill #0

## 2022-06-12 NOTE — Telephone Encounter (Signed)
Patient called the office stating she has a question about a medication and requests a call back.

## 2022-06-12 NOTE — Telephone Encounter (Signed)
Patient requested a refill of methotrexate to [facility].   Next Visit: 09/01/2022  Last Visit: 03/10/2022  Last Fill: 03/19/2022  DX: Rheumatoid arthritis involving multiple sites with positive rheumatoid factor   Current Dose per office note on 03/10/2022: methotrexate 2.5 mg 8 tablets every 7 days   Labs: 03/10/2022 CMP WNL. RBC count is borderline low.  Rest of CBC WNL.   Patient is currently traveling and will update labs when she is back in town.   Okay to refill methotrexate?

## 2022-06-15 ENCOUNTER — Other Ambulatory Visit: Payer: Self-pay | Admitting: Physician Assistant

## 2022-06-15 ENCOUNTER — Other Ambulatory Visit (HOSPITAL_COMMUNITY): Payer: Self-pay

## 2022-06-15 MED ORDER — HUMIRA PEN 40 MG/0.8ML ~~LOC~~ PNKT
40.0000 mg | PEN_INJECTOR | SUBCUTANEOUS | 0 refills | Status: DC
  Filled 2022-06-15: qty 2, 28d supply, fill #0
  Filled 2022-07-15: qty 2, 28d supply, fill #1

## 2022-06-15 NOTE — Telephone Encounter (Signed)
Next Visit: 09/01/2022  Last Visit: 03/10/2022  Last Fill: 03/25/2022  DP:[id number] arthritis involving multiple sites with positive rheumatoid factor   Current Dose per office note on 03/10/2022: Humira 40 mg sq injections every 14 days   Labs: 03/10/2022 CMP WNL. RBC count is borderline low.  Rest of CBC WNL.   TB Gold: 09/10/2021 negative    Patient is currently traveling and will update labs when she is back in town.    Okay to refill humira?

## 2022-06-16 ENCOUNTER — Other Ambulatory Visit (HOSPITAL_COMMUNITY): Payer: Self-pay

## 2022-06-16 ENCOUNTER — Telehealth: Payer: Self-pay | Admitting: Pharmacy Technician

## 2022-06-16 NOTE — Telephone Encounter (Signed)
Patient Advocate Encounter  Received notification from [facility] that prior authorization for HUMIRA '40MG'$  is required.   PA submitted on 11.14.23 Key [id number] Status is pending    Luciano Cutter, CPhT Patient Advocate Phone: [phone]

## 2022-06-17 ENCOUNTER — Other Ambulatory Visit (HOSPITAL_COMMUNITY): Payer: Self-pay

## 2022-06-18 ENCOUNTER — Other Ambulatory Visit (HOSPITAL_COMMUNITY): Payer: Self-pay

## 2022-06-19 NOTE — Telephone Encounter (Signed)
Received notification from [facility] regarding a prior authorization for HUMIRA. Authorization has been APPROVED from 06/18/2022 to 06/18/2023  Patient must continue to fill through [place]: [phone]   PA Case ID: [id number]  Knox Saliva, PharmD, MPH, BCPS, CPP Clinical Pharmacist (Rheumatology and Pulmonology)

## 2022-07-06 ENCOUNTER — Other Ambulatory Visit (HOSPITAL_BASED_OUTPATIENT_CLINIC_OR_DEPARTMENT_OTHER): Payer: Self-pay

## 2022-07-14 ENCOUNTER — Other Ambulatory Visit (HOSPITAL_BASED_OUTPATIENT_CLINIC_OR_DEPARTMENT_OTHER): Payer: Self-pay

## 2022-07-14 DIAGNOSIS — F331 Major depressive disorder, recurrent, moderate: Secondary | ICD-10-CM | POA: Diagnosis not present

## 2022-07-14 DIAGNOSIS — E039 Hypothyroidism, unspecified: Secondary | ICD-10-CM | POA: Diagnosis not present

## 2022-07-14 DIAGNOSIS — F419 Anxiety disorder, unspecified: Secondary | ICD-10-CM | POA: Diagnosis not present

## 2022-07-14 DIAGNOSIS — Z87891 Personal history of nicotine dependence: Secondary | ICD-10-CM | POA: Diagnosis not present

## 2022-07-14 DIAGNOSIS — Z1231 Encounter for screening mammogram for malignant neoplasm of breast: Secondary | ICD-10-CM | POA: Diagnosis not present

## 2022-07-14 DIAGNOSIS — Z0000 Encounter for general adult medical examination without abnormal findings: Secondary | ICD-10-CM | POA: Diagnosis not present

## 2022-07-14 DIAGNOSIS — F5109 Other insomnia not due to a substance or known physiological condition: Secondary | ICD-10-CM | POA: Diagnosis not present

## 2022-07-14 DIAGNOSIS — E038 Other specified hypothyroidism: Secondary | ICD-10-CM | POA: Diagnosis not present

## 2022-07-14 MED ORDER — LORAZEPAM 0.5 MG PO TABS
0.5000 mg | ORAL_TABLET | Freq: Two times a day (BID) | ORAL | 3 refills | Status: DC | PRN
  Filled 2022-07-14: qty 30, 15d supply, fill #0
  Filled 2022-10-01: qty 30, 15d supply, fill #1
  Filled 2022-12-04: qty 30, 15d supply, fill #2

## 2022-07-14 MED ORDER — SAXENDA 18 MG/3ML ~~LOC~~ SOPN
PEN_INJECTOR | SUBCUTANEOUS | 0 refills | Status: DC
  Filled 2022-07-14: qty 15, 30d supply, fill #0

## 2022-07-14 MED ORDER — TRAZODONE HCL 50 MG PO TABS
25.0000 mg | ORAL_TABLET | Freq: Every day | ORAL | 1 refills | Status: DC
  Filled 2022-07-14: qty 90, 90d supply, fill #0
  Filled 2022-10-07: qty 90, 90d supply, fill #1

## 2022-07-14 MED ORDER — VENLAFAXINE HCL ER 75 MG PO CP24
225.0000 mg | ORAL_CAPSULE | Freq: Every day | ORAL | 1 refills | Status: DC
  Filled 2022-07-14: qty 270, 90d supply, fill #0
  Filled 2022-10-07: qty 270, 90d supply, fill #1

## 2022-07-15 ENCOUNTER — Other Ambulatory Visit: Payer: Self-pay

## 2022-07-17 ENCOUNTER — Other Ambulatory Visit: Payer: Self-pay

## 2022-07-20 ENCOUNTER — Other Ambulatory Visit: Payer: Self-pay

## 2022-07-20 ENCOUNTER — Other Ambulatory Visit (HOSPITAL_COMMUNITY): Payer: Self-pay

## 2022-08-05 DIAGNOSIS — Z1231 Encounter for screening mammogram for malignant neoplasm of breast: Secondary | ICD-10-CM | POA: Diagnosis not present

## 2022-08-05 DIAGNOSIS — R928 Other abnormal and inconclusive findings on diagnostic imaging of breast: Secondary | ICD-10-CM | POA: Diagnosis not present

## 2022-08-11 ENCOUNTER — Other Ambulatory Visit: Payer: Self-pay | Admitting: Physician Assistant

## 2022-08-11 ENCOUNTER — Other Ambulatory Visit (HOSPITAL_COMMUNITY): Payer: Self-pay

## 2022-08-11 MED ORDER — ADALIMUMAB 40 MG/0.8ML ~~LOC~~ AJKT
40.0000 mg | AUTO-INJECTOR | SUBCUTANEOUS | 0 refills | Status: DC
  Filled 2022-08-11: qty 2, 28d supply, fill #0
  Filled 2022-09-09: qty 2, 28d supply, fill #1
  Filled 2022-10-06: qty 2, 28d supply, fill #2

## 2022-08-11 NOTE — Telephone Encounter (Signed)
Next Visit: 09/01/2022  Last Visit: 03/10/2022  Last Fill: 06/15/2022  CX:[id number] arthritis involving multiple sites with positive rheumatoid factor   Current Dose per office note 03/10/2022: Humira 40 mg subcutaneous injections every 14 days,   Labs: 07/14/2022 RBC 3.80, MCV 103.4, MCH 34.3, Total Protein 6.3  TB Gold: 09/10/2021 Neg    Okay to refill Humira?

## 2022-08-14 DIAGNOSIS — R928 Other abnormal and inconclusive findings on diagnostic imaging of breast: Secondary | ICD-10-CM | POA: Diagnosis not present

## 2022-08-14 DIAGNOSIS — N6489 Other specified disorders of breast: Secondary | ICD-10-CM | POA: Diagnosis not present

## 2022-08-17 ENCOUNTER — Other Ambulatory Visit: Payer: Self-pay

## 2022-08-18 ENCOUNTER — Other Ambulatory Visit (HOSPITAL_BASED_OUTPATIENT_CLINIC_OR_DEPARTMENT_OTHER): Payer: Self-pay

## 2022-08-18 ENCOUNTER — Other Ambulatory Visit (HOSPITAL_COMMUNITY): Payer: Self-pay

## 2022-08-18 NOTE — Progress Notes (Signed)
Office Visit Note  Patient: Courtney Leonard             Date of Birth: 1966-05-03           MRN: [id number]             PCP: Deborah Chalk, FNP Referring: Deborah Chalk, FNP Visit Date: 09/01/2022 Occupation: '@GUAROCC'$ @  Subjective:  Medication management  History of Present Illness: Courtney Leonard is a 57 y.o. female with history of seropositive rheumatoid arthritis.  She denies any flares of rheumatoid arthritis since the last visit.  She states the last time she tried to taper off Humira she had a flare.  She states she has been taking Humira 40 mg subcu every other week and 8 tablets p.o. weekly along with folic acid.  She has minimal morning stiffness.  She reports dry mouth related to medication use.  She states she had some discomfort in the right trochanteric region recently.    Activities of Daily Living:  Patient reports morning stiffness for 5 minutes.   Patient Denies nocturnal pain.  Difficulty dressing/grooming: Denies Difficulty climbing stairs: Denies Difficulty getting out of chair: Denies Difficulty using hands for taps, buttons, cutlery, and/or writing: Denies  Review of Systems  Constitutional:  Negative for fatigue.  HENT:  Positive for mouth dryness. Negative for mouth sores.   Eyes:  Negative for dryness.  Respiratory:  Negative for shortness of breath.   Cardiovascular:  Negative for palpitations and hypertension.  Gastrointestinal:  Negative for blood in stool, constipation and diarrhea.  Endocrine: Negative for increased urination.  Genitourinary:  Negative for difficulty urinating.  Musculoskeletal:  Positive for myalgias, morning stiffness and myalgias. Negative for joint pain, joint pain and joint swelling.  Skin:  Negative for color change, rash and sensitivity to sunlight.  Allergic/Immunologic: Negative for susceptible to infections.  Neurological:  Negative for dizziness.  Hematological:  Negative for swollen glands.   Psychiatric/Behavioral:  Positive for depressed mood. Negative for sleep disturbance. The patient is nervous/anxious.     PMFS History:  Patient Active Problem List   Diagnosis Date Noted   Rheumatoid arthritis involving multiple sites with positive rheumatoid factor ([place]) 06/13/2016   High risk medication use 06/13/2016   Primary osteoarthritis of both feet 06/13/2016   Insomnia 06/13/2016   Acquired hypothyroidism 06/13/2016   Migraine without status migrainosus, not intractable 06/13/2016   Former smoker 06/13/2016   Anxiety 01/08/2011    Past Medical History:  Diagnosis Date   Anal fissure    Anxiety    Arthritis    Depression    Hypothyroidism    Insomnia    Migraine    Osteoarthritis    Rheumatoid arthritis ([place])    Thyroid disease     Family History  Problem Relation Age of Onset   Diabetes Mother    Hypertension Mother    Atrial fibrillation Father    Prostate cancer Father    Past Surgical History:  Procedure Laterality Date   ABDOMINAL HYSTERECTOMY     CESAREAN SECTION     CYSTOSCOPY W/ URETERAL STENT PLACEMENT Left 03/07/2021   Procedure: CYSTOSCOPY WITH STENT REPLACEMENT, RETROGRADE;  Surgeon: Cleon Gustin, MD;  Location: [facility];  Service: Urology;  Laterality: Left;   CYSTOSCOPY WITH URETEROSCOPY AND STENT PLACEMENT Left 03/19/2021   Procedure: CYSTOSCOPY WITH LEFT RETROGRADE PYELOGRAM URETEROSCOPY AND STONE EXTRACTION AND STENT EXCHANGE;  Surgeon: Lucas Mallow, MD;  Location: [facility];  Service: Urology;  Laterality:  Left;   HEMORRHOID SURGERY     KNEE SURGERY     RT knee   laproscopy     LIPOSUCTION  01/15/2021   & excess skin removal   MOUTH SURGERY     ROBOTIC ASSISTED SALPINGO OOPHERECTOMY Left 09/12/2014   Procedure: ROBOTIC ASSISTED Left SALPINGO OOPHORECTOMY/Right Salpingectomy/Pelvic Washings;  Surgeon: Princess Bruins, MD;  Location: [facility];  Service: Gynecology;  Laterality: Left;   UTERINE STENT PLACEMENT  03/07/2021    Social History   Social History Narrative   Not on file   Immunization History  Administered Date(s) Administered   Hepatitis B 11/08/2009, 12/11/2009   Influenza,trivalent, recombinat, inj, PF 05/17/2006, 05/03/2013   Influenza-Unspecified 05/21/2017, 05/01/2018   PFIZER Comirnaty(Gray Top)Covid-19 Tri-Sucrose Vaccine 08/24/2019, 09/18/2019, 08/30/2020   PFIZER(Purple Top)SARS-COV-2 Vaccination 08/04/2019, 08/25/2019   Pneumococcal Polysaccharide-23 04/10/2016   Td 06/26/2003   Tdap 03/06/2010, 01/13/2016   Varicella 12/25/2009, 01/24/2010   Zoster Recombinat (Shingrix) 09/24/2018, 03/17/2019     Objective: Vital Signs: Resp 15   Ht '5\' 4"'$  (1.626 m)   Wt 156 lb (70.8 kg)   BMI 26.78 kg/m    Physical Exam Vitals and nursing note reviewed.  Constitutional:      Appearance: She is well-developed.  HENT:     Head: Normocephalic and atraumatic.  Eyes:     Conjunctiva/sclera: Conjunctivae normal.  Cardiovascular:     Rate and Rhythm: Normal rate and regular rhythm.     Heart sounds: Normal heart sounds.  Pulmonary:     Effort: Pulmonary effort is normal.     Breath sounds: Normal breath sounds.  Abdominal:     General: Bowel sounds are normal.     Palpations: Abdomen is soft.  Musculoskeletal:     Cervical back: Normal range of motion.  Lymphadenopathy:     Cervical: No cervical adenopathy.  Skin:    General: Skin is warm and dry.     Capillary Refill: Capillary refill takes less than 2 seconds.  Neurological:     Mental Status: She is alert and oriented to person, place, and time.  Psychiatric:        Behavior: Behavior normal.      Musculoskeletal Exam: Cervical, thoracic and lumbar spine were in good range of motion.  Shoulders, elbows, wrist joints, MCPs PIPs and DIPs Juengel range of motion with no synovitis.  Hip joints and knee joints were in good range of motion.  There was no tenderness over ankles or MTPs.  CDAI Exam: CDAI Score: -- Patient Global:  2 mm; Provider Global: 2 mm Swollen: --; Tender: -- Joint Exam 09/01/2022   No joint exam has been documented for this visit   There is currently no information documented on the homunculus. Go to the Rheumatology activity and complete the homunculus joint exam.  Investigation: No additional findings.  Imaging: No results found.  Recent Labs: Lab Results  Component Value Date   WBC 6.0 03/10/2022   HGB 13.0 03/10/2022   PLT 235 03/10/2022   NA 141 03/10/2022   K 4.2 03/10/2022   CL 106 03/10/2022   CO2 30 03/10/2022   GLUCOSE 86 03/10/2022   BUN 12 03/10/2022   CREATININE 0.82 03/10/2022   BILITOT 0.3 03/10/2022   ALKPHOS 85 03/05/2021   AST 17 03/10/2022   ALT 17 03/10/2022   PROT 6.6 03/10/2022   ALBUMIN 4.9 03/05/2021   CALCIUM 9.0 03/10/2022   GFRAA 79 12/12/2020   QFTBGOLDPLUS NEGATIVE 09/10/2021    Speciality Comments: No  specialty comments available.  Procedures:  No procedures performed Allergies: Clindamycin/lincomycin, Elemental sulfur, and Oxycodone   Assessment / Plan:     Visit Diagnoses: Rheumatoid arthritis involving multiple sites with positive rheumatoid factor (HCC)-patient denies having a flare of rheumatoid arthritis.  She had no synovitis on my examination today.  She has been taking Humira and methotrexate on a regular basis.  High risk medication use - Humira 40 mg sq injections every 14 days, methotrexate 2.5 mg 8 tablets every 7 days, and folic acid 1 mg 2 tablets daily - Plan: CBC with Differential/Platelet, COMPLETE METABOLIC PANEL WITH GFR, QuantiFERON-TB Gold Plus in March and then CBC and CMP every 3 months.  Information on immunization was placed in the AVS.  She was advised to hold Humira and methotrexate if she develops an infection resume after the infection resolves.  Use of sunscreen was emphasized.  Pain in both hands -she denies any discomfort in her hands today.  X-rays obtained on 10/07/2020 did not show any radiographic  progression when compared to x-rays from 09/13/2018.  Primary osteoarthritis of both feet-she had noted numbness over MTPs or ankles.  History of migraine  History of hypothyroidism  History of anxiety-she is on Effexor  Primary insomnia-she takes trazodone at times.  Former smoker  Skin exam, screening for cancer -patient states that she recently had skin examination by the dermatologist.  Orders: Orders Placed This Encounter  Procedures   CBC with Differential/Platelet   COMPLETE METABOLIC PANEL WITH GFR   QuantiFERON-TB Gold Plus   No orders of the defined types were placed in this encounter.    Follow-Up Instructions: Return in about 5 months (around 01/31/2023) for Osteoarthritis, Rheumatoid arthritis.   Bo Merino, MD  Note - This record has been created using Editor, commissioning.  Chart creation errors have been sought, but may not always  have been located. Such creation errors do not reflect on  the standard of medical care.

## 2022-08-20 ENCOUNTER — Other Ambulatory Visit (HOSPITAL_BASED_OUTPATIENT_CLINIC_OR_DEPARTMENT_OTHER): Payer: Self-pay

## 2022-08-21 ENCOUNTER — Other Ambulatory Visit (HOSPITAL_BASED_OUTPATIENT_CLINIC_OR_DEPARTMENT_OTHER): Payer: Self-pay

## 2022-08-21 ENCOUNTER — Encounter (HOSPITAL_BASED_OUTPATIENT_CLINIC_OR_DEPARTMENT_OTHER): Payer: Self-pay

## 2022-09-01 ENCOUNTER — Ambulatory Visit: Payer: Commercial Managed Care - PPO | Attending: Rheumatology | Admitting: Rheumatology

## 2022-09-01 ENCOUNTER — Encounter: Payer: Self-pay | Admitting: Rheumatology

## 2022-09-01 VITALS — BP 103/72 | HR 66 | Resp 15 | Ht 64.0 in | Wt 156.0 lb

## 2022-09-01 DIAGNOSIS — Z1283 Encounter for screening for malignant neoplasm of skin: Secondary | ICD-10-CM | POA: Diagnosis not present

## 2022-09-01 DIAGNOSIS — M79641 Pain in right hand: Secondary | ICD-10-CM

## 2022-09-01 DIAGNOSIS — F5101 Primary insomnia: Secondary | ICD-10-CM | POA: Diagnosis not present

## 2022-09-01 DIAGNOSIS — Z79899 Other long term (current) drug therapy: Secondary | ICD-10-CM

## 2022-09-01 DIAGNOSIS — Z8669 Personal history of other diseases of the nervous system and sense organs: Secondary | ICD-10-CM

## 2022-09-01 DIAGNOSIS — Z8659 Personal history of other mental and behavioral disorders: Secondary | ICD-10-CM

## 2022-09-01 DIAGNOSIS — M19071 Primary osteoarthritis, right ankle and foot: Secondary | ICD-10-CM

## 2022-09-01 DIAGNOSIS — M19072 Primary osteoarthritis, left ankle and foot: Secondary | ICD-10-CM

## 2022-09-01 DIAGNOSIS — Z8639 Personal history of other endocrine, nutritional and metabolic disease: Secondary | ICD-10-CM | POA: Diagnosis not present

## 2022-09-01 DIAGNOSIS — Z87891 Personal history of nicotine dependence: Secondary | ICD-10-CM | POA: Diagnosis not present

## 2022-09-01 DIAGNOSIS — M79642 Pain in left hand: Secondary | ICD-10-CM

## 2022-09-01 DIAGNOSIS — M0579 Rheumatoid arthritis with rheumatoid factor of multiple sites without organ or systems involvement: Secondary | ICD-10-CM

## 2022-09-01 NOTE — Patient Instructions (Signed)
Standing Labs We placed an order today for your standing lab work.   Please have your standing labs drawn in the first week of March and then every 3 months TB Gold with your next labs  Please have your labs drawn 2 weeks prior to your appointment so that the provider can discuss your lab results at your appointment.  Please note that you may see your imaging and lab results in [facility] before we have reviewed them. We will contact you once all results are reviewed. Please allow our office up to 72 hours to thoroughly review all of the results before contacting the office for clarification of your results.  Lab hours are:   Monday through Thursday from 8:00 am -12:30 pm and 1:00 pm-5:00 pm and Friday from 8:00 am-12:00 pm.  Please be advised, all patients with office appointments requiring lab work will take precedent over walk-in lab work.   Labs are drawn by [facility]. Please bring your co-pay at the time of your lab draw.  You may receive a bill from [facility] for your lab work.  Please note if you are on Hydroxychloroquine and and an order has been placed for a Hydroxychloroquine level, you will need to have it drawn 4 hours or more after your last dose.  If you wish to have your labs drawn at another location, please call the office 24 hours in advance so we can fax the orders.  The office is located at [address], [place], [place],  [address] No appointment is necessary.    If you have any questions regarding directions or hours of operation,  please call [phone].   As a reminder, please drink plenty of water prior to coming for your lab work. Thanks!   Vaccines You are taking a medication(s) that can suppress your immune system.  The following immunizations are recommended: Flu annually Covid-19  RSV Td/Tdap (tetanus, diphtheria, pertussis) every 10 years Pneumonia (Prevnar 15 then Pneumovax 23 at least 1 year apart.  Alternatively, can take Prevnar 20 without  needing additional dose) Shingrix: 2 doses from 4 weeks to 6 months apart  Please check with your PCP to make sure you are up to date.   If you have signs or symptoms of an infection or start antibiotics: First, call your PCP for workup of your infection. Hold your medication through the infection, until you complete your antibiotics, and until symptoms resolve if you take the following: Injectable medication (Actemra, Benlysta, Cimzia, Cosentyx, Enbrel, Humira, Kevzara, Orencia, Remicade, Simponi, Stelara, Taltz, Tremfya) Methotrexate Leflunomide (Arava) Mycophenolate (Cellcept) Morrie Sheldon, Olumiant, or Rinvoq  Please get an annual skin examination while you are on Humira to screen for skin cancer

## 2022-09-07 ENCOUNTER — Other Ambulatory Visit (HOSPITAL_BASED_OUTPATIENT_CLINIC_OR_DEPARTMENT_OTHER): Payer: Self-pay

## 2022-09-09 ENCOUNTER — Other Ambulatory Visit (HOSPITAL_BASED_OUTPATIENT_CLINIC_OR_DEPARTMENT_OTHER): Payer: Self-pay

## 2022-09-09 ENCOUNTER — Other Ambulatory Visit (HOSPITAL_COMMUNITY): Payer: Self-pay

## 2022-09-09 ENCOUNTER — Other Ambulatory Visit: Payer: Self-pay

## 2022-09-10 ENCOUNTER — Other Ambulatory Visit (HOSPITAL_BASED_OUTPATIENT_CLINIC_OR_DEPARTMENT_OTHER): Payer: Self-pay

## 2022-09-10 MED ORDER — SAXENDA 18 MG/3ML ~~LOC~~ SOPN
0.6000 mg | PEN_INJECTOR | Freq: Every day | SUBCUTANEOUS | 0 refills | Status: DC
  Filled 2022-09-10: qty 15, 30d supply, fill #0

## 2022-09-11 ENCOUNTER — Other Ambulatory Visit (HOSPITAL_BASED_OUTPATIENT_CLINIC_OR_DEPARTMENT_OTHER): Payer: Self-pay

## 2022-09-11 ENCOUNTER — Other Ambulatory Visit: Payer: Self-pay | Admitting: Rheumatology

## 2022-09-11 MED ORDER — METHOTREXATE SODIUM 2.5 MG PO TABS
ORAL_TABLET | ORAL | 0 refills | Status: DC
  Filled 2022-09-11: qty 96, 84d supply, fill #0

## 2022-09-11 NOTE — Telephone Encounter (Signed)
Next Visit: 02/17/2023  Last Visit: 09/01/2022  Last Fill: 06/12/2022  DX: Rheumatoid arthritis involving multiple sites with positive rheumatoid factor   Current Dose per office note 09/01/2022: methotrexate 2.5 mg 8 tablets every 7 days   Labs: 07/14/2022 Total Protein 6.3, RBC 3.80, MCV 103.4, MCH 34.3  Okay to refill MTX?

## 2022-10-01 ENCOUNTER — Other Ambulatory Visit (HOSPITAL_BASED_OUTPATIENT_CLINIC_OR_DEPARTMENT_OTHER): Payer: Self-pay

## 2022-10-05 ENCOUNTER — Other Ambulatory Visit: Payer: Self-pay | Admitting: *Deleted

## 2022-10-05 DIAGNOSIS — Z79899 Other long term (current) drug therapy: Secondary | ICD-10-CM

## 2022-10-06 ENCOUNTER — Other Ambulatory Visit (HOSPITAL_COMMUNITY): Payer: Self-pay

## 2022-10-06 NOTE — Progress Notes (Signed)
CBC and CMP are stable.

## 2022-10-07 ENCOUNTER — Other Ambulatory Visit (HOSPITAL_BASED_OUTPATIENT_CLINIC_OR_DEPARTMENT_OTHER): Payer: Self-pay

## 2022-10-07 LAB — COMPLETE METABOLIC PANEL WITH GFR
AG Ratio: 2 (calc) (ref 1.0–2.5)
ALT: 17 U/L (ref 6–29)
AST: 15 U/L (ref 10–35)
Albumin: 4.3 g/dL (ref 3.6–5.1)
Alkaline phosphatase (APISO): 77 U/L (ref 37–153)
BUN: 14 mg/dL (ref 7–25)
CO2: 30 mmol/L (ref 20–32)
Calcium: 9.3 mg/dL (ref 8.6–10.4)
Chloride: 106 mmol/L (ref 98–110)
Creat: 0.82 mg/dL (ref 0.50–1.03)
Globulin: 2.2 g/dL (calc) (ref 1.9–3.7)
Glucose, Bld: 107 mg/dL — ABNORMAL HIGH (ref 65–99)
Potassium: 4.6 mmol/L (ref 3.5–5.3)
Sodium: 141 mmol/L (ref 135–146)
Total Bilirubin: 0.4 mg/dL (ref 0.2–1.2)
Total Protein: 6.5 g/dL (ref 6.1–8.1)
eGFR: 84 mL/min/{1.73_m2} (ref >=60)

## 2022-10-07 LAB — CBC WITH DIFFERENTIAL/PLATELET
Absolute Monocytes: 428 cells/uL (ref 200–950)
Basophils Absolute: 41 cells/uL (ref 0–200)
Basophils Relative: 0.8 %
Eosinophils Absolute: 51 cells/uL (ref 15–500)
Eosinophils Relative: 1 %
HCT: 38.5 % (ref 35.0–45.0)
Hemoglobin: 13.1 g/dL (ref 11.7–15.5)
Lymphs Abs: 1729 cells/uL (ref 850–3900)
MCH: 34.4 pg — ABNORMAL HIGH (ref 27.0–33.0)
MCHC: 34 g/dL (ref 32.0–36.0)
MCV: 101 fL — ABNORMAL HIGH (ref 80.0–100.0)
MPV: 10.4 fL (ref 7.5–12.5)
Monocytes Relative: 8.4 %
Neutro Abs: 2851 cells/uL (ref 1500–7800)
Neutrophils Relative %: 55.9 %
Platelets: 271 10*3/uL (ref 140–400)
RBC: 3.81 10*6/uL (ref 3.80–5.10)
RDW: 13 % (ref 11.0–15.0)
Total Lymphocyte: 33.9 %
WBC: 5.1 10*3/uL (ref 3.8–10.8)

## 2022-10-07 LAB — QUANTIFERON-TB GOLD PLUS
Mitogen-NIL: 8.87 IU/mL
NIL: 0.06 IU/mL
QuantiFERON-TB Gold Plus: NEGATIVE
TB1-NIL: <0 IU/mL
TB2-NIL: 0.04 IU/mL

## 2022-10-08 NOTE — Progress Notes (Signed)
TB Gold is negative.

## 2022-10-09 ENCOUNTER — Other Ambulatory Visit (HOSPITAL_COMMUNITY): Payer: Self-pay

## 2022-10-16 ENCOUNTER — Other Ambulatory Visit (HOSPITAL_BASED_OUTPATIENT_CLINIC_OR_DEPARTMENT_OTHER): Payer: Self-pay

## 2022-11-04 ENCOUNTER — Other Ambulatory Visit: Payer: Self-pay

## 2022-11-05 ENCOUNTER — Other Ambulatory Visit: Payer: Self-pay | Admitting: Rheumatology

## 2022-11-05 ENCOUNTER — Other Ambulatory Visit (HOSPITAL_COMMUNITY): Payer: Self-pay

## 2022-11-05 ENCOUNTER — Other Ambulatory Visit: Payer: Self-pay

## 2022-11-05 MED ORDER — HUMIRA (2 PEN) 40 MG/0.8ML ~~LOC~~ AJKT
40.0000 mg | AUTO-INJECTOR | SUBCUTANEOUS | 0 refills | Status: DC
  Filled 2022-11-05: qty 2, 28d supply, fill #0
  Filled 2022-11-27: qty 2, 28d supply, fill #1
  Filled 2022-12-25: qty 2, 28d supply, fill #2

## 2022-11-05 NOTE — Telephone Encounter (Signed)
Last Fill: 08/11/2022  Labs: 10/05/2022 CBC and CMP are stable.   TB Gold: 10/05/2022 Neg   Next Visit: 02/17/2023  Last Visit: 09/01/2022  [id number] arthritis involving multiple sites with positive rheumatoid factor   Current Dose per office note 09/01/2022: Humira 40 mg sq injections every 14 days   Okay to refill Humira?

## 2022-11-06 ENCOUNTER — Other Ambulatory Visit (HOSPITAL_COMMUNITY): Payer: Self-pay

## 2022-11-06 ENCOUNTER — Other Ambulatory Visit: Payer: Self-pay

## 2022-11-25 ENCOUNTER — Other Ambulatory Visit (HOSPITAL_BASED_OUTPATIENT_CLINIC_OR_DEPARTMENT_OTHER): Payer: Self-pay

## 2022-11-27 ENCOUNTER — Other Ambulatory Visit (HOSPITAL_COMMUNITY): Payer: Self-pay

## 2022-12-02 ENCOUNTER — Other Ambulatory Visit (HOSPITAL_COMMUNITY): Payer: Self-pay

## 2022-12-04 ENCOUNTER — Other Ambulatory Visit (HOSPITAL_BASED_OUTPATIENT_CLINIC_OR_DEPARTMENT_OTHER): Payer: Self-pay

## 2022-12-04 ENCOUNTER — Other Ambulatory Visit: Payer: Self-pay | Admitting: Physician Assistant

## 2022-12-04 MED ORDER — METHOTREXATE SODIUM 2.5 MG PO TABS
ORAL_TABLET | ORAL | 0 refills | Status: DC
  Filled 2022-12-04: qty 96, 84d supply, fill #0

## 2022-12-04 NOTE — Telephone Encounter (Signed)
Last Fill: 09/11/2022  Labs: 10/05/2022 CBC and CMP are stable.   Next Visit: 02/17/2023  Last Visit: 09/01/2022  DX: Rheumatoid arthritis involving multiple sites with positive rheumatoid factor   Current Dose per office note 09/01/2022: methotrexate 2.5 mg 8 tablets every 7 days   Okay to refill Methotrexate?

## 2022-12-25 ENCOUNTER — Other Ambulatory Visit (HOSPITAL_COMMUNITY): Payer: Self-pay

## 2022-12-29 ENCOUNTER — Other Ambulatory Visit (HOSPITAL_COMMUNITY): Payer: Self-pay

## 2023-01-11 ENCOUNTER — Other Ambulatory Visit: Payer: Self-pay

## 2023-01-11 ENCOUNTER — Other Ambulatory Visit (HOSPITAL_BASED_OUTPATIENT_CLINIC_OR_DEPARTMENT_OTHER): Payer: Self-pay

## 2023-01-11 MED ORDER — FOLIC ACID 1 MG PO TABS
2.0000 mg | ORAL_TABLET | Freq: Every day | ORAL | 3 refills | Status: DC
  Filled 2023-01-11: qty 180, 90d supply, fill #0
  Filled 2023-04-06: qty 180, 90d supply, fill #1
  Filled 2023-07-05 (×2): qty 180, 90d supply, fill #2
  Filled 2023-09-30: qty 180, 90d supply, fill #3

## 2023-01-11 NOTE — Telephone Encounter (Signed)
Patient contacted the office requesting a refill of Folic Acid be sent to [facility] pharmacy.   Last Fill: 12/19/2021  Next Visit: 02/17/2023  Last Visit: 09/01/2022  Dx: Rheumatoid arthritis involving multiple sites with positive rheumatoid factor   Current Dose per office note on 09/01/2022: folic acid 1 mg 2 tablets daily   Okay to refill Folic Acid?

## 2023-01-21 ENCOUNTER — Other Ambulatory Visit (HOSPITAL_BASED_OUTPATIENT_CLINIC_OR_DEPARTMENT_OTHER): Payer: Self-pay

## 2023-01-21 DIAGNOSIS — F419 Anxiety disorder, unspecified: Secondary | ICD-10-CM | POA: Diagnosis not present

## 2023-01-21 DIAGNOSIS — Z131 Encounter for screening for diabetes mellitus: Secondary | ICD-10-CM | POA: Diagnosis not present

## 2023-01-21 DIAGNOSIS — G4700 Insomnia, unspecified: Secondary | ICD-10-CM | POA: Diagnosis not present

## 2023-01-21 DIAGNOSIS — Z733 Stress, not elsewhere classified: Secondary | ICD-10-CM | POA: Diagnosis not present

## 2023-01-21 DIAGNOSIS — G43709 Chronic migraine without aura, not intractable, without status migrainosus: Secondary | ICD-10-CM | POA: Diagnosis not present

## 2023-01-21 DIAGNOSIS — N951 Menopausal and female climacteric states: Secondary | ICD-10-CM | POA: Diagnosis not present

## 2023-01-21 DIAGNOSIS — Z79899 Other long term (current) drug therapy: Secondary | ICD-10-CM | POA: Diagnosis not present

## 2023-01-21 DIAGNOSIS — E038 Other specified hypothyroidism: Secondary | ICD-10-CM | POA: Diagnosis not present

## 2023-01-21 MED ORDER — VENLAFAXINE HCL ER 75 MG PO CP24
225.0000 mg | ORAL_CAPSULE | Freq: Every day | ORAL | 1 refills | Status: DC
  Filled 2023-01-21: qty 270, 90d supply, fill #0
  Filled 2023-05-04: qty 270, 90d supply, fill #1

## 2023-01-21 MED ORDER — LORAZEPAM 0.5 MG PO TABS
0.5000 mg | ORAL_TABLET | Freq: Two times a day (BID) | ORAL | 3 refills | Status: DC
  Filled 2023-01-21: qty 30, 15d supply, fill #0
  Filled 2023-02-27: qty 30, 15d supply, fill #1
  Filled 2023-04-06: qty 30, 15d supply, fill #2
  Filled 2023-05-04: qty 30, 15d supply, fill #3

## 2023-01-21 MED ORDER — TRAZODONE HCL 50 MG PO TABS
25.0000 mg | ORAL_TABLET | Freq: Every day | ORAL | 1 refills | Status: DC
  Filled 2023-01-21: qty 90, 90d supply, fill #0
  Filled 2023-05-04: qty 90, 90d supply, fill #1

## 2023-01-21 MED ORDER — LEVOTHYROXINE SODIUM 88 MCG PO TABS
88.0000 ug | ORAL_TABLET | Freq: Every day | ORAL | 3 refills | Status: DC
  Filled 2023-01-21 – 2023-02-27 (×2): qty 90, 90d supply, fill #0
  Filled 2023-06-04: qty 90, 90d supply, fill #1

## 2023-01-27 ENCOUNTER — Other Ambulatory Visit: Payer: Self-pay | Admitting: Physician Assistant

## 2023-01-27 ENCOUNTER — Other Ambulatory Visit (HOSPITAL_COMMUNITY): Payer: Self-pay

## 2023-01-27 ENCOUNTER — Other Ambulatory Visit: Payer: Self-pay

## 2023-01-27 MED ORDER — HUMIRA (2 PEN) 40 MG/0.8ML ~~LOC~~ AJKT
40.0000 mg | AUTO-INJECTOR | SUBCUTANEOUS | 0 refills | Status: DC
  Filled 2023-01-27: qty 2, 28d supply, fill #0

## 2023-01-27 NOTE — Telephone Encounter (Signed)
Last Fill: 11/05/2022  Labs: 10/05/2022 CBC and CMP are stable.   TB Gold: 10/05/2022   Next Visit: 02/17/2023  Last Visit: 09/01/2022  [id number] arthritis involving multiple sites with positive rheumatoid factor   Current Dose per office note 09/01/2022: Humira 40 mg sq injections every 14 days   Patient advised she is due to update her lab work. Patient states she will come in next week for labs.   Okay to refill Humira?

## 2023-01-29 ENCOUNTER — Other Ambulatory Visit (HOSPITAL_COMMUNITY): Payer: Self-pay

## 2023-02-08 NOTE — Progress Notes (Signed)
Office Visit Note  Patient: Courtney Leonard             Date of Birth: [birth year]           MRN: [id number]             PCP: Eather Colas, FNP Referring: Eather Colas, FNP Visit Date: 02/17/2023 Occupation: @GUAROCC @  Subjective:  Medication management  History of Present Illness: SHALISA MCQUADE is a 57 y.o. female with seropositive rheumatoid arthritis and osteoarthritis.  She is on Humira 40 mg subcu every other week and methotrexate 8 tablets p.o. weekly with folic acid 2 mg p.o. daily.  She states for the last couple of weeks she was having some joint stiffness but discomfort has resolved now.  She is not having discomfort now.  She denies any history of joint swelling.  She continues to have some wrist stiffness in her feet and occasional nocturnal pain when she walks for a long time.  She has been taking Humira and methotrexate on a regular basis without any interruption.    Activities of Daily Living:  Patient reports morning stiffness for 30-60 minutes.   Patient Reports nocturnal pain.  Difficulty dressing/grooming: Denies Difficulty climbing stairs: Denies Difficulty getting out of chair: Denies Difficulty using hands for taps, buttons, cutlery, and/or writing: Denies  Review of Systems  Constitutional:  Negative for fatigue.  HENT:  Negative for mouth sores and mouth dryness.   Eyes:  Negative for dryness.  Respiratory:  Negative for shortness of breath.   Cardiovascular:  Negative for chest pain and palpitations.  Gastrointestinal:  Negative for blood in stool, constipation and diarrhea.  Endocrine: Negative for increased urination.  Genitourinary:  Negative for involuntary urination.  Musculoskeletal:  Positive for joint pain, joint pain and morning stiffness. Negative for gait problem, joint swelling, myalgias, muscle weakness, muscle tenderness and myalgias.  Skin:  Negative for color change, rash, hair loss and sensitivity to sunlight.   Allergic/Immunologic: Negative for susceptible to infections.  Neurological:  Positive for headaches. Negative for dizziness and numbness.  Hematological:  Negative for swollen glands.  Psychiatric/Behavioral:  Negative for depressed mood and sleep disturbance. The patient is nervous/anxious.     PMFS History:  Patient Active Problem List   Diagnosis Date Noted   Rheumatoid arthritis involving multiple sites with positive rheumatoid factor (HCC) 06/13/2016   High risk medication use 06/13/2016   Primary osteoarthritis of both feet 06/13/2016   Insomnia 06/13/2016   Acquired hypothyroidism 06/13/2016   Migraine without status migrainosus, not intractable 06/13/2016   Former smoker 06/13/2016   Anxiety 01/08/2011    Past Medical History:  Diagnosis Date   Anal fissure    Anxiety    Arthritis    Depression    Hypothyroidism    Insomnia    Migraine    Osteoarthritis    Rheumatoid arthritis (HCC)    Thyroid disease     Family History  Problem Relation Age of Onset   Diabetes Mother    Hypertension Mother    Atrial fibrillation Father    Prostate cancer Father    Past Surgical History:  Procedure Laterality Date   ABDOMINAL HYSTERECTOMY     CESAREAN SECTION     CYSTOSCOPY W/ URETERAL STENT PLACEMENT Left 03/07/2021   Procedure: CYSTOSCOPY WITH STENT REPLACEMENT, RETROGRADE;  Surgeon: Malen Gauze, MD;  Location: [facility];  Service: Urology;  Laterality: Left;   CYSTOSCOPY WITH URETEROSCOPY AND STENT PLACEMENT Left 03/19/2021  Procedure: CYSTOSCOPY WITH LEFT RETROGRADE PYELOGRAM URETEROSCOPY AND STONE EXTRACTION AND STENT EXCHANGE;  Surgeon: Crista Elliot, MD;  Location: [facility];  Service: Urology;  Laterality: Left;   HEMORRHOID SURGERY     KNEE SURGERY     RT knee   laproscopy     LIPOSUCTION  01/15/2021   & excess skin removal   MOUTH SURGERY     ROBOTIC ASSISTED SALPINGO OOPHERECTOMY Left 09/12/2014   Procedure: ROBOTIC ASSISTED Left SALPINGO  OOPHORECTOMY/Right Salpingectomy/Pelvic Washings;  Surgeon: Genia Del, MD;  Location: [facility];  Service: Gynecology;  Laterality: Left;   UTERINE STENT PLACEMENT  03/07/2021   Social History   Social History Narrative   Not on file   Immunization History  Administered Date(s) Administered   Hepatitis B 11/08/2009, 12/11/2009   Influenza,trivalent, recombinat, inj, PF 05/17/2006, 05/03/2013   Influenza-Unspecified 05/21/2017, 05/01/2018   PFIZER Comirnaty(Gray Top)Covid-19 Tri-Sucrose Vaccine 08/24/2019, 09/18/2019, 08/30/2020   PFIZER(Purple Top)SARS-COV-2 Vaccination 08/04/2019, 08/25/2019   Pneumococcal Polysaccharide-23 04/10/2016   Td 06/26/2003   Tdap 03/06/2010, 01/13/2016   Varicella 12/25/2009, 01/24/2010   Zoster Recombinant(Shingrix) 09/24/2018, 03/17/2019     Objective: Vital Signs: BP 119/80 (BP Location: Left Arm, Patient Position: Sitting, Cuff Size: Normal)   Pulse 80   Resp 16   Ht 5\' 4"  (1.626 m)   Wt 164 lb 9.6 oz (74.7 kg)   BMI 28.25 kg/m    Physical Exam Vitals and nursing note reviewed.  Constitutional:      Appearance: She is well-developed.  HENT:     Head: Normocephalic and atraumatic.  Eyes:     Conjunctiva/sclera: Conjunctivae normal.  Cardiovascular:     Rate and Rhythm: Normal rate and regular rhythm.     Heart sounds: Normal heart sounds.  Pulmonary:     Effort: Pulmonary effort is normal.     Breath sounds: Normal breath sounds.  Abdominal:     General: Bowel sounds are normal.     Palpations: Abdomen is soft.  Musculoskeletal:     Cervical back: Normal range of motion.  Lymphadenopathy:     Cervical: No cervical adenopathy.  Skin:    General: Skin is warm and dry.     Capillary Refill: Capillary refill takes less than 2 seconds.  Neurological:     Mental Status: She is alert and oriented to person, place, and time.  Psychiatric:        Behavior: Behavior normal.      Musculoskeletal Exam: Cervical, thoracic and  lumbar spine were in good range of motion.  Shoulder joints, elbow joints, wrist joints, MCPs PIPs and DIPs were in good range of motion with no synovitis.  Hip joints and knee joints were in good range of motion without any warmth swelling or effusion.  There was no tenderness over ankles or MTPs.  CDAI Exam: CDAI Score: -- Patient Global: --; Provider Global: -- Swollen: --; Tender: -- Joint Exam 02/17/2023   No joint exam has been documented for this visit   There is currently no information documented on the homunculus. Go to the Rheumatology activity and complete the homunculus joint exam.  Investigation: No additional findings.  Imaging: No results found.  Recent Labs: Lab Results  Component Value Date   WBC 6.3 02/16/2023   HGB 12.6 02/16/2023   PLT 239 02/16/2023   NA 139 02/16/2023   K 3.9 02/16/2023   CL 105 02/16/2023   CO2 28 02/16/2023   GLUCOSE 101 (H) 02/16/2023   BUN 16  02/16/2023   CREATININE 0.99 02/16/2023   BILITOT 0.3 02/16/2023   ALKPHOS 85 03/05/2021   AST 15 02/16/2023   ALT 13 02/16/2023   PROT 6.8 02/16/2023   ALBUMIN 4.9 03/05/2021   CALCIUM 8.9 02/16/2023   GFRAA 79 12/12/2020   QFTBGOLDPLUS NEGATIVE 10/05/2022    Speciality Comments: No specialty comments available.  Procedures:  No procedures performed Allergies: Clindamycin/lincomycin, Elemental sulfur, and Oxycodone   Assessment / Plan:     Visit Diagnoses: Rheumatoid arthritis involving multiple sites with positive rheumatoid factor (HCC) - +RF, anti-CCP negative: Patient reports some stiffness in her joints for the last couple of weeks which eventually resolved.  She has not noticed any joint swelling.  No synovitis was noted on the examination today.  She continues to be on the combination of Humira and methotrexate without any interruption.  High risk medication use - Humira 40 mg sq injections every 14 days, methotrexate 2.5 mg 8 tablets every 7 days, and folic acid 1 mg 2  tablets daily.  Labs obtained on February 16, 2023 CBC and CMP were normal.  TB Gold was negative on October 05, 2022.  She was advised to get labs every 3 months.  Information on immunization was placed in the AVS.  She was advised to hold Humira and methotrexate if she develops an infection resume after the infection resolves.  Use of sunscreen and sun protection was advised.  Annual skin examination to screen for skin cancer was advised.  Primary osteoarthritis of both feet-she has bilateral PIP and DIP thickening.  Proper fitting shoes were advised.  Other medical problems are listed as follows:  History of hypothyroidism  History of migraine  Primary insomnia  History of anxiety  Former smoker  Orders: No orders of the defined types were placed in this encounter.  No orders of the defined types were placed in this encounter.   Follow-Up Instructions: Return for Rheumatoid arthritis, Osteoarthritis.   Pollyann Savoy, MD  Note - This record has been created using Animal nutritionist.  Chart creation errors have been sought, but may not always  have been located. Such creation errors do not reflect on  the standard of medical care.

## 2023-02-15 ENCOUNTER — Other Ambulatory Visit (HOSPITAL_BASED_OUTPATIENT_CLINIC_OR_DEPARTMENT_OTHER): Payer: Self-pay

## 2023-02-16 ENCOUNTER — Other Ambulatory Visit: Payer: Self-pay | Admitting: *Deleted

## 2023-02-16 ENCOUNTER — Other Ambulatory Visit (HOSPITAL_BASED_OUTPATIENT_CLINIC_OR_DEPARTMENT_OTHER): Payer: Self-pay

## 2023-02-16 DIAGNOSIS — Z79899 Other long term (current) drug therapy: Secondary | ICD-10-CM | POA: Diagnosis not present

## 2023-02-16 LAB — CBC WITH DIFFERENTIAL/PLATELET
HCT: 37 % (ref 35.0–45.0)
MCH: 34.1 pg — ABNORMAL HIGH (ref 27.0–33.0)
RBC: 3.7 10*6/uL — ABNORMAL LOW (ref 3.80–5.10)
RDW: 12.3 % (ref 11.0–15.0)

## 2023-02-16 MED ORDER — SUMATRIPTAN SUCCINATE 50 MG PO TABS
50.0000 mg | ORAL_TABLET | Freq: Once | ORAL | 0 refills | Status: DC | PRN
  Filled 2023-02-16: qty 9, 30d supply, fill #0

## 2023-02-17 ENCOUNTER — Ambulatory Visit: Payer: Commercial Managed Care - PPO | Attending: Rheumatology | Admitting: Rheumatology

## 2023-02-17 ENCOUNTER — Encounter: Payer: Self-pay | Admitting: Rheumatology

## 2023-02-17 VITALS — BP 119/80 | HR 80 | Resp 16 | Ht 64.0 in | Wt 164.6 lb

## 2023-02-17 DIAGNOSIS — M19071 Primary osteoarthritis, right ankle and foot: Secondary | ICD-10-CM | POA: Diagnosis not present

## 2023-02-17 DIAGNOSIS — F5101 Primary insomnia: Secondary | ICD-10-CM | POA: Diagnosis not present

## 2023-02-17 DIAGNOSIS — Z8639 Personal history of other endocrine, nutritional and metabolic disease: Secondary | ICD-10-CM | POA: Diagnosis not present

## 2023-02-17 DIAGNOSIS — Z87891 Personal history of nicotine dependence: Secondary | ICD-10-CM | POA: Diagnosis not present

## 2023-02-17 DIAGNOSIS — M19072 Primary osteoarthritis, left ankle and foot: Secondary | ICD-10-CM | POA: Diagnosis not present

## 2023-02-17 DIAGNOSIS — Z8669 Personal history of other diseases of the nervous system and sense organs: Secondary | ICD-10-CM

## 2023-02-17 DIAGNOSIS — Z79899 Other long term (current) drug therapy: Secondary | ICD-10-CM | POA: Diagnosis not present

## 2023-02-17 DIAGNOSIS — Z8659 Personal history of other mental and behavioral disorders: Secondary | ICD-10-CM | POA: Diagnosis not present

## 2023-02-17 DIAGNOSIS — M0579 Rheumatoid arthritis with rheumatoid factor of multiple sites without organ or systems involvement: Secondary | ICD-10-CM

## 2023-02-17 LAB — COMPLETE METABOLIC PANEL WITH GFR
AG Ratio: 1.6 (calc) (ref 1.0–2.5)
ALT: 13 U/L (ref 6–29)
AST: 15 U/L (ref 10–35)
Albumin: 4.2 g/dL (ref 3.6–5.1)
Alkaline phosphatase (APISO): 65 U/L (ref 37–153)
BUN: 16 mg/dL (ref 7–25)
CO2: 28 mmol/L (ref 20–32)
Calcium: 8.9 mg/dL (ref 8.6–10.4)
Chloride: 105 mmol/L (ref 98–110)
Creat: 0.99 mg/dL (ref 0.50–1.03)
Globulin: 2.6 g/dL (calc) (ref 1.9–3.7)
Glucose, Bld: 101 mg/dL — ABNORMAL HIGH (ref 65–99)
Potassium: 3.9 mmol/L (ref 3.5–5.3)
Sodium: 139 mmol/L (ref 135–146)
Total Bilirubin: 0.3 mg/dL (ref 0.2–1.2)
Total Protein: 6.8 g/dL (ref 6.1–8.1)
eGFR: 67 mL/min/{1.73_m2} (ref >=60)

## 2023-02-17 LAB — CBC WITH DIFFERENTIAL/PLATELET
Absolute Monocytes: 391 cells/uL (ref 200–950)
Basophils Absolute: 38 cells/uL (ref 0–200)
Basophils Relative: 0.6 %
Eosinophils Absolute: 50 cells/uL (ref 15–500)
Eosinophils Relative: 0.8 %
Hemoglobin: 12.6 g/dL (ref 11.7–15.5)
Lymphs Abs: 1846 cells/uL (ref 850–3900)
MCHC: 34.1 g/dL (ref 32.0–36.0)
MCV: 100 fL (ref 80.0–100.0)
MPV: 9.9 fL (ref 7.5–12.5)
Monocytes Relative: 6.2 %
Neutro Abs: 3975 cells/uL (ref 1500–7800)
Neutrophils Relative %: 63.1 %
Platelets: 239 10*3/uL (ref 140–400)
Total Lymphocyte: 29.3 %
WBC: 6.3 10*3/uL (ref 3.8–10.8)

## 2023-02-17 NOTE — Progress Notes (Signed)
CBC and CMP is stable.

## 2023-02-17 NOTE — Patient Instructions (Signed)
Standing Labs We placed an order today for your standing lab work.   Please have your standing labs drawn in October and every 3 months  Please have your labs drawn 2 weeks prior to your appointment so that the provider can discuss your lab results at your appointment, if possible.  Please note that you may see your imaging and lab results in MyChart before we have reviewed them. We will contact you once all results are reviewed. Please allow our office up to 72 hours to thoroughly review all of the results before contacting the office for clarification of your results.  WALK-IN LAB HOURS  Monday through Thursday from 8:00 am -12:30 pm and 1:00 pm-5:00 pm and Friday from 8:00 am-12:00 pm.  Patients with office visits requiring labs will be seen before walk-in labs.  You may encounter longer than normal wait times. Please allow additional time. Wait times may be shorter on  Monday and Thursday afternoons.  We do not book appointments for walk-in labs. We appreciate your patience and understanding with our staff.   Labs are drawn by [facility]. Please bring your co-pay at the time of your lab draw.  You may receive a bill from [facility] for your lab work.  Please note if you are on Hydroxychloroquine and and an order has been placed for a Hydroxychloroquine level,  you will need to have it drawn 4 hours or more after your last dose.  If you wish to have your labs drawn at another location, please call the office 24 hours in advance so we can fax the orders.  The office is located at [address], [place], [place] [address]   If you have any questions regarding directions or hours of operation,  please call [phone].   As a reminder, please drink plenty of water prior to coming for your lab work. Thanks!   Vaccines You are taking a medication(s) that can suppress your immune system.  The following immunizations are recommended: Flu annually Covid-19  Td/Tdap (tetanus,  diphtheria, pertussis) every 10 years Pneumonia (Prevnar 15 then Pneumovax 23 at least 1 year apart.  Alternatively, can take Prevnar 20 without needing additional dose) Shingrix: 2 doses from 4 weeks to 6 months apart  Please check with your PCP to make sure you are up to date.   If you have signs or symptoms of an infection or start antibiotics: First, call your PCP for workup of your infection. Hold your medication through the infection, until you complete your antibiotics, and until symptoms resolve if you take the following: Injectable medication (Actemra, Benlysta, Cimzia, Cosentyx, Enbrel, Humira, Kevzara, Orencia, Remicade, Simponi, Stelara, Taltz, Tremfya) Methotrexate Leflunomide (Arava) Mycophenolate (Cellcept) Harriette Ohara, Olumiant, or Rinvoq  Please get an annual skin examination to screen for skin cancer while you are on Humira.  Please use sunscreen and sun protection.

## 2023-02-23 ENCOUNTER — Other Ambulatory Visit: Payer: Self-pay | Admitting: Physician Assistant

## 2023-02-23 ENCOUNTER — Other Ambulatory Visit: Payer: Self-pay

## 2023-02-23 ENCOUNTER — Other Ambulatory Visit (HOSPITAL_COMMUNITY): Payer: Self-pay

## 2023-02-23 MED ORDER — HUMIRA (2 PEN) 40 MG/0.8ML ~~LOC~~ AJKT
40.0000 mg | AUTO-INJECTOR | SUBCUTANEOUS | 0 refills | Status: DC
  Filled 2023-02-23: qty 2, 28d supply, fill #0
  Filled 2023-03-24: qty 2, 28d supply, fill #1
  Filled 2023-04-21: qty 2, 28d supply, fill #2

## 2023-02-23 NOTE — Telephone Encounter (Signed)
Last Fill: 01/27/2023 (30 day supply)  Labs: 02/16/2023 CBC and CMP is stable.   TB Gold: 10/05/2022 Neg    Next Visit: 07/20/2023  Last Visit: 02/12/2023  DX: Rheumatoid arthritis involving multiple sites with positive rheumatoid factor   Current Dose per office note 02/17/2023: Humira 40 mg sq injections every 14 days   Okay to refill Humira?

## 2023-02-25 ENCOUNTER — Other Ambulatory Visit (HOSPITAL_COMMUNITY): Payer: Self-pay

## 2023-02-27 ENCOUNTER — Other Ambulatory Visit: Payer: Self-pay | Admitting: Physician Assistant

## 2023-02-27 ENCOUNTER — Other Ambulatory Visit (HOSPITAL_BASED_OUTPATIENT_CLINIC_OR_DEPARTMENT_OTHER): Payer: Self-pay

## 2023-03-01 ENCOUNTER — Other Ambulatory Visit: Payer: Self-pay

## 2023-03-01 ENCOUNTER — Other Ambulatory Visit (HOSPITAL_BASED_OUTPATIENT_CLINIC_OR_DEPARTMENT_OTHER): Payer: Self-pay

## 2023-03-01 MED ORDER — METHOTREXATE SODIUM 2.5 MG PO TABS
20.0000 mg | ORAL_TABLET | ORAL | 0 refills | Status: DC
  Filled 2023-03-01: qty 96, 84d supply, fill #0

## 2023-03-01 NOTE — Telephone Encounter (Signed)
Last Fill: 12/04/2022  Labs: 02/16/2023 CBC and CMP is stable.   Next Visit: 07/20/2023  Last Visit: 02/17/2023  DX: Rheumatoid arthritis involving multiple sites with positive rheumatoid factor   Current Dose per office note 02/17/2023: methotrexate 2.5 mg 8 tablets every 7 days   Okay to refill Methotrexate?

## 2023-03-24 ENCOUNTER — Other Ambulatory Visit (HOSPITAL_COMMUNITY): Payer: Self-pay

## 2023-03-31 ENCOUNTER — Other Ambulatory Visit (HOSPITAL_COMMUNITY): Payer: Self-pay

## 2023-04-06 ENCOUNTER — Other Ambulatory Visit (HOSPITAL_BASED_OUTPATIENT_CLINIC_OR_DEPARTMENT_OTHER): Payer: Self-pay

## 2023-04-09 ENCOUNTER — Other Ambulatory Visit (HOSPITAL_BASED_OUTPATIENT_CLINIC_OR_DEPARTMENT_OTHER): Payer: Self-pay

## 2023-04-09 MED ORDER — ESTRADIOL 1 MG PO TABS
1.0000 mg | ORAL_TABLET | Freq: Every day | ORAL | 0 refills | Status: DC
  Filled 2023-04-09: qty 90, 90d supply, fill #0

## 2023-04-21 ENCOUNTER — Other Ambulatory Visit: Payer: Self-pay

## 2023-05-04 ENCOUNTER — Other Ambulatory Visit (HOSPITAL_BASED_OUTPATIENT_CLINIC_OR_DEPARTMENT_OTHER): Payer: Self-pay

## 2023-05-12 ENCOUNTER — Other Ambulatory Visit (HOSPITAL_BASED_OUTPATIENT_CLINIC_OR_DEPARTMENT_OTHER): Payer: Self-pay

## 2023-05-12 DIAGNOSIS — Z79890 Hormone replacement therapy (postmenopausal): Secondary | ICD-10-CM | POA: Diagnosis not present

## 2023-05-12 DIAGNOSIS — G43709 Chronic migraine without aura, not intractable, without status migrainosus: Secondary | ICD-10-CM | POA: Diagnosis not present

## 2023-05-12 DIAGNOSIS — R232 Flushing: Secondary | ICD-10-CM | POA: Diagnosis not present

## 2023-05-12 DIAGNOSIS — Z01419 Encounter for gynecological examination (general) (routine) without abnormal findings: Secondary | ICD-10-CM | POA: Diagnosis not present

## 2023-05-12 MED ORDER — ESTRADIOL 2 MG PO TABS
2.0000 mg | ORAL_TABLET | Freq: Every day | ORAL | 3 refills | Status: AC
  Filled 2023-05-12: qty 90, 90d supply, fill #0
  Filled 2023-08-09: qty 90, 90d supply, fill #1
  Filled 2023-11-09: qty 90, 90d supply, fill #2
  Filled 2024-03-02: qty 90, 90d supply, fill #3

## 2023-05-12 MED ORDER — QULIPTA 60 MG PO TABS
60.0000 mg | ORAL_TABLET | Freq: Every day | ORAL | 11 refills | Status: AC
Start: 2023-05-12 — End: ?
  Filled 2023-05-12 – 2023-07-05 (×2): qty 30, 30d supply, fill #0
  Filled 2023-08-27 (×2): qty 30, 30d supply, fill #1
  Filled 2023-09-30: qty 30, 30d supply, fill #2
  Filled 2023-11-09: qty 30, 30d supply, fill #3

## 2023-05-17 ENCOUNTER — Other Ambulatory Visit: Payer: Self-pay | Admitting: Rheumatology

## 2023-05-17 ENCOUNTER — Other Ambulatory Visit (HOSPITAL_COMMUNITY): Payer: Self-pay

## 2023-05-17 ENCOUNTER — Other Ambulatory Visit: Payer: Self-pay

## 2023-05-17 ENCOUNTER — Other Ambulatory Visit (HOSPITAL_COMMUNITY): Payer: Self-pay | Admitting: Pharmacy Technician

## 2023-05-17 MED ORDER — HUMIRA (2 PEN) 40 MG/0.8ML ~~LOC~~ AJKT
40.0000 mg | AUTO-INJECTOR | SUBCUTANEOUS | 0 refills | Status: DC
  Filled 2023-05-17: qty 2, 28d supply, fill #0

## 2023-05-17 NOTE — Progress Notes (Signed)
Specialty Pharmacy Refill Coordination Note  ZAYANA SALVADOR is a 57 y.o. female contacted today regarding refills of specialty medication(s) Adalimumab   Patient requested Delivery   Delivery date: 06/02/23   Verified address: [address] [place]   Medication will be filled on 06/01/23.  Refill Request sent to MD. Call if any delays.   Patient believe she has 1 inject for this weekend she will call if any shipping dates need to change.

## 2023-05-17 NOTE — Telephone Encounter (Signed)
Last Fill: 02/23/2023  Labs: 02/16/2023 CBC and CMP is stable.   TB Gold: 10/05/2022 Neg    Next Visit: 07/20/2023  Last Visit: 02/17/2023  DX: Rheumatoid arthritis involving multiple sites with positive rheumatoid factor   Current Dose per office note 02/17/2023: Humira 40 mg sq injections every 14 days   Patient advised she is due to update labs. Patient states she will come by in the next couple of days.   Okay to refill Humira?

## 2023-05-31 ENCOUNTER — Other Ambulatory Visit: Payer: Self-pay

## 2023-05-31 DIAGNOSIS — Z79899 Other long term (current) drug therapy: Secondary | ICD-10-CM | POA: Diagnosis not present

## 2023-06-01 ENCOUNTER — Other Ambulatory Visit: Payer: Self-pay

## 2023-06-01 ENCOUNTER — Other Ambulatory Visit: Payer: Self-pay | Admitting: Rheumatology

## 2023-06-01 ENCOUNTER — Other Ambulatory Visit (HOSPITAL_BASED_OUTPATIENT_CLINIC_OR_DEPARTMENT_OTHER): Payer: Self-pay

## 2023-06-01 LAB — CBC WITH DIFFERENTIAL/PLATELET
Absolute Lymphocytes: 1939 {cells}/uL (ref 850–3900)
Absolute Monocytes: 432 {cells}/uL (ref 200–950)
Basophils Absolute: 38 {cells}/uL (ref 0–200)
Basophils Relative: 0.7 %
Eosinophils Absolute: 70 {cells}/uL (ref 15–500)
Eosinophils Relative: 1.3 %
HCT: 37.7 % (ref 35.0–45.0)
Hemoglobin: 12.6 g/dL (ref 11.7–15.5)
MCH: 33.2 pg — ABNORMAL HIGH (ref 27.0–33.0)
MCHC: 33.4 g/dL (ref 32.0–36.0)
MCV: 99.5 fL (ref 80.0–100.0)
MPV: 10.1 fL (ref 7.5–12.5)
Monocytes Relative: 8 %
Neutro Abs: 2921 {cells}/uL (ref 1500–7800)
Neutrophils Relative %: 54.1 %
Platelets: 252 10*3/uL (ref 140–400)
RBC: 3.79 10*6/uL — ABNORMAL LOW (ref 3.80–5.10)
RDW: 12.4 % (ref 11.0–15.0)
Total Lymphocyte: 35.9 %
WBC: 5.4 10*3/uL (ref 3.8–10.8)

## 2023-06-01 LAB — COMPLETE METABOLIC PANEL WITH GFR
AG Ratio: 1.6 (calc) (ref 1.0–2.5)
ALT: 13 U/L (ref 6–29)
AST: 16 U/L (ref 10–35)
Albumin: 4.1 g/dL (ref 3.6–5.1)
Alkaline phosphatase (APISO): 72 U/L (ref 37–153)
BUN: 15 mg/dL (ref 7–25)
CO2: 30 mmol/L (ref 20–32)
Calcium: 8.5 mg/dL — ABNORMAL LOW (ref 8.6–10.4)
Chloride: 105 mmol/L (ref 98–110)
Creat: 0.92 mg/dL (ref 0.50–1.03)
Globulin: 2.5 g/dL (ref 1.9–3.7)
Glucose, Bld: 106 mg/dL — ABNORMAL HIGH (ref 65–99)
Potassium: 4.3 mmol/L (ref 3.5–5.3)
Sodium: 140 mmol/L (ref 135–146)
Total Bilirubin: 0.3 mg/dL (ref 0.2–1.2)
Total Protein: 6.6 g/dL (ref 6.1–8.1)
eGFR: 73 mL/min/{1.73_m2} (ref >=60)

## 2023-06-01 MED ORDER — METHOTREXATE SODIUM 2.5 MG PO TABS
20.0000 mg | ORAL_TABLET | ORAL | 0 refills | Status: DC
  Filled 2023-06-01: qty 96, 84d supply, fill #0

## 2023-06-01 NOTE — Telephone Encounter (Signed)
Last Fill: 03/01/2023  Labs: 05/31/2023 CBC is normal.  CMP showed low calcium.  Patient should take calcium  1200 mg a day between dietary and supplement   Next Visit: 07/20/2023  Last Visit: 02/17/2023  DX: Rheumatoid arthritis involving multiple sites with positive rheumatoid factor   Current Dose per office note on 02/17/2023: methotrexate 2.5 mg 8 tablets every 7 days   Okay to refill Methotrexate?

## 2023-06-01 NOTE — Progress Notes (Signed)
CBC is normal.  CMP showed low calcium.  Patient should take calcium  1200 mg a day between dietary and supplement

## 2023-06-01 NOTE — Telephone Encounter (Signed)
Patient contacted the office to request a medication refill.   1. Name of Medication: Methotrexate  2. How are you currently taking this medication (dosage and times per day)? 8 tablets/once a week   3. What pharmacy would you like for that to be sent to? [facility]

## 2023-06-04 ENCOUNTER — Other Ambulatory Visit (HOSPITAL_BASED_OUTPATIENT_CLINIC_OR_DEPARTMENT_OTHER): Payer: Self-pay

## 2023-06-04 ENCOUNTER — Other Ambulatory Visit: Payer: Self-pay

## 2023-06-04 MED ORDER — SUMATRIPTAN SUCCINATE 50 MG PO TABS
ORAL_TABLET | ORAL | 0 refills | Status: DC
  Filled 2023-06-04: qty 9, 15d supply, fill #0

## 2023-06-04 MED ORDER — LORAZEPAM 0.5 MG PO TABS
0.5000 mg | ORAL_TABLET | Freq: Two times a day (BID) | ORAL | 3 refills | Status: DC | PRN
  Filled 2023-06-04: qty 30, 15d supply, fill #0
  Filled 2023-07-05 (×2): qty 30, 15d supply, fill #1
  Filled 2023-08-09: qty 30, 15d supply, fill #2
  Filled 2023-09-09: qty 30, 15d supply, fill #3

## 2023-06-22 ENCOUNTER — Other Ambulatory Visit: Payer: Self-pay

## 2023-06-22 ENCOUNTER — Other Ambulatory Visit (HOSPITAL_COMMUNITY): Payer: Self-pay

## 2023-06-22 ENCOUNTER — Other Ambulatory Visit: Payer: Self-pay | Admitting: Physician Assistant

## 2023-06-22 MED ORDER — HUMIRA (2 PEN) 40 MG/0.8ML ~~LOC~~ AJKT
40.0000 mg | AUTO-INJECTOR | SUBCUTANEOUS | 2 refills | Status: DC
  Filled 2023-06-22: qty 2, 28d supply, fill #0
  Filled 2023-07-21: qty 2, 28d supply, fill #1
  Filled 2023-08-20: qty 2, 28d supply, fill #2

## 2023-06-22 NOTE — Telephone Encounter (Signed)
Last Fill: 05/17/2023 (30 day supply)  Labs: 05/31/2023 CBC is normal.  CMP showed low calcium.   TB Gold: 10/05/2022 Neg    Next Visit: 07/20/2023  Last Visit: 02/17/2023  [id number] arthritis involving multiple sites with positive rheumatoid factor   Current Dose per office note 02/17/2023: Humira 40 mg sq injections every 14 days   Okay to refill Humira?

## 2023-06-22 NOTE — Progress Notes (Signed)
Specialty Pharmacy Refill Coordination Note  Courtney Leonard is a 57 y.o. female contacted today regarding refills of specialty medication(s) Adalimumab   Patient requested Delivery   Delivery date: 06/25/23   Verified address: [address] [facility] [address]   Medication will be filled on 06/24/23. *Pending refill request*

## 2023-06-24 ENCOUNTER — Other Ambulatory Visit (HOSPITAL_COMMUNITY): Payer: Self-pay

## 2023-06-24 ENCOUNTER — Telehealth: Payer: Self-pay

## 2023-06-24 NOTE — Telephone Encounter (Signed)
Received notification from [facility] regarding a prior authorization for HUMIRA. Authorization has been APPROVED from 06/24/23 to 06/23/24. Approval letter sent to scan center.  Patient must continue to fill through [facility]: [phone]   Authorization # [id number]  [facility] team notified (sent Teams message to [place], CPhT)  Chesley Mires, PharmD, MPH, BCPS, CPP Clinical Pharmacist (Rheumatology and Pulmonology)

## 2023-06-24 NOTE — Progress Notes (Signed)
Spoke with patient & aware of PA. Please call with new Shipping info. Message has been sent to [facility].

## 2023-06-24 NOTE — Telephone Encounter (Signed)
Received notification from [facility] that PA requires renewal.  Submitted an URGENT Prior Authorization request to [facility] for HUMIRA via CoverMyMeds. Will update once we receive a response.  Key: [id number]

## 2023-07-05 ENCOUNTER — Other Ambulatory Visit (HOSPITAL_BASED_OUTPATIENT_CLINIC_OR_DEPARTMENT_OTHER): Payer: Self-pay

## 2023-07-06 ENCOUNTER — Other Ambulatory Visit (HOSPITAL_BASED_OUTPATIENT_CLINIC_OR_DEPARTMENT_OTHER): Payer: Self-pay

## 2023-07-06 NOTE — Progress Notes (Signed)
Office Visit Note  Patient: ROSSELYN Leonard             Date of Birth: March 28, 1966           MRN: [id number]             PCP: Eather Colas, FNP Referring: Eather Colas, FNP Visit Date: 07/20/2023 Occupation: @GUAROCC @  Subjective:  Medication monitoring   History of Present Illness: Courtney Leonard is a 57 y.o. female with history of seropositive rheumatoid arthritis and osteoarthritis.  Patient remains on Humira 40 mg sq injections every 14 days, methotrexate 2.5 mg 8 tablets every 7 days, and folic acid 1 mg 2 tablets daily.  She is tolerating combination therapy without any side effects and has not had any injection site reactions from Humira.  Patient reports that she recently had a cold and held both Humira and methotrexate until her symptoms resolved and has since resumed therapy.  She denies any recurrent infections.  She denies any signs or symptoms of a rheumatoid arthritis flare.  She is not experiencing any joint swelling at this time.  She has not had any difficulty with ADLs.  She denies any new medical conditions.   Activities of Daily Living:  Patient reports morning stiffness for 45-60 minutes.   Patient Reports nocturnal pain.  Difficulty dressing/grooming: Denies Difficulty climbing stairs: Denies Difficulty getting out of chair: Denies Difficulty using hands for taps, buttons, cutlery, and/or writing: Denies  Review of Systems  Constitutional:  Positive for fatigue.  HENT:  Negative for mouth sores, mouth dryness and nose dryness.   Eyes:  Negative for pain and dryness.  Respiratory:  Negative for cough, shortness of breath and wheezing.   Cardiovascular:  Negative for chest pain and palpitations.  Gastrointestinal:  Negative for blood in stool, constipation and diarrhea.  Endocrine: Negative for increased urination.  Genitourinary:  Negative for involuntary urination.  Musculoskeletal:  Positive for joint pain, joint pain and morning stiffness. Negative  for gait problem, joint swelling, myalgias, muscle weakness, muscle tenderness and myalgias.  Skin:  Negative for color change, rash, hair loss and sensitivity to sunlight.  Allergic/Immunologic: Negative for susceptible to infections.  Neurological:  Negative for dizziness and headaches.  Hematological:  Negative for swollen glands.  Psychiatric/Behavioral:  Positive for depressed mood and sleep disturbance. The patient is nervous/anxious.     PMFS History:  Patient Active Problem List   Diagnosis Date Noted   Rheumatoid arthritis involving multiple sites with positive rheumatoid factor (HCC) 06/13/2016   High risk medication use 06/13/2016   Primary osteoarthritis of both feet 06/13/2016   Insomnia 06/13/2016   Acquired hypothyroidism 06/13/2016   Migraine without status migrainosus, not intractable 06/13/2016   Former smoker 06/13/2016   Anxiety 01/08/2011    Past Medical History:  Diagnosis Date   Anal fissure    Anxiety    Arthritis    Depression    Hypothyroidism    Insomnia    Migraine    Osteoarthritis    Rheumatoid arthritis (HCC)    Thyroid disease     Family History  Problem Relation Age of Onset   Diabetes Mother    Hypertension Mother    Atrial fibrillation Father    Prostate cancer Father    Past Surgical History:  Procedure Laterality Date   ABDOMINAL HYSTERECTOMY     CESAREAN SECTION     CYSTOSCOPY W/ URETERAL STENT PLACEMENT Left 03/07/2021   Procedure: CYSTOSCOPY WITH STENT REPLACEMENT, RETROGRADE;  Surgeon: Malen Gauze, MD;  Location: [facility];  Service: Urology;  Laterality: Left;   CYSTOSCOPY WITH URETEROSCOPY AND STENT PLACEMENT Left 03/19/2021   Procedure: CYSTOSCOPY WITH LEFT RETROGRADE PYELOGRAM URETEROSCOPY AND STONE EXTRACTION AND STENT EXCHANGE;  Surgeon: Crista Elliot, MD;  Location: [facility];  Service: Urology;  Laterality: Left;   HEMORRHOID SURGERY     KNEE SURGERY     RT knee   laproscopy     LIPOSUCTION  01/15/2021   &  excess skin removal   MOUTH SURGERY     ROBOTIC ASSISTED SALPINGO OOPHERECTOMY Left 09/12/2014   Procedure: ROBOTIC ASSISTED Left SALPINGO OOPHORECTOMY/Right Salpingectomy/Pelvic Washings;  Surgeon: Genia Del, MD;  Location: [facility];  Service: Gynecology;  Laterality: Left;   UTERINE STENT PLACEMENT  03/07/2021   Social History   Social History Narrative   Not on file   Immunization History  Administered Date(s) Administered   Hepatitis B 11/08/2009, 12/11/2009   Influenza,trivalent, recombinat, inj, PF 05/17/2006, 05/03/2013   Influenza-Unspecified 05/21/2017, 05/01/2018   PFIZER Comirnaty(Gray Top)Covid-19 Tri-Sucrose Vaccine 08/24/2019, 09/18/2019, 08/30/2020   PFIZER(Purple Top)SARS-COV-2 Vaccination 08/04/2019, 08/25/2019   Pneumococcal Polysaccharide-23 04/10/2016   Td 06/26/2003   Tdap 03/06/2010, 01/13/2016   Varicella 12/25/2009, 01/24/2010   Zoster Recombinant(Shingrix) 09/24/2018, 03/17/2019     Objective: Vital Signs: BP 114/75 (BP Location: Left Arm, Patient Position: Sitting, Cuff Size: Normal)   Pulse 83   Resp 14   Ht 5\' 4"  (1.626 m)   Wt 171 lb 12.8 oz (77.9 kg)   BMI 29.49 kg/m    Physical Exam Vitals and nursing note reviewed.  Constitutional:      Appearance: She is well-developed.  HENT:     Head: Normocephalic and atraumatic.  Eyes:     Conjunctiva/sclera: Conjunctivae normal.  Cardiovascular:     Rate and Rhythm: Normal rate and regular rhythm.     Heart sounds: Normal heart sounds.  Pulmonary:     Effort: Pulmonary effort is normal.     Breath sounds: Normal breath sounds.  Abdominal:     General: Bowel sounds are normal.     Palpations: Abdomen is soft.  Musculoskeletal:     Cervical back: Normal range of motion.  Lymphadenopathy:     Cervical: No cervical adenopathy.  Skin:    General: Skin is warm and dry.     Capillary Refill: Capillary refill takes less than 2 seconds.  Neurological:     Mental Status: She is alert and  oriented to person, place, and time.  Psychiatric:        Behavior: Behavior normal.      Musculoskeletal Exam: C-spine, thoracic spine, lumbar spine good range of motion.  Shoulder joints, elbow joints, wrist joints, MCPs, PIPs, DIPs have good range of motion with no synovitis.  Complete fist formation bilaterally.  Hip joints have good range of motion with no groin pain.  Knee joints have good range of motion no warmth or effusion.  Ankle joints have good range of motion with no tenderness or joint swelling.  CDAI Exam: CDAI Score: -- Patient Global: 20 / 100; Provider Global: 20 / 100 Swollen: --; Tender: -- Joint Exam 07/20/2023   No joint exam has been documented for this visit   There is currently no information documented on the homunculus. Go to the Rheumatology activity and complete the homunculus joint exam.  Investigation: No additional findings.  Imaging: No results found.  Recent Labs: Lab Results  Component Value Date  WBC 5.4 05/31/2023   HGB 12.6 05/31/2023   PLT 252 05/31/2023   NA 140 05/31/2023   K 4.3 05/31/2023   CL 105 05/31/2023   CO2 30 05/31/2023   GLUCOSE 106 (H) 05/31/2023   BUN 15 05/31/2023   CREATININE 0.92 05/31/2023   BILITOT 0.3 05/31/2023   ALKPHOS 85 03/05/2021   AST 16 05/31/2023   ALT 13 05/31/2023   PROT 6.6 05/31/2023   ALBUMIN 4.9 03/05/2021   CALCIUM 8.5 (L) 05/31/2023   GFRAA 79 12/12/2020   QFTBGOLDPLUS NEGATIVE 10/05/2022    Speciality Comments: No specialty comments available.  Procedures:  No procedures performed Allergies: Clindamycin/lincomycin, Elemental sulfur, and Oxycodone   Assessment / Plan:     Visit Diagnoses: Rheumatoid arthritis involving multiple sites with positive rheumatoid factor (HCC) - +RF, anti-CCP negative: She has no joint tenderness or synovitis on examination today.  She has not had any signs or symptoms of a rheumatoid arthritis flare.  She is clinically been doing well on Humira 40 mg  subcutaneous injections every 14 days and methotrexate 8 tablets by mouth once weekly.  She is tolerating combination therapy without any side effects.  Her morning stiffness typically last 45 minutes to an hour.  She has not had any difficulty with ADLs.  She has not noticed any joint swelling.  No medication changes will be made at this time.  She was advised to notify us if she develops signs or symptoms of a flare.  She will follow-up in the office in 5 months or sooner if needed.  High risk medication use - Humira 40 mg sq injections every 14 days, methotrexate 2.5 mg 8 tablets every 7 days, and folic acid 1 mg 2 tablets daily.  CBC and CMP updated on 05/31/23.  Her next lab work will be due in January and every 3 months.  Standing orders for CBC and CMP were placed today. TB gold negative on 10/05/22. No recurrent infections. Discussed the importance of holding humira and methotrexate if she develops signs or symptoms of an infection and to resume once the infection has completely cleared. - Plan: CBC with Differential/Platelet, COMPLETE METABOLIC PANEL WITH GFR  Primary osteoarthritis of both feet: She experiences intermittent discomfort in the right great toe which she attributes to weather changes.  She is good range of motion of both ankle joints with no tenderness or synovitis.  She is wearing proper fitting shoes.  Other medical conditions are listed as follows:  History of hypothyroidism  History of migraine  Primary insomnia  History of anxiety  Former smoker  Orders: Orders Placed This Encounter  Procedures   CBC with Differential/Platelet   COMPLETE METABOLIC PANEL WITH GFR   No orders of the defined types were placed in this encounter.   Follow-Up Instructions: Return in about 5 months (around 12/18/2023) for Rheumatoid arthritis.   Gearldine Bienenstock, PA-C  Note - This record has been created using Dragon software.  Chart creation errors have been sought, but may not  always  have been located. Such creation errors do not reflect on  the standard of medical care.

## 2023-07-07 ENCOUNTER — Other Ambulatory Visit (HOSPITAL_BASED_OUTPATIENT_CLINIC_OR_DEPARTMENT_OTHER): Payer: Self-pay

## 2023-07-08 ENCOUNTER — Other Ambulatory Visit (HOSPITAL_BASED_OUTPATIENT_CLINIC_OR_DEPARTMENT_OTHER): Payer: Self-pay

## 2023-07-12 ENCOUNTER — Other Ambulatory Visit (HOSPITAL_BASED_OUTPATIENT_CLINIC_OR_DEPARTMENT_OTHER): Payer: Self-pay

## 2023-07-13 ENCOUNTER — Other Ambulatory Visit (HOSPITAL_BASED_OUTPATIENT_CLINIC_OR_DEPARTMENT_OTHER): Payer: Self-pay

## 2023-07-15 ENCOUNTER — Other Ambulatory Visit (HOSPITAL_BASED_OUTPATIENT_CLINIC_OR_DEPARTMENT_OTHER): Payer: Self-pay

## 2023-07-19 ENCOUNTER — Other Ambulatory Visit (HOSPITAL_BASED_OUTPATIENT_CLINIC_OR_DEPARTMENT_OTHER): Payer: Self-pay

## 2023-07-20 ENCOUNTER — Other Ambulatory Visit (HOSPITAL_BASED_OUTPATIENT_CLINIC_OR_DEPARTMENT_OTHER): Payer: Self-pay

## 2023-07-20 ENCOUNTER — Encounter: Payer: Self-pay | Admitting: Physician Assistant

## 2023-07-20 ENCOUNTER — Ambulatory Visit: Payer: Commercial Managed Care - PPO | Attending: Physician Assistant | Admitting: Physician Assistant

## 2023-07-20 VITALS — BP 114/75 | HR 83 | Resp 14 | Ht 64.0 in | Wt 171.8 lb

## 2023-07-20 DIAGNOSIS — M0579 Rheumatoid arthritis with rheumatoid factor of multiple sites without organ or systems involvement: Secondary | ICD-10-CM | POA: Diagnosis not present

## 2023-07-20 DIAGNOSIS — Z8669 Personal history of other diseases of the nervous system and sense organs: Secondary | ICD-10-CM | POA: Diagnosis not present

## 2023-07-20 DIAGNOSIS — F5101 Primary insomnia: Secondary | ICD-10-CM | POA: Diagnosis not present

## 2023-07-20 DIAGNOSIS — Z79899 Other long term (current) drug therapy: Secondary | ICD-10-CM

## 2023-07-20 DIAGNOSIS — M19071 Primary osteoarthritis, right ankle and foot: Secondary | ICD-10-CM | POA: Diagnosis not present

## 2023-07-20 DIAGNOSIS — Z8639 Personal history of other endocrine, nutritional and metabolic disease: Secondary | ICD-10-CM | POA: Diagnosis not present

## 2023-07-20 DIAGNOSIS — Z8659 Personal history of other mental and behavioral disorders: Secondary | ICD-10-CM | POA: Diagnosis not present

## 2023-07-20 DIAGNOSIS — M19072 Primary osteoarthritis, left ankle and foot: Secondary | ICD-10-CM

## 2023-07-20 DIAGNOSIS — Z87891 Personal history of nicotine dependence: Secondary | ICD-10-CM | POA: Diagnosis not present

## 2023-07-20 NOTE — Patient Instructions (Signed)

## 2023-07-21 ENCOUNTER — Other Ambulatory Visit: Payer: Self-pay

## 2023-07-21 NOTE — Progress Notes (Signed)
Specialty Pharmacy Refill Coordination Note  Courtney Leonard is a 57 y.o. female contacted today regarding refills of specialty medication(s) Adalimumab (Humira (2 Pen))   Patient requested Delivery   Delivery date: 07/30/23   Verified address: [address] [facility] [address]   Medication will be filled on 07/29/23.

## 2023-07-26 ENCOUNTER — Other Ambulatory Visit (HOSPITAL_BASED_OUTPATIENT_CLINIC_OR_DEPARTMENT_OTHER): Payer: Self-pay

## 2023-07-26 DIAGNOSIS — Z0000 Encounter for general adult medical examination without abnormal findings: Secondary | ICD-10-CM | POA: Diagnosis not present

## 2023-07-26 DIAGNOSIS — F331 Major depressive disorder, recurrent, moderate: Secondary | ICD-10-CM | POA: Diagnosis not present

## 2023-07-26 DIAGNOSIS — F5109 Other insomnia not due to a substance or known physiological condition: Secondary | ICD-10-CM | POA: Diagnosis not present

## 2023-07-26 DIAGNOSIS — N951 Menopausal and female climacteric states: Secondary | ICD-10-CM | POA: Diagnosis not present

## 2023-07-26 DIAGNOSIS — F419 Anxiety disorder, unspecified: Secondary | ICD-10-CM | POA: Diagnosis not present

## 2023-07-26 DIAGNOSIS — E038 Other specified hypothyroidism: Secondary | ICD-10-CM | POA: Diagnosis not present

## 2023-07-26 DIAGNOSIS — Z13220 Encounter for screening for lipoid disorders: Secondary | ICD-10-CM | POA: Diagnosis not present

## 2023-07-26 DIAGNOSIS — Z90710 Acquired absence of both cervix and uterus: Secondary | ICD-10-CM | POA: Diagnosis not present

## 2023-07-26 MED ORDER — LEVOTHYROXINE SODIUM 88 MCG PO TABS
88.0000 ug | ORAL_TABLET | Freq: Every day | ORAL | 3 refills | Status: DC
  Filled 2023-07-26 – 2023-08-27 (×2): qty 90, 90d supply, fill #0
  Filled 2023-09-30 – 2023-11-09 (×2): qty 90, 90d supply, fill #1
  Filled 2024-03-02: qty 90, 90d supply, fill #2
  Filled 2024-06-01 (×2): qty 90, 90d supply, fill #3

## 2023-07-26 MED ORDER — VENLAFAXINE HCL ER 75 MG PO CP24
225.0000 mg | ORAL_CAPSULE | Freq: Every day | ORAL | 1 refills | Status: DC
  Filled 2023-07-26: qty 270, 90d supply, fill #0
  Filled 2023-11-09: qty 270, 90d supply, fill #1

## 2023-07-26 MED ORDER — TRAZODONE HCL 50 MG PO TABS
50.0000 mg | ORAL_TABLET | Freq: Every day | ORAL | 1 refills | Status: DC
  Filled 2023-07-26: qty 90, 90d supply, fill #0
  Filled 2023-11-30: qty 90, 90d supply, fill #1

## 2023-08-09 ENCOUNTER — Other Ambulatory Visit (HOSPITAL_BASED_OUTPATIENT_CLINIC_OR_DEPARTMENT_OTHER): Payer: Self-pay

## 2023-08-18 ENCOUNTER — Other Ambulatory Visit: Payer: Self-pay

## 2023-08-20 ENCOUNTER — Other Ambulatory Visit: Payer: Self-pay

## 2023-08-20 NOTE — Progress Notes (Signed)
Specialty Pharmacy Refill Coordination Note  Courtney Leonard is a 58 y.o. female contacted today regarding refills of specialty medication(s) Adalimumab (Humira (2 Pen))   Patient requested (Patient-Rptd) Delivery   Delivery date: (Patient-Rptd) 08/31/23   Verified address: (Patient-Rptd) [address] [place], [place] [address]   Medication will be filled on 01.27.25.

## 2023-08-27 ENCOUNTER — Other Ambulatory Visit: Payer: Self-pay | Admitting: Physician Assistant

## 2023-08-27 ENCOUNTER — Other Ambulatory Visit (HOSPITAL_BASED_OUTPATIENT_CLINIC_OR_DEPARTMENT_OTHER): Payer: Self-pay

## 2023-08-27 NOTE — Telephone Encounter (Signed)
Last Fill: 06/01/2023  Labs: 07/26/2023 MCV 100.6, MCH 34.5  Next Visit: 01/19/2024  Last Visit: 07/20/2023  DX: Rheumatoid arthritis involving multiple sites with positive rheumatoid factor   Current Dose per office note 07/20/2023: methotrexate 2.5 mg 8 tablets every 7 days   Okay to refill Methotrexate?

## 2023-08-30 ENCOUNTER — Other Ambulatory Visit: Payer: Self-pay

## 2023-08-30 ENCOUNTER — Other Ambulatory Visit (HOSPITAL_BASED_OUTPATIENT_CLINIC_OR_DEPARTMENT_OTHER): Payer: Self-pay

## 2023-08-30 MED ORDER — METHOTREXATE SODIUM 2.5 MG PO TABS
20.0000 mg | ORAL_TABLET | ORAL | 0 refills | Status: DC
  Filled 2023-08-30: qty 96, 84d supply, fill #0

## 2023-09-09 ENCOUNTER — Other Ambulatory Visit (HOSPITAL_BASED_OUTPATIENT_CLINIC_OR_DEPARTMENT_OTHER): Payer: Self-pay

## 2023-09-21 ENCOUNTER — Other Ambulatory Visit: Payer: Self-pay

## 2023-09-21 ENCOUNTER — Other Ambulatory Visit: Payer: Self-pay | Admitting: Physician Assistant

## 2023-09-21 ENCOUNTER — Other Ambulatory Visit (HOSPITAL_COMMUNITY): Payer: Self-pay

## 2023-09-21 MED ORDER — HUMIRA (2 PEN) 40 MG/0.8ML ~~LOC~~ AJKT
40.0000 mg | AUTO-INJECTOR | SUBCUTANEOUS | 2 refills | Status: DC
  Filled 2023-09-21: qty 2, 28d supply, fill #0
  Filled 2023-10-19: qty 2, 28d supply, fill #1
  Filled 2023-11-30: qty 2, 28d supply, fill #2

## 2023-09-21 NOTE — Progress Notes (Signed)
Specialty Pharmacy Ongoing Clinical Assessment Note  Courtney Leonard is a 57 y.o. female who is being followed by the specialty pharmacy service for RxSp Rheumatoid Arthritis   Patient's specialty medication(s) reviewed today: Adalimumab (Humira (2 Pen))   Missed doses in the last 4 weeks: 0   Patient/Caregiver did not have any additional questions or concerns.   Therapeutic benefit summary: Patient is achieving benefit   Adverse events/side effects summary: No adverse events/side effects   Patient's therapy is appropriate to: Continue    Goals Addressed             This Visit's Progress    Minimize recurrence of flares       Patient is on track. Patient will maintain adherence         Follow up:  6 months  Otto Herb Specialty Pharmacist

## 2023-09-21 NOTE — Telephone Encounter (Signed)
Last Fill: 06/22/2023  Labs: 07/26/2023 MCV 100.6, MCH 34.5  TB Gold: 10/05/2022 Neg    Next Visit: 01/19/2024  Last Visit: 07/20/2023  DX: Rheumatoid arthritis involving multiple sites with positive rheumatoid factor   Current Dose per office note 07/20/2023: Humira 40 mg sq injections every 14 days   Okay to refill Humira?

## 2023-09-21 NOTE — Progress Notes (Signed)
Specialty Pharmacy Refill Coordination Note  Courtney Leonard is a 58 y.o. female contacted today regarding refills of specialty medication(s) Adalimumab (Humira (2 Pen))   Patient requested Delivery   Delivery date: 09/28/23   Verified address: [address] [place], [place] [address]   Medication will be filled on 09/27/23.

## 2023-09-28 DIAGNOSIS — D7589 Other specified diseases of blood and blood-forming organs: Secondary | ICD-10-CM | POA: Diagnosis not present

## 2023-09-30 ENCOUNTER — Other Ambulatory Visit: Payer: Self-pay

## 2023-09-30 ENCOUNTER — Other Ambulatory Visit (HOSPITAL_BASED_OUTPATIENT_CLINIC_OR_DEPARTMENT_OTHER): Payer: Self-pay

## 2023-09-30 ENCOUNTER — Ambulatory Visit: Payer: Commercial Managed Care - PPO

## 2023-09-30 ENCOUNTER — Ambulatory Visit
Admission: EM | Admit: 2023-09-30 | Discharge: 2023-09-30 | Disposition: A | Discharge status: Home / Self Care | Payer: Commercial Managed Care - PPO | Attending: Internal Medicine | Admitting: Internal Medicine

## 2023-09-30 DIAGNOSIS — M542 Cervicalgia: Secondary | ICD-10-CM

## 2023-09-30 DIAGNOSIS — M25512 Pain in left shoulder: Secondary | ICD-10-CM

## 2023-09-30 DIAGNOSIS — M47812 Spondylosis without myelopathy or radiculopathy, cervical region: Secondary | ICD-10-CM | POA: Diagnosis not present

## 2023-09-30 DIAGNOSIS — M549 Dorsalgia, unspecified: Secondary | ICD-10-CM

## 2023-09-30 DIAGNOSIS — M25519 Pain in unspecified shoulder: Secondary | ICD-10-CM | POA: Diagnosis not present

## 2023-09-30 MED ORDER — BACLOFEN 5 MG PO TABS: 5.0000 mg | ORAL_TABLET | Freq: Three times a day (TID) | ORAL | 0 refills | Status: DC | PRN

## 2023-09-30 MED ORDER — PREDNISONE 20 MG PO TABS: ORAL_TABLET | ORAL | 0 refills | Status: DC

## 2023-09-30 MED ORDER — METHYLPREDNISOLONE ACETATE 80 MG/ML IJ SUSP
40.0000 mg | Freq: Once | INTRAMUSCULAR | Status: AC
  Administered 2023-09-30: 40 mg via INTRAMUSCULAR

## 2023-09-30 NOTE — ED Triage Notes (Signed)
 C/O pain left shoulder and scapula area that radiates down arm. Patient states pain seems to be coming from the scapula area. Denies injury. Symptoms for one month. Patient is using biofreeze, ibuprofen and tylenol with no relief.

## 2023-09-30 NOTE — ED Provider Notes (Signed)
 [facility]  Note:  This document was prepared using Dragon voice recognition software and may include unintentional dictation errors.  MRN: [id number] DOB: Jan 03, 1966 DATE: 09/30/23   Subjective:  Chief Complaint:  Chief Complaint  Patient presents with   Shoulder Pain     HPI: Courtney Leonard is a 58 y.o. female presenting for left shoulder pain for the past 3 weeks. Patient reports NKI. Patient does work in an acute rehab setting. Patient reports pain is in posterior shoulder with radiation into her the middle of her back. Pain worse with abduction, extension, and rotation in her neck. She has been taking ibuprofen, tylenol, and Biofreeze with no relief. Denies chest pain or shortness of breath. She reports some radiation into her left arm occasionally, but none currently. She states she has a history of RA as well. Denies fever, nausea/vomiting, abdominal pain, chest pain, SOB. Endorses left shoulder pain, upper back pain. Presents NAD.  Prior to Admission medications   Medication Sig Start Date End Date Taking? Authorizing Provider  Adalimumab (HUMIRA PEN) 40 MG/0.8ML PNKT INJECT 40 MG INTO THE SKIN EVERY 14 (FOURTEEN) DAYS. Patient not taking: Reported on 07/20/2023 12/26/21 12/26/22  Quentin Angst, MD  adalimumab (HUMIRA, 2 PEN,) 40 MG/0.8ML AJKT pen INJECT 40 MG INTO THE SKIN EVERY 14 (FOURTEEN) DAYS. 09/21/23 09/20/24  Pollyann Savoy, MD  Atogepant (QULIPTA) 60 MG TABS Take 1 tablet (60 mg total) by mouth daily. Patient not taking: Reported on 07/20/2023 05/12/23     bisacodyl (DULCOLAX) 5 MG EC tablet Take 5 mg by mouth at bedtime.    [provider]  diclofenac sodium (VOLTAREN) 1 % GEL Apply 2 g topically daily as needed (pain). 04/26/18   [provider]  estradiol (ESTRACE) 1 MG tablet Take 1 tablet (1 mg total) by mouth daily. Patient not taking: Reported on 07/20/2023 03/03/22     estradiol (ESTRACE) 2 MG tablet Take 1 tablet (2 mg total) by  mouth daily. 05/12/23     folic acid (FOLVITE) 1 MG tablet Take 2 tablets (2 mg total) by mouth daily. 01/11/23   Gearldine Bienenstock, PA-C  ibuprofen (ADVIL) 200 MG tablet Take 600 mg by mouth every 6 (six) hours as needed for mild pain.    [provider]  levothyroxine (SYNTHROID) 88 MCG tablet Take 1 tablet (88 mcg total) by mouth daily. 01/21/23     levothyroxine (SYNTHROID) 88 MCG tablet Take 1 tablet (88 mcg total) by mouth daily. 07/26/23     LORazepam (ATIVAN) 0.5 MG tablet Take 1 tablet (0.5 mg total) by mouth 2 (two) times a day as needed for anxiety. 06/04/23     methotrexate (RHEUMATREX) 2.5 MG tablet Take 8 tablets (20 mg total) by mouth once a week. Caution: Chemotherapy. Protect from light. 08/30/23   Gearldine Bienenstock, PA-C  SUMAtriptan (IMITREX) 50 MG tablet Take one tablet by mouth once as needed for migraine for up to 1 dose.  May repeat dose once in 2 hours if no relief.  Do not exceed 2 doses in 24 hours 06/04/23     traZODone (DESYREL) 50 MG tablet Take 1 tablet (50 mg total) by mouth at bedtime. 07/26/23     TURMERIC PO Take 2 tablets by mouth daily. Patient not taking: Reported on 07/20/2023    [provider]  venlafaxine XR (EFFEXOR-XR) 75 MG 24 hr capsule Take 3 capsules (225 mg total) by mouth daily. 07/26/23        Allergies  Allergen Reactions   Clindamycin/Lincomycin     headaches   Elemental Sulfur     Interferes with RA meds    Oxycodone     Night terrors     History:   Past Medical History:  Diagnosis Date   Anal fissure    Anxiety    Arthritis    Depression    Hypothyroidism    Insomnia    Migraine    Osteoarthritis    Rheumatoid arthritis (HCC)    Thyroid disease      Past Surgical History:  Procedure Laterality Date   ABDOMINAL HYSTERECTOMY     CESAREAN SECTION     CYSTOSCOPY W/ URETERAL STENT PLACEMENT Left 03/07/2021   Procedure: CYSTOSCOPY WITH STENT REPLACEMENT, RETROGRADE;  Surgeon: Malen Gauze, MD;  Location: [facility];   Service: Urology;  Laterality: Left;   CYSTOSCOPY WITH URETEROSCOPY AND STENT PLACEMENT Left 03/19/2021   Procedure: CYSTOSCOPY WITH LEFT RETROGRADE PYELOGRAM URETEROSCOPY AND STONE EXTRACTION AND STENT EXCHANGE;  Surgeon: Crista Elliot, MD;  Location: [facility];  Service: Urology;  Laterality: Left;   HEMORRHOID SURGERY     KNEE SURGERY     RT knee   laproscopy     LIPOSUCTION  01/15/2021   & excess skin removal   MOUTH SURGERY     ROBOTIC ASSISTED SALPINGO OOPHERECTOMY Left 09/12/2014   Procedure: ROBOTIC ASSISTED Left SALPINGO OOPHORECTOMY/Right Salpingectomy/Pelvic Washings;  Surgeon: Genia Del, MD;  Location: [facility];  Service: Gynecology;  Laterality: Left;   UTERINE STENT PLACEMENT  03/07/2021    Family History  Problem Relation Age of Onset   Diabetes Mother    Hypertension Mother    Atrial fibrillation Father    Prostate cancer Father     Social History   Tobacco Use   Smoking status: Former    Current packs/day: 0.00    Average packs/day: 0.1 packs/day for 5.0 years (0.5 ttl pk-yrs)    Types: Cigarettes    Start date: 06/28/1996    Quit date: 06/28/2001    Years since quitting: 22.2    Passive exposure: Past   Smokeless tobacco: Never  Vaping Use   Vaping status: Never Used  Substance Use Topics   Alcohol use: Yes    Comment: occ   Drug use: No    Review of Systems  Constitutional:  Negative for fever.  Respiratory:  Negative for chest tightness and shortness of breath.   Cardiovascular:  Negative for chest pain.  Gastrointestinal:  Negative for abdominal pain, nausea and vomiting.  Musculoskeletal:  Positive for arthralgias, back pain, myalgias and neck pain.  Skin:  Negative for rash.  Neurological:  Negative for numbness.     Objective:   Vitals: BP 126/81 (BP Location: Right Arm)   Pulse 70   Temp 98.1 F (36.7 C) (Oral)   Resp 18   SpO2 98%   Physical Exam Constitutional:      General: She is not in acute distress.     Appearance: Normal appearance. She is well-developed and normal weight. She is not ill-appearing or toxic-appearing.  HENT:     Head: Normocephalic and atraumatic.  Cardiovascular:     Rate and Rhythm: Normal rate and regular rhythm.     Heart sounds: Normal heart sounds.  Pulmonary:     Effort: Pulmonary effort is normal.     Breath sounds: Normal breath sounds.     Comments: Clear to auscultation bilaterally  Abdominal:     General: Bowel  sounds are normal.     Palpations: Abdomen is soft.     Tenderness: There is no abdominal tenderness. There is no right CVA tenderness or left CVA tenderness.  Musculoskeletal:     Right shoulder: Normal.     Left shoulder: Tenderness present. Decreased range of motion. Normal pulse.     Left upper arm: Normal.       Arms:     Thoracic back: Tenderness present. Decreased range of motion.     Lumbar back: No tenderness. Normal range of motion.     Comments: TTP of left posterior shoulder medial to left scapula with pain along left thoracic spin. Decreased ROM due to pain with abduction and extension. NV intact. No warmth, erythema, or discharge.   Skin:    General: Skin is warm and dry.     Findings: No rash.  Neurological:     General: No focal deficit present.     Mental Status: She is alert.  Psychiatric:        Mood and Affect: Mood and affect normal.     Results:  Labs: No results found for this or any previous visit (from the past 24 hours).  Radiology: No results found.   UC Course/Treatments:  Procedures: Procedures   Medications Ordered in UC: Medications  methylPREDNISolone acetate (DEPO-MEDROL) injection 40 mg (has no administration in time range)     Assessment and Plan :     ICD-10-CM   1. Acute pain of left shoulder  M25.512     2. Upper back pain on left side  M54.9      Acute pain of left shoulder Afebrile, nontoxic-appearing, NAD. VSS. DDX includes but not limited to: fracture, contusion, dislocation,  strain, MI, aortic dissection, stroke Unable to perform imaging due to lack of RT today in office. Order was provided for left shoulder outpatient. Suspect strain given TTP with what appears to be muscle spasms. Methylprednisolone 40 mg IM was given today in office. She was also given a Prednisone taper as directed and Baclofen 5mg  TID PRN was prescribed for muscle spasms. She was instructed to avoid taking her ativan and trazodone with the baclofen. Follow up with orthopedics if no improvement. Strict ED precautions were given and patient verbalized understanding.  Upper Back Pain on the Left Side Afebrile, nontoxic-appearing, NAD. VSS. DDX includes but not limited to: fracture, contusion, dislocation, strain, MI, aortic dissection, stroke Unable to perform imaging due to lack of RT today in office. Order was provided for cervical spine. Suspect strain given TTP with what appears to be muscle spasms. Methylprednisolone 40 mg IM was given today in office. She was also given a Prednisone taper as directed and Baclofen 5mg  TID PRN was prescribed for muscle spasms. She was instructed to avoid taking her ativan and trazodone with the baclofen. Follow up with orthopedics if no improvement. Strict ED precautions were given and patient verbalized understanding.  ED Discharge Orders          Ordered    predniSONE (DELTASONE) 20 MG tablet        09/30/23 1404    Baclofen 5 MG TABS  3 times daily PRN        09/30/23 1405    DG Shoulder Left        09/30/23 1405    DG Cervical Spine 2 or 3 views        09/30/23 1405             PDMP  not reviewed this encounter.     Cynda Acres, PA-C 09/30/23 1451

## 2023-09-30 NOTE — Discharge Instructions (Addendum)
 You were given an order for an x-ray. Take this to [facility] for imaging. You can walk right in with your x-ray order and do not have to be seen. I have attached the information to your paperwork.  You were also given a prescription for a muscle relaxer (Baclofen) take at bedtime when you are not driving or working because it may cause drowsiness. You should AVOID taking it with your trazodone and ativan.  You were given an injection (Depo-Medrol) for your pain today.  The first is a steroid often used for pain and inflammation.  You were prescribed Prednisone as well start this tomorrow given your injection today in office.  Recommend rest, ice, compression, and elevation.   Return in 3-4 days if no improvement.

## 2023-10-08 ENCOUNTER — Other Ambulatory Visit (HOSPITAL_BASED_OUTPATIENT_CLINIC_OR_DEPARTMENT_OTHER): Payer: Self-pay

## 2023-10-10 ENCOUNTER — Other Ambulatory Visit: Payer: Self-pay

## 2023-10-10 ENCOUNTER — Ambulatory Visit
Admission: EM | Admit: 2023-10-10 | Discharge: 2023-10-10 | Disposition: A | Discharge status: Home / Self Care | Attending: Emergency Medicine | Admitting: Emergency Medicine

## 2023-10-10 DIAGNOSIS — J09X2 Influenza due to identified novel influenza A virus with other respiratory manifestations: Secondary | ICD-10-CM

## 2023-10-10 LAB — POC COVID19/FLU A&B COMBO
Covid Antigen, POC: NEGATIVE
Influenza A Antigen, POC: POSITIVE — AB
Influenza B Antigen, POC: NEGATIVE

## 2023-10-10 LAB — POCT RAPID STREP A (OFFICE): Rapid Strep A Screen: NEGATIVE

## 2023-10-10 MED ORDER — PROMETHAZINE-DM 6.25-15 MG/5ML PO SYRP
5.0000 mL | ORAL_SOLUTION | Freq: Four times a day (QID) | ORAL | 0 refills | Status: AC | PRN
Start: 2023-10-10 — End: ?

## 2023-10-10 MED ORDER — OSELTAMIVIR PHOSPHATE 75 MG PO CAPS: 75.0000 mg | ORAL_CAPSULE | Freq: Two times a day (BID) | ORAL | 0 refills | Status: DC

## 2023-10-10 NOTE — ED Provider Notes (Signed)
 [facility]    CSN: [id number] Arrival date & time: 10/10/23  1044      History   Chief Complaint Chief Complaint  Patient presents with   Cough    HPI Courtney Leonard is a 58 y.o. female.   Patient presents to clinic with hot and cold chills, cough, congestion, sore throat, bilateral ear pain, body aches, fatigue and a headache that started abruptly yesterday.  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  Has been taking Tylenol and guaifenesin.  Feels a little short of breath.  Has not had any vomiting or diarrhea.  The history is provided by the patient and medical records.  Cough   Past Medical History:  Diagnosis Date   Anal fissure    Anxiety    Arthritis    Depression    Hypothyroidism    Insomnia    Migraine    Osteoarthritis    Rheumatoid arthritis (HCC)    Thyroid disease     Patient Active Problem List   Diagnosis Date Noted   Rheumatoid arthritis involving multiple sites with positive rheumatoid factor (HCC) 06/13/2016   High risk medication use 06/13/2016   Primary osteoarthritis of both feet 06/13/2016   Insomnia 06/13/2016   Acquired hypothyroidism 06/13/2016   Migraine without status migrainosus, not intractable 06/13/2016   Former smoker 06/13/2016   Anxiety 01/08/2011    Past Surgical History:  Procedure Laterality Date   ABDOMINAL HYSTERECTOMY     CESAREAN SECTION     CYSTOSCOPY W/ URETERAL STENT PLACEMENT Left 03/07/2021   Procedure: CYSTOSCOPY WITH STENT REPLACEMENT, RETROGRADE;  Surgeon: Malen Gauze, MD;  Location: [facility];  Service: Urology;  Laterality: Left;   CYSTOSCOPY WITH URETEROSCOPY AND STENT PLACEMENT Left 03/19/2021   Procedure: CYSTOSCOPY WITH LEFT RETROGRADE PYELOGRAM URETEROSCOPY AND STONE EXTRACTION AND STENT EXCHANGE;  Surgeon: Crista Elliot, MD;  Location: [facility];  Service: Urology;  Laterality: Left;   HEMORRHOID SURGERY     KNEE  SURGERY     RT knee   laproscopy     LIPOSUCTION  01/15/2021   & excess skin removal   MOUTH SURGERY     ROBOTIC ASSISTED SALPINGO OOPHERECTOMY Left 09/12/2014   Procedure: ROBOTIC ASSISTED Left SALPINGO OOPHORECTOMY/Right Salpingectomy/Pelvic Washings;  Surgeon: Genia Del, MD;  Location: [facility];  Service: Gynecology;  Laterality: Left;   UTERINE STENT PLACEMENT  03/07/2021    OB History   No obstetric history on file.      Home Medications    Prior to Admission medications   Medication Sig Start Date End Date Taking? Authorizing Provider  oseltamivir (TAMIFLU) 75 MG capsule Take 1 capsule (75 mg total) by mouth every 12 (twelve) hours. 10/10/23  Yes Rinaldo Ratel, [place] N, FNP  promethazine-dextromethorphan (PROMETHAZINE-DM) 6.25-15 MG/5ML syrup Take 5 mLs by mouth 4 (four) times daily as needed for cough. 10/10/23  Yes Rinaldo Ratel, [place] N, FNP  Adalimumab (HUMIRA PEN) 40 MG/0.8ML PNKT INJECT 40 MG INTO THE SKIN EVERY 14 (FOURTEEN) DAYS. Patient not taking: Reported on 07/20/2023 12/26/21 12/26/22  Quentin Angst, MD  adalimumab (HUMIRA, 2 PEN,) 40 MG/0.8ML AJKT pen INJECT 40 MG INTO THE SKIN EVERY 14 (FOURTEEN) DAYS. 09/21/23 09/20/24  Pollyann Savoy, MD  Atogepant (QULIPTA) 60 MG TABS Take 1 tablet (60 mg total) by mouth daily. Patient not taking: Reported on 07/20/2023 05/12/23     Baclofen 5 MG TABS  Take 1 tablet (5 mg total) by mouth 3 (three) times daily as needed. 09/30/23   Hermanns, Ashlee P, PA-C  bisacodyl (DULCOLAX) 5 MG EC tablet Take 5 mg by mouth at bedtime.    [provider]  diclofenac sodium (VOLTAREN) 1 % GEL Apply 2 g topically daily as needed (pain). 04/26/18   [provider]  estradiol (ESTRACE) 1 MG tablet Take 1 tablet (1 mg total) by mouth daily. Patient not taking: Reported on 07/20/2023 03/03/22     estradiol (ESTRACE) 2 MG tablet Take 1 tablet (2 mg total) by mouth daily. 05/12/23     folic acid (FOLVITE) 1 MG tablet Take 2 tablets (2  mg total) by mouth daily. 01/11/23   Gearldine Bienenstock, PA-C  ibuprofen (ADVIL) 200 MG tablet Take 600 mg by mouth every 6 (six) hours as needed for mild pain.    [provider]  levothyroxine (SYNTHROID) 88 MCG tablet Take 1 tablet (88 mcg total) by mouth daily. 01/21/23     levothyroxine (SYNTHROID) 88 MCG tablet Take 1 tablet (88 mcg total) by mouth daily. 07/26/23     LORazepam (ATIVAN) 0.5 MG tablet Take 1 tablet (0.5 mg total) by mouth 2 (two) times a day as needed for anxiety. 06/04/23     methotrexate (RHEUMATREX) 2.5 MG tablet Take 8 tablets (20 mg total) by mouth once a week. Caution: Chemotherapy. Protect from light. 08/30/23   Gearldine Bienenstock, PA-C  SUMAtriptan (IMITREX) 50 MG tablet Take one tablet by mouth once as needed for migraine for up to 1 dose.  May repeat dose once in 2 hours if no relief.  Do not exceed 2 doses in 24 hours 06/04/23     traZODone (DESYREL) 50 MG tablet Take 1 tablet (50 mg total) by mouth at bedtime. 07/26/23     venlafaxine XR (EFFEXOR-XR) 75 MG 24 hr capsule Take 3 capsules (225 mg total) by mouth daily. 07/26/23       Family History Family History  Problem Relation Age of Onset   Diabetes Mother    Hypertension Mother    Atrial fibrillation Father    Prostate cancer Father     Social History Social History   Tobacco Use   Smoking status: Former    Current packs/day: 0.00    Average packs/day: 0.1 packs/day for 5.0 years (0.5 ttl pk-yrs)    Types: Cigarettes    Start date: 06/28/1996    Quit date: 06/28/2001    Years since quitting: 22.2    Passive exposure: Past   Smokeless tobacco: Never  Vaping Use   Vaping status: Never Used  Substance Use Topics   Alcohol use: Yes    Comment: occ   Drug use: No     Allergies   Clindamycin/lincomycin, Elemental sulfur, and Oxycodone   Review of Systems Review of Systems  Per HPI   Physical Exam Triage Vital Signs ED Triage Vitals  Encounter Vitals Group     BP 10/10/23 1054 119/83      Systolic BP Percentile --      Diastolic BP Percentile --      Pulse Rate 10/10/23 1054 84     Resp 10/10/23 1054 18     Temp 10/10/23 1054 99 F (37.2 C)     Temp Source 10/10/23 1054 Oral     SpO2 10/10/23 1054 98 %     Weight --      Height --      Head Circumference --  Peak Flow --      Pain Score 10/10/23 1056 4     Pain Loc --      Pain Education --      Exclude from Growth Chart --    No data found.  Updated Vital Signs BP 119/83 (BP Location: Right Arm)   Pulse 84   Temp 99 F (37.2 C) (Oral)   Resp 18   SpO2 98%   Visual Acuity Right Eye Distance:   Left Eye Distance:   Bilateral Distance:    Right Eye Near:   Left Eye Near:    Bilateral Near:     Physical Exam Vitals and nursing note reviewed.  Constitutional:      Appearance: Normal appearance.  HENT:     Head: Normocephalic and atraumatic.     Right Ear: External ear normal.     Left Ear: External ear normal.     Nose: Congestion and rhinorrhea present.     Mouth/Throat:     Mouth: Mucous membranes are moist.     Pharynx: Posterior oropharyngeal erythema present.  Eyes:     Conjunctiva/sclera: Conjunctivae normal.  Cardiovascular:     Rate and Rhythm: Normal rate and regular rhythm.     Heart sounds: Normal heart sounds. No murmur heard. Pulmonary:     Effort: Pulmonary effort is normal. No respiratory distress.     Breath sounds: Normal breath sounds. No wheezing.  Musculoskeletal:        General: Normal range of motion.  Skin:    General: Skin is warm and dry.  Neurological:     General: No focal deficit present.     Mental Status: She is alert.  Psychiatric:        Mood and Affect: Mood normal.      UC Treatments / Results  Labs (all labs ordered are listed, but only abnormal results are displayed) Labs Reviewed  POC COVID19/FLU A&B COMBO - Abnormal; Notable for the following components:      Result Value   Influenza A Antigen, POC Positive (*)    All other  components within normal limits  POCT RAPID STREP A (OFFICE)    EKG   Radiology No results found.  Procedures Procedures (including critical care time)  Medications Ordered in UC Medications - No data to display  Initial Impression / Assessment and Plan / UC Course  I have reviewed the triage vital signs and the nursing notes.  Pertinent labs & imaging results that were available during my care of the patient were reviewed by me and considered in my medical decision making (see chart for details).  Vitals and triage reviewed, patient is hemodynamically stable.  Lungs are vesicular, heart with regular rate and rhythm.  Congestion, rhinorrhea and postnasal drip present on physical exam.  POC testing positive for influenza A, within the window for Tamiflu and opted to move forward with treatment.  Symptomatic management for viral illness discussed.  Work note provided.  Plan of care, follow-up care return precautions given, no questions at this time.     Final Clinical Impressions(s) / UC Diagnoses   Final diagnoses:  Influenza due to identified novel influenza A virus with other respiratory manifestations     Discharge Instructions      Your testing today in clinic was positive for influenza A.  Start the Tamiflu and take this twice daily until finished.  You can use the cough syrup up to 4 times daily as needed, do not  drink alcohol or drive on this medication as it may cause drowsiness.  Ensure you drink at least 64 ounces of water, 1200 mg of Mucinex can help loosen secretions as well as sleeping with a humidifier.  You can alternate between Tylenol and ibuprofen every 4-6 hours for fever, body aches and chills.  Symptoms should improve over the next 5 days or so.  If no improvement or any changes return to clinic or follow-up with your primary care provider.     ED Prescriptions     Medication Sig Dispense Auth. Provider   promethazine-dextromethorphan (PROMETHAZINE-DM)  6.25-15 MG/5ML syrup Take 5 mLs by mouth 4 (four) times daily as needed for cough. 118 mL Rinaldo Ratel, [place] N, [place]   oseltamivir (TAMIFLU) 75 MG capsule Take 1 capsule (75 mg total) by mouth every 12 (twelve) hours. 10 capsule Retina Bernardy, [place] N, FNP      PDMP not reviewed this encounter.   Mozell Hardacre, [place] N, [place] 10/10/23 [phone]

## 2023-10-10 NOTE — ED Triage Notes (Signed)
 C/O cough and cold symptoms, onset yesterday. Patient is taking OTC tylenol and guiafenisin.

## 2023-10-10 NOTE — Discharge Instructions (Addendum)
 Your testing today in clinic was positive for influenza A.  Start the Tamiflu and take this twice daily until finished.  You can use the cough syrup up to 4 times daily as needed, do not drink alcohol or drive on this medication as it may cause drowsiness.  Ensure you drink at least 64 ounces of water, 1200 mg of Mucinex can help loosen secretions as well as sleeping with a humidifier.  You can alternate between Tylenol and ibuprofen every 4-6 hours for fever, body aches and chills.  Symptoms should improve over the next 5 days or so.  If no improvement or any changes return to clinic or follow-up with your primary care provider.

## 2023-10-15 ENCOUNTER — Other Ambulatory Visit (HOSPITAL_BASED_OUTPATIENT_CLINIC_OR_DEPARTMENT_OTHER): Payer: Self-pay

## 2023-10-15 MED ORDER — LORAZEPAM 0.5 MG PO TABS
0.5000 mg | ORAL_TABLET | Freq: Two times a day (BID) | ORAL | 1 refills | Status: DC | PRN
  Filled 2023-10-15: qty 30, 15d supply, fill #0
  Filled 2023-11-09: qty 30, 15d supply, fill #1

## 2023-10-19 ENCOUNTER — Other Ambulatory Visit: Payer: Self-pay

## 2023-10-19 NOTE — Progress Notes (Signed)
 Specialty Pharmacy Refill Coordination Note  [facility] is a 58 y.o. female contacted today regarding refills of specialty medication(s) Adalimumab (Humira (2 Pen))   Patient requested (Patient-Rptd) Delivery   Delivery date: (Patient-Rptd) 11/02/23   Verified address: (Patient-Rptd) [address] [place] [address]   Medication will be filled on 03.31.25.

## 2023-11-09 ENCOUNTER — Other Ambulatory Visit (HOSPITAL_BASED_OUTPATIENT_CLINIC_OR_DEPARTMENT_OTHER): Payer: Self-pay

## 2023-11-10 ENCOUNTER — Other Ambulatory Visit: Payer: Self-pay

## 2023-11-10 ENCOUNTER — Other Ambulatory Visit (HOSPITAL_BASED_OUTPATIENT_CLINIC_OR_DEPARTMENT_OTHER): Payer: Self-pay

## 2023-11-11 ENCOUNTER — Other Ambulatory Visit: Payer: Self-pay

## 2023-11-16 ENCOUNTER — Other Ambulatory Visit (HOSPITAL_BASED_OUTPATIENT_CLINIC_OR_DEPARTMENT_OTHER): Payer: Self-pay

## 2023-11-16 DIAGNOSIS — G43709 Chronic migraine without aura, not intractable, without status migrainosus: Secondary | ICD-10-CM | POA: Diagnosis not present

## 2023-11-16 MED ORDER — SUMATRIPTAN SUCCINATE 100 MG PO TABS
ORAL_TABLET | ORAL | 11 refills | Status: AC
  Filled 2023-11-16: qty 9, 20d supply, fill #0
  Filled 2023-12-10: qty 9, 20d supply, fill #1
  Filled 2024-03-02: qty 9, 20d supply, fill #2
  Filled 2024-03-17 – 2024-04-07 (×3): qty 9, 20d supply, fill #3
  Filled 2024-04-28: qty 9, 20d supply, fill #4
  Filled 2024-08-07: qty 9, 20d supply, fill #5

## 2023-11-17 ENCOUNTER — Other Ambulatory Visit (HOSPITAL_BASED_OUTPATIENT_CLINIC_OR_DEPARTMENT_OTHER): Payer: Self-pay

## 2023-11-17 MED ORDER — QULIPTA 60 MG PO TABS
1.0000 | ORAL_TABLET | Freq: Every day | ORAL | 11 refills | Status: AC
  Filled 2023-12-08: qty 30, 30d supply, fill #0
  Filled 2024-01-19 – 2024-01-27 (×3): qty 30, 30d supply, fill #1
  Filled 2024-03-17: qty 30, 30d supply, fill #2
  Filled 2024-04-28: qty 30, 30d supply, fill #3
  Filled 2024-06-01 (×2): qty 30, 30d supply, fill #4
  Filled 2024-07-21 (×2): qty 30, 30d supply, fill #5
  Filled 2024-08-23 (×2): qty 30, 30d supply, fill #6

## 2023-11-29 ENCOUNTER — Other Ambulatory Visit: Payer: Self-pay

## 2023-11-30 ENCOUNTER — Other Ambulatory Visit: Payer: Self-pay | Admitting: Pharmacy Technician

## 2023-11-30 ENCOUNTER — Other Ambulatory Visit: Payer: Self-pay

## 2023-11-30 ENCOUNTER — Other Ambulatory Visit (HOSPITAL_BASED_OUTPATIENT_CLINIC_OR_DEPARTMENT_OTHER): Payer: Self-pay

## 2023-11-30 ENCOUNTER — Other Ambulatory Visit: Payer: Self-pay | Admitting: Physician Assistant

## 2023-11-30 DIAGNOSIS — Z79899 Other long term (current) drug therapy: Secondary | ICD-10-CM

## 2023-11-30 DIAGNOSIS — Z9225 Personal history of immunosupression therapy: Secondary | ICD-10-CM

## 2023-11-30 DIAGNOSIS — Z111 Encounter for screening for respiratory tuberculosis: Secondary | ICD-10-CM

## 2023-11-30 MED ORDER — METHOTREXATE SODIUM 2.5 MG PO TABS
20.0000 mg | ORAL_TABLET | ORAL | 0 refills | Status: DC
Start: 2023-11-30 — End: 2023-12-28
  Filled 2023-11-30: qty 32, 28d supply, fill #0

## 2023-11-30 NOTE — Progress Notes (Signed)
 Specialty Pharmacy Refill Coordination Note  SHEREESE KAERCHER is a 58 y.o. female contacted today regarding refills of specialty medication(s) Adalimumab  (Humira  (2 Pen))   Patient requested (Patient-Rptd) Delivery   Delivery date: (Patient-Rptd) 12/07/23   Verified address: (Patient-Rptd) [address]   Medication will be filled on 12/06/23.

## 2023-11-30 NOTE — Telephone Encounter (Signed)
 Last Fill: 08/30/2023  Labs: 07/26/2023 CMP WNL, 09/28/2023 RBC 3.88, MCV 100.1, MCH 33.7  Next Visit: 01/19/2024  Last Visit: 07/20/2023  DX: Rheumatoid arthritis involving multiple sites with positive rheumatoid factor   Current Dose per office note 07/20/2023: methotrexate  2.5 mg 8 tablets every 7 days   Patient advised she is due for labs. Patient plans on coming Thursday to update.   Okay to refill Methotrexate ?

## 2023-12-06 ENCOUNTER — Other Ambulatory Visit: Payer: Self-pay

## 2023-12-08 ENCOUNTER — Other Ambulatory Visit (HOSPITAL_BASED_OUTPATIENT_CLINIC_OR_DEPARTMENT_OTHER): Payer: Self-pay

## 2023-12-09 ENCOUNTER — Other Ambulatory Visit: Payer: Self-pay | Admitting: *Deleted

## 2023-12-09 DIAGNOSIS — Z79899 Other long term (current) drug therapy: Secondary | ICD-10-CM

## 2023-12-09 DIAGNOSIS — Z9225 Personal history of immunosupression therapy: Secondary | ICD-10-CM | POA: Diagnosis not present

## 2023-12-09 DIAGNOSIS — Z111 Encounter for screening for respiratory tuberculosis: Secondary | ICD-10-CM

## 2023-12-10 ENCOUNTER — Other Ambulatory Visit (HOSPITAL_BASED_OUTPATIENT_CLINIC_OR_DEPARTMENT_OTHER): Payer: Self-pay

## 2023-12-10 ENCOUNTER — Other Ambulatory Visit: Payer: Self-pay | Admitting: Rheumatology

## 2023-12-10 ENCOUNTER — Other Ambulatory Visit: Payer: Self-pay

## 2023-12-10 NOTE — Progress Notes (Signed)
 CBC and CMP are normal.

## 2023-12-12 LAB — COMPREHENSIVE METABOLIC PANEL WITH GFR
AG Ratio: 1.7 (calc) (ref 1.0–2.5)
ALT: 13 U/L (ref 6–29)
AST: 15 U/L (ref 10–35)
Albumin: 4.2 g/dL (ref 3.6–5.1)
Alkaline phosphatase (APISO): 63 U/L (ref 37–153)
BUN: 11 mg/dL (ref 7–25)
CO2: 31 mmol/L (ref 20–32)
Calcium: 9.4 mg/dL (ref 8.6–10.4)
Chloride: 103 mmol/L (ref 98–110)
Creat: 0.76 mg/dL (ref 0.50–1.03)
Globulin: 2.5 g/dL (ref 1.9–3.7)
Glucose, Bld: 91 mg/dL (ref 65–99)
Potassium: 4.5 mmol/L (ref 3.5–5.3)
Sodium: 139 mmol/L (ref 135–146)
Total Bilirubin: 0.3 mg/dL (ref 0.2–1.2)
Total Protein: 6.7 g/dL (ref 6.1–8.1)
eGFR: 92 mL/min/{1.73_m2} (ref >=60)

## 2023-12-12 LAB — CBC WITH DIFFERENTIAL/PLATELET
Absolute Lymphocytes: 1686 {cells}/uL (ref 850–3900)
Absolute Monocytes: 431 {cells}/uL (ref 200–950)
Basophils Absolute: 51 {cells}/uL (ref 0–200)
Basophils Relative: 0.7 %
Eosinophils Absolute: 51 {cells}/uL (ref 15–500)
Eosinophils Relative: 0.7 %
HCT: 39.7 % (ref 35.0–45.0)
Hemoglobin: 13.6 g/dL (ref 11.7–15.5)
MCH: 34.2 pg — ABNORMAL HIGH (ref 27.0–33.0)
MCHC: 34.3 g/dL (ref 32.0–36.0)
MCV: 99.7 fL (ref 80.0–100.0)
MPV: 9.8 fL (ref 7.5–12.5)
Monocytes Relative: 5.9 %
Neutro Abs: 5081 {cells}/uL (ref 1500–7800)
Neutrophils Relative %: 69.6 %
Platelets: 262 10*3/uL (ref 140–400)
RBC: 3.98 Million/uL (ref 3.80–5.10)
RDW: 12.4 % (ref 11.0–15.0)
Total Lymphocyte: 23.1 %
WBC: 7.3 10*3/uL (ref 3.8–10.8)

## 2023-12-12 LAB — QUANTIFERON-TB GOLD PLUS
Mitogen-NIL: 8.67 [IU]/mL
NIL: 0.02 [IU]/mL
QuantiFERON-TB Gold Plus: NEGATIVE
TB1-NIL: 0 [IU]/mL
TB2-NIL: <0 [IU]/mL

## 2023-12-13 NOTE — Progress Notes (Signed)
TB Gold negative

## 2023-12-15 ENCOUNTER — Encounter (HOSPITAL_BASED_OUTPATIENT_CLINIC_OR_DEPARTMENT_OTHER): Payer: Self-pay | Admitting: Pharmacist

## 2023-12-15 ENCOUNTER — Other Ambulatory Visit (HOSPITAL_BASED_OUTPATIENT_CLINIC_OR_DEPARTMENT_OTHER): Payer: Self-pay

## 2023-12-16 ENCOUNTER — Other Ambulatory Visit: Payer: Self-pay

## 2023-12-16 ENCOUNTER — Other Ambulatory Visit (HOSPITAL_BASED_OUTPATIENT_CLINIC_OR_DEPARTMENT_OTHER): Payer: Self-pay

## 2023-12-16 MED ORDER — LORAZEPAM 0.5 MG PO TABS
0.5000 mg | ORAL_TABLET | Freq: Two times a day (BID) | ORAL | 0 refills | Status: DC
  Filled 2023-12-16: qty 30, 15d supply, fill #0

## 2023-12-28 ENCOUNTER — Other Ambulatory Visit (HOSPITAL_BASED_OUTPATIENT_CLINIC_OR_DEPARTMENT_OTHER): Payer: Self-pay

## 2023-12-28 ENCOUNTER — Other Ambulatory Visit: Payer: Self-pay | Admitting: Rheumatology

## 2023-12-28 NOTE — Telephone Encounter (Signed)
 Last Fill: 11/30/2023 (30 day supply)  Labs: 12/09/2023 CBC and CMP are normal.   Next Visit: 01/19/2024  Last Visit: 07/19/2024  DX: Rheumatoid arthritis involving multiple sites with positive rheumatoid factor   Current Dose per office note 07/19/2024:  methotrexate  2.5 mg 8 tablets every 7 days   Okay to refill Methotrexate ?

## 2023-12-28 NOTE — Telephone Encounter (Signed)
 Last Fill: 11/30/2023 # 32   Labs: 12/09/2023  CBC and CMP are normal.   Next Visit: 01/19/2024  Last Visit: 07/20/2023  DX: Rheumatoid arthritis involving multiple sites with positive rheumatoid factor   Current Dose per office note 07/20/2023: methotrexate  8 tablets by mouth once weekly   Okay to refill Methotrexate ?

## 2023-12-29 ENCOUNTER — Other Ambulatory Visit (HOSPITAL_BASED_OUTPATIENT_CLINIC_OR_DEPARTMENT_OTHER): Payer: Self-pay

## 2023-12-29 MED ORDER — METHOTREXATE SODIUM 2.5 MG PO TABS
20.0000 mg | ORAL_TABLET | ORAL | 0 refills | Status: DC
  Filled 2023-12-29: qty 96, 84d supply, fill #0

## 2023-12-31 ENCOUNTER — Other Ambulatory Visit: Payer: Self-pay

## 2023-12-31 ENCOUNTER — Other Ambulatory Visit: Payer: Self-pay | Admitting: Rheumatology

## 2023-12-31 NOTE — Progress Notes (Signed)
 Specialty Pharmacy Refill Coordination Note  [facility] is a 58 y.o. female contacted today regarding refills of specialty medication(s) Adalimumab  (Humira  (2 Pen))   Patient requested Delivery   Delivery date: 01/13/24   Verified address: [address]   Medication will be filled on 06.11.25.   This fill date is pending response to refill request from provider. Patient is aware and if they have not received fill by intended date they must follow up with pharmacy.

## 2023-12-31 NOTE — Telephone Encounter (Signed)
 Last Fill: 09/21/2023  Labs: 12/09/2023 CBC and CMP are normal.   TB Gold: 12/09/2023 TB Gold Negative   Next Visit: 01/19/2024  Last Visit: 07/20/2023  [id number] arthritis involving multiple sites with positive rheumatoid factor   Current Dose per office note 07/20/2023: Humira  40 mg sq injections every 14 days   Okay to refill Humira ?

## 2024-01-03 ENCOUNTER — Other Ambulatory Visit: Payer: Self-pay

## 2024-01-03 ENCOUNTER — Other Ambulatory Visit (HOSPITAL_COMMUNITY): Payer: Self-pay

## 2024-01-03 MED ORDER — HUMIRA (2 PEN) 40 MG/0.8ML ~~LOC~~ AJKT
40.0000 mg | AUTO-INJECTOR | SUBCUTANEOUS | 2 refills | Status: DC
  Filled 2024-01-03: qty 2, 28d supply, fill #0
  Filled 2024-02-10: qty 2, 28d supply, fill #1
  Filled 2024-03-08: qty 2, 28d supply, fill #2

## 2024-01-05 NOTE — Progress Notes (Signed)
 Office Visit Note  Patient: Courtney Leonard             Date of Birth: [birth year]           MRN: [id number]             PCP: Jeanene Milder, FNP Referring: Jeanene Milder, FNP Visit Date: 01/19/2024 Occupation: @GUAROCC @  Subjective:  Medication management  History of Present Illness: Courtney Leonard is a 57 y.o. female with seropositive rheumatoid arthritis and osteoarthritis.  She returns today after her last visit in December 2024.  She states that she has morning stiffness in her hands and her feet.  The symptoms lasted only for an hour and then resolved.  She has not noticed any joint swelling.  She denies any interruption in the treatment since last visit.  She has been on Humira  40 mg subcu every 14 days and methotrexate  8 tablets p.o. weekly along with folic acid  2 mg daily.    Activities of Daily Living:  Patient reports morning stiffness for 1 hour.   Patient Reports nocturnal pain.  Difficulty dressing/grooming: Denies Difficulty climbing stairs: Denies Difficulty getting out of chair: Denies Difficulty using hands for taps, buttons, cutlery, and/or writing: Denies  Review of Systems  Constitutional:  Positive for fatigue.  HENT:  Negative for mouth sores and mouth dryness.   Eyes:  Negative for dryness.  Respiratory:  Negative for shortness of breath.   Cardiovascular:  Negative for chest pain and palpitations.  Gastrointestinal:  Negative for blood in stool, constipation and diarrhea.  Endocrine: Negative for increased urination.  Genitourinary:  Negative for involuntary urination.  Musculoskeletal:  Positive for joint pain, joint pain, myalgias, morning stiffness and myalgias. Negative for gait problem, joint swelling, muscle weakness and muscle tenderness.  Skin:  Positive for sensitivity to sunlight. Negative for color change, rash and hair loss.  Allergic/Immunologic: Negative for susceptible to infections.  Neurological:  Positive for headaches. Negative  for dizziness.  Hematological:  Negative for swollen glands.  Psychiatric/Behavioral:  Positive for depressed mood. Negative for sleep disturbance. The patient is nervous/anxious.     PMFS History:  Patient Active Problem List   Diagnosis Date Noted   Rheumatoid arthritis involving multiple sites with positive rheumatoid factor (HCC) 06/13/2016   High risk medication use 06/13/2016   Primary osteoarthritis of both feet 06/13/2016   Insomnia 06/13/2016   Acquired hypothyroidism 06/13/2016   Migraine without status migrainosus, not intractable 06/13/2016   Former smoker 06/13/2016   Anxiety 01/08/2011    Past Medical History:  Diagnosis Date   Anal fissure    Anxiety    Arthritis    Depression    Hypothyroidism    Insomnia    Migraine    Osteoarthritis    Rheumatoid arthritis (HCC)    Thyroid  disease     Family History  Problem Relation Age of Onset   Diabetes Mother    Hypertension Mother    Atrial fibrillation Father    Prostate cancer Father    Stroke Father    Past Surgical History:  Procedure Laterality Date   ABDOMINAL HYSTERECTOMY     CESAREAN SECTION     CYSTOSCOPY W/ URETERAL STENT PLACEMENT Left 03/07/2021   Procedure: CYSTOSCOPY WITH STENT REPLACEMENT, RETROGRADE;  Surgeon: Marco Severs, MD;  Location: [facility];  Service: Urology;  Laterality: Left;   CYSTOSCOPY WITH URETEROSCOPY AND STENT PLACEMENT Left 03/19/2021   Procedure: CYSTOSCOPY WITH LEFT RETROGRADE PYELOGRAM URETEROSCOPY AND STONE EXTRACTION  AND STENT EXCHANGE;  Surgeon: Samson Croak, MD;  Location: [facility];  Service: Urology;  Laterality: Left;   HEMORRHOID SURGERY     KNEE SURGERY     RT knee   laproscopy     LIPOSUCTION  01/15/2021   & excess skin removal   MOUTH SURGERY     ROBOTIC ASSISTED SALPINGO OOPHERECTOMY Left 09/12/2014   Procedure: ROBOTIC ASSISTED Left SALPINGO OOPHORECTOMY/Right Salpingectomy/Pelvic Washings;  Surgeon: Percy Bracken, MD;  Location: [facility];  Service:  Gynecology;  Laterality: Left;   UTERINE STENT PLACEMENT  03/07/2021   Social History   Social History Narrative   Not on file   Immunization History  Administered Date(s) Administered   Hepatitis B 11/08/2009, 12/11/2009   Influenza,trivalent, recombinat, inj, PF 05/17/2006, 05/03/2013   Influenza-Unspecified 05/21/2017, 05/01/2018   PFIZER Comirnaty(Gray Top)Covid-19 Tri-Sucrose Vaccine 08/24/2019, 09/18/2019, 08/30/2020   PFIZER(Purple Top)SARS-COV-2 Vaccination 08/04/2019, 08/25/2019   Pneumococcal Polysaccharide-23 04/10/2016   Td 06/26/2003   Tdap 03/06/2010, 01/13/2016   Varicella 12/25/2009, 01/24/2010   Zoster Recombinant(Shingrix) 09/24/2018, 03/17/2019     Objective: Vital Signs: BP 107/72 (BP Location: Left Arm, Patient Position: Sitting, Cuff Size: Normal)   Pulse 85   Resp 16   Ht 5' 4 (1.626 m)   Wt 176 lb 3.2 oz (79.9 kg)   BMI 30.24 kg/m    Physical Exam Vitals and nursing note reviewed.  Constitutional:      Appearance: She is well-developed.  HENT:     Head: Normocephalic and atraumatic.   Eyes:     Conjunctiva/sclera: Conjunctivae normal.    Cardiovascular:     Rate and Rhythm: Normal rate and regular rhythm.     Heart sounds: Normal heart sounds.  Pulmonary:     Effort: Pulmonary effort is normal.     Breath sounds: Normal breath sounds.  Abdominal:     General: Bowel sounds are normal.     Palpations: Abdomen is soft.   Musculoskeletal:     Cervical back: Normal range of motion.  Lymphadenopathy:     Cervical: No cervical adenopathy.   Skin:    General: Skin is warm and dry.     Capillary Refill: Capillary refill takes less than 2 seconds.   Neurological:     Mental Status: She is alert and oriented to person, place, and time.   Psychiatric:        Behavior: Behavior normal.      Musculoskeletal Exam: Cervical, thoracic and lumbar spine were in good range of motion.  She had no difficulty reaching her toes.  Shoulders,  elbows, wrist joints, MCPs PIPs and DIPs with good range of motion with no synovitis.  She had bilateral PIP and DIP thickening.  Hip joints and knee joints in good range of motion without any warmth swelling or effusion.  She had mild bunion formation and hammertoes.  CDAI Exam: CDAI Score: -- Patient Global: 30 / 100; Provider Global: 10 / 100 Swollen: --; Tender: -- Joint Exam 01/19/2024   No joint exam has been documented for this visit   There is currently no information documented on the homunculus. Go to the Rheumatology activity and complete the homunculus joint exam.  Investigation: No additional findings.  Imaging: No results found.  Recent Labs: Lab Results  Component Value Date   WBC 7.3 12/09/2023   HGB 13.6 12/09/2023   PLT 262 12/09/2023   NA 139 12/09/2023   K 4.5 12/09/2023   CL 103 12/09/2023   CO2  31 12/09/2023   GLUCOSE 91 12/09/2023   BUN 11 12/09/2023   CREATININE 0.76 12/09/2023   BILITOT 0.3 12/09/2023   ALKPHOS 85 03/05/2021   AST 15 12/09/2023   ALT 13 12/09/2023   PROT 6.7 12/09/2023   ALBUMIN 4.9 03/05/2021   CALCIUM 9.4 12/09/2023   GFRAA 79 12/12/2020   QFTBGOLDPLUS NEGATIVE 12/09/2023    Speciality Comments: No specialty comments available.  Procedures:  No procedures performed Allergies: Clindamycin /lincomycin, Elemental sulfur, and Oxycodone    Assessment / Plan:     Visit Diagnoses: Rheumatoid arthritis involving multiple sites with positive rheumatoid factor (HCC) - +RF, anti-CCP negative: She reports stiffness in the morning.  No synovitis was noted.  She denies any joint swelling.  She has been taking methotrexate  and Humira  on a regular basis without any interruption.  I will check sed rate with her next labs.  High risk medication use - Humira  40 mg sq injections every 14 days, methotrexate  2.5 mg 8 tablets every 7 days, and folic acid  1 mg 2 tablets daily.  Labs obtained on Dec 09, 2023 TB Gold was negative, CBC and CMP were  normal.  She was advised to get labs every 3 months.  TB Gold annually.  Information immunization was placed in the AVS.  She was advised to hold Humira  and methotrexate  if she develops an infection resume after the infection resolves.  Annual skin examination to screen for skin cancer was advised.  Use of sunscreen and sun protection was advised.  Primary osteoarthritis of both hands-she had bilateral PIP and DIP thickening which very contributing to morning stiffness.  No synovitis was noted on the examination.  I will check sed rate with the next labs.  Primary osteoarthritis of both feet-she continues to have discomfort in her feet.  Bilateral first MTP thickening and PIP and DIP thickening was noted.  Primary insomnia-sleep hygiene was discussed.  Active problems listed as follows:  History of hypothyroidism  History of migraine  History of anxiety  Former smoker  Orders: Orders Placed This Encounter  Procedures   Sedimentation rate   No orders of the defined types were placed in this encounter.    Follow-Up Instructions: Return in about 5 months (around 06/20/2024) for Rheumatoid arthritis.   Nicholas Bari, MD  Note - This record has been created using Animal nutritionist.  Chart creation errors have been sought, but may not always  have been located. Such creation errors do not reflect on  the standard of medical care.

## 2024-01-06 ENCOUNTER — Other Ambulatory Visit: Payer: Self-pay

## 2024-01-19 ENCOUNTER — Other Ambulatory Visit: Payer: Self-pay | Admitting: Physician Assistant

## 2024-01-19 ENCOUNTER — Ambulatory Visit: Payer: Commercial Managed Care - PPO | Attending: Rheumatology | Admitting: Rheumatology

## 2024-01-19 ENCOUNTER — Other Ambulatory Visit: Payer: Self-pay

## 2024-01-19 ENCOUNTER — Encounter (HOSPITAL_BASED_OUTPATIENT_CLINIC_OR_DEPARTMENT_OTHER): Payer: Self-pay

## 2024-01-19 ENCOUNTER — Encounter: Payer: Self-pay | Admitting: Rheumatology

## 2024-01-19 ENCOUNTER — Other Ambulatory Visit (HOSPITAL_BASED_OUTPATIENT_CLINIC_OR_DEPARTMENT_OTHER): Payer: Self-pay

## 2024-01-19 VITALS — BP 107/72 | HR 85 | Resp 16 | Ht 64.0 in | Wt 176.2 lb

## 2024-01-19 DIAGNOSIS — M0579 Rheumatoid arthritis with rheumatoid factor of multiple sites without organ or systems involvement: Secondary | ICD-10-CM

## 2024-01-19 DIAGNOSIS — Z87891 Personal history of nicotine dependence: Secondary | ICD-10-CM | POA: Diagnosis not present

## 2024-01-19 DIAGNOSIS — M19072 Primary osteoarthritis, left ankle and foot: Secondary | ICD-10-CM

## 2024-01-19 DIAGNOSIS — M19071 Primary osteoarthritis, right ankle and foot: Secondary | ICD-10-CM

## 2024-01-19 DIAGNOSIS — Z8669 Personal history of other diseases of the nervous system and sense organs: Secondary | ICD-10-CM | POA: Diagnosis not present

## 2024-01-19 DIAGNOSIS — M19041 Primary osteoarthritis, right hand: Secondary | ICD-10-CM | POA: Diagnosis not present

## 2024-01-19 DIAGNOSIS — Z8659 Personal history of other mental and behavioral disorders: Secondary | ICD-10-CM

## 2024-01-19 DIAGNOSIS — F5101 Primary insomnia: Secondary | ICD-10-CM

## 2024-01-19 DIAGNOSIS — M19042 Primary osteoarthritis, left hand: Secondary | ICD-10-CM

## 2024-01-19 DIAGNOSIS — Z79899 Other long term (current) drug therapy: Secondary | ICD-10-CM

## 2024-01-19 DIAGNOSIS — Z8639 Personal history of other endocrine, nutritional and metabolic disease: Secondary | ICD-10-CM

## 2024-01-19 MED ORDER — FOLIC ACID 1 MG PO TABS
2.0000 mg | ORAL_TABLET | Freq: Every day | ORAL | 3 refills | Status: AC
  Filled 2024-01-19: qty 180, 90d supply, fill #0
  Filled 2024-04-28: qty 180, 90d supply, fill #1
  Filled 2024-08-07: qty 180, 90d supply, fill #2

## 2024-01-19 NOTE — Telephone Encounter (Signed)
 Last Fill: 01/11/2023  Next Visit: 06/22/2024  Last Visit: 01/19/2024  Dx:  Rheumatoid arthritis involving multiple sites with positive rheumatoid factor   Current Dose per office note on 01/19/2024: folic acid  1 mg 2 tablets daily.   Okay to refill Folic Acid ?

## 2024-01-19 NOTE — Patient Instructions (Signed)
 Standing Labs We placed an order today for your standing lab work.   Please have your standing labs drawn in August  and every 3 months  Please have your labs drawn 2 weeks prior to your appointment so that the provider can discuss your lab results at your appointment, if possible.  Please note that you may see your imaging and lab results in MyChart before we have reviewed them. We will contact you once all results are reviewed. Please allow our office up to 72 hours to thoroughly review all of the results before contacting the office for clarification of your results.  WALK-IN LAB HOURS  Monday through Thursday from 8:00 am -12:30 pm and 1:00 pm-4:00 pm and Friday from 8:00 am-12:00 pm.  Patients with office visits requiring labs will be seen before walk-in labs.  You may encounter longer than normal wait times. Please allow additional time. Wait times may be shorter on  Monday and Thursday afternoons.  We do not book appointments for walk-in labs. We appreciate your patience and understanding with our staff.   Labs are drawn by [facility]. Please bring your co-pay at the time of your lab draw.  You may receive a bill from [facility] for your lab work.  Please note if you are on Hydroxychloroquine and and an order has been placed for a Hydroxychloroquine level,  you will need to have it drawn 4 hours or more after your last dose.  If you wish to have your labs drawn at another location, please call the office 24 hours in advance so we can fax the orders.  The office is located at [address], [place], [place] [address]   If you have any questions regarding directions or hours of operation,  please call [phone].   As a reminder, please drink plenty of water prior to coming for your lab work. Thanks!   Vaccines You are taking a medication(s) that can suppress your immune system.  The following immunizations are recommended: Flu annually Covid-19  Td/Tdap (tetanus,  diphtheria, pertussis) every 10 years Pneumonia (Prevnar 15 then Pneumovax 23 at least 1 year apart.  Alternatively, can take Prevnar 20 without needing additional dose) Shingrix: 2 doses from 4 weeks to 6 months apart  Please check with your PCP to make sure you are up to date.   If you have signs or symptoms of an infection or start antibiotics: First, call your PCP for workup of your infection. Hold your medication through the infection, until you complete your antibiotics, and until symptoms resolve if you take the following: Injectable medication (Actemra, Benlysta, Cimzia, Cosentyx, Enbrel, Humira, Kevzara, Orencia, Remicade, Simponi, Stelara, Taltz, Tremfya) Methotrexate Leflunomide (Arava) Mycophenolate (Cellcept) Cloria Danger, Olumiant, or Rinvoq  Please get an annual skin examination to screen for skin cancer while you are on Humira.  Please use sunscreen and sun protection.

## 2024-01-25 ENCOUNTER — Other Ambulatory Visit (HOSPITAL_BASED_OUTPATIENT_CLINIC_OR_DEPARTMENT_OTHER): Payer: Self-pay

## 2024-01-27 ENCOUNTER — Other Ambulatory Visit (HOSPITAL_BASED_OUTPATIENT_CLINIC_OR_DEPARTMENT_OTHER): Payer: Self-pay

## 2024-01-27 ENCOUNTER — Other Ambulatory Visit: Payer: Self-pay

## 2024-01-27 DIAGNOSIS — F331 Major depressive disorder, recurrent, moderate: Secondary | ICD-10-CM | POA: Diagnosis not present

## 2024-01-27 DIAGNOSIS — Z79899 Other long term (current) drug therapy: Secondary | ICD-10-CM | POA: Diagnosis not present

## 2024-01-27 DIAGNOSIS — N951 Menopausal and female climacteric states: Secondary | ICD-10-CM | POA: Diagnosis not present

## 2024-01-27 DIAGNOSIS — F419 Anxiety disorder, unspecified: Secondary | ICD-10-CM | POA: Diagnosis not present

## 2024-01-27 DIAGNOSIS — F5109 Other insomnia not due to a substance or known physiological condition: Secondary | ICD-10-CM | POA: Diagnosis not present

## 2024-01-27 MED ORDER — TRAZODONE HCL 50 MG PO TABS
25.0000 mg | ORAL_TABLET | Freq: Every day | ORAL | 1 refills | Status: DC
  Filled 2024-01-27 – 2024-03-02 (×2): qty 90, 90d supply, fill #0
  Filled 2024-06-01 (×2): qty 90, 90d supply, fill #1

## 2024-01-27 MED ORDER — LORAZEPAM 0.5 MG PO TABS
0.5000 mg | ORAL_TABLET | Freq: Two times a day (BID) | ORAL | 1 refills | Status: AC | PRN
  Filled 2024-01-27: qty 30, 15d supply, fill #0

## 2024-01-27 MED ORDER — VENLAFAXINE HCL ER 75 MG PO CP24
225.0000 mg | ORAL_CAPSULE | Freq: Every day | ORAL | 1 refills | Status: DC
  Filled 2024-01-27: qty 270, 90d supply, fill #0
  Filled 2024-04-28: qty 270, 90d supply, fill #1

## 2024-01-28 ENCOUNTER — Other Ambulatory Visit (HOSPITAL_BASED_OUTPATIENT_CLINIC_OR_DEPARTMENT_OTHER): Payer: Self-pay

## 2024-01-31 ENCOUNTER — Other Ambulatory Visit (HOSPITAL_BASED_OUTPATIENT_CLINIC_OR_DEPARTMENT_OTHER): Payer: Self-pay

## 2024-02-01 ENCOUNTER — Other Ambulatory Visit (HOSPITAL_BASED_OUTPATIENT_CLINIC_OR_DEPARTMENT_OTHER): Payer: Self-pay

## 2024-02-02 ENCOUNTER — Other Ambulatory Visit (HOSPITAL_BASED_OUTPATIENT_CLINIC_OR_DEPARTMENT_OTHER): Payer: Self-pay

## 2024-02-03 ENCOUNTER — Other Ambulatory Visit (HOSPITAL_BASED_OUTPATIENT_CLINIC_OR_DEPARTMENT_OTHER): Payer: Self-pay

## 2024-02-07 ENCOUNTER — Other Ambulatory Visit (HOSPITAL_BASED_OUTPATIENT_CLINIC_OR_DEPARTMENT_OTHER): Payer: Self-pay

## 2024-02-08 ENCOUNTER — Other Ambulatory Visit: Payer: Self-pay

## 2024-02-08 ENCOUNTER — Other Ambulatory Visit (HOSPITAL_BASED_OUTPATIENT_CLINIC_OR_DEPARTMENT_OTHER): Payer: Self-pay

## 2024-02-09 ENCOUNTER — Other Ambulatory Visit (HOSPITAL_BASED_OUTPATIENT_CLINIC_OR_DEPARTMENT_OTHER): Payer: Self-pay

## 2024-02-10 ENCOUNTER — Other Ambulatory Visit (HOSPITAL_BASED_OUTPATIENT_CLINIC_OR_DEPARTMENT_OTHER): Payer: Self-pay

## 2024-02-10 ENCOUNTER — Other Ambulatory Visit: Payer: Self-pay

## 2024-02-10 NOTE — Progress Notes (Signed)
 Specialty Pharmacy Refill Coordination Note  Courtney Leonard is a 58 y.o. female contacted today regarding refills of specialty medication(s) Adalimumab  (Humira  (2 Pen))   Patient requested Delivery   Delivery date: 02/15/24   Verified address: [address]  [place]  [address]   Medication will be filled on 02/14/24.

## 2024-02-15 DIAGNOSIS — Z780 Asymptomatic menopausal state: Secondary | ICD-10-CM | POA: Diagnosis not present

## 2024-02-15 DIAGNOSIS — M069 Rheumatoid arthritis, unspecified: Secondary | ICD-10-CM | POA: Diagnosis not present

## 2024-02-15 DIAGNOSIS — Z1231 Encounter for screening mammogram for malignant neoplasm of breast: Secondary | ICD-10-CM | POA: Diagnosis not present

## 2024-02-15 DIAGNOSIS — Z13820 Encounter for screening for osteoporosis: Secondary | ICD-10-CM | POA: Diagnosis not present

## 2024-02-29 ENCOUNTER — Ambulatory Visit
Admission: EM | Admit: 2024-02-29 | Discharge: 2024-02-29 | Disposition: A | Discharge status: Home / Self Care | Attending: Emergency Medicine | Admitting: Emergency Medicine

## 2024-02-29 ENCOUNTER — Ambulatory Visit (INDEPENDENT_AMBULATORY_CARE_PROVIDER_SITE_OTHER)

## 2024-02-29 DIAGNOSIS — S91114A Laceration without foreign body of right lesser toe(s) without damage to nail, initial encounter: Secondary | ICD-10-CM

## 2024-02-29 DIAGNOSIS — S99921A Unspecified injury of right foot, initial encounter: Secondary | ICD-10-CM

## 2024-02-29 MED ORDER — DOXYCYCLINE HYCLATE 100 MG PO TABS: 100.0000 mg | ORAL_TABLET | Freq: Two times a day (BID) | ORAL | 0 refills | Status: AC

## 2024-02-29 NOTE — ED Provider Notes (Signed)
 Courtney Leonard    CSN: [id number] Arrival date & time: 02/29/24  1815    HISTORY   Chief Complaint  Patient presents with   Toe Injury   Laceration   HPI Courtney Leonard is a pleasant, 58 y.o. female who presents to urgent care today. Patient states that 30 minutes prior to arrival, she dropped a mirror on her right foot resulting in a laceration across her right fourth toe along with pain and bruising at the bases of her right 3rd, 4th and 5th toes.  Patient states she cleaned the area with sterile water  and dry the area with gauze and came straight here for further evaluation and possible treatment.  Denies prior injury to her right foot/toes in the past.  Last Tdap was given in 2022.  The history is provided by the patient.  Laceration  Past Medical History:  Diagnosis Date   Anal fissure    Anxiety    Arthritis    Depression    Hypothyroidism    Insomnia    Migraine    Osteoarthritis    Rheumatoid arthritis (HCC)    Thyroid  disease    Patient Active Problem List   Diagnosis Date Noted   Macrocytosis 09/28/2023   History of hysterectomy 07/10/2021   Moderate episode of recurrent major depressive disorder (HCC) 10/11/2020   Chronic migraine w/o aura w/o status migrainosus, not intractable 08/16/2017   Rheumatoid arthritis involving multiple sites with positive rheumatoid factor (HCC) 06/13/2016   High risk medication use 06/13/2016   Primary osteoarthritis of both feet 06/13/2016   Insomnia 06/13/2016   Acquired hypothyroidism 06/13/2016   Migraine without status migrainosus, not intractable 06/13/2016   Former smoker 06/13/2016   Menopausal symptoms 10/14/2015   Anxiety 01/08/2011   Past Surgical History:  Procedure Laterality Date   ABDOMINAL HYSTERECTOMY     CESAREAN SECTION     CYSTOSCOPY W/ URETERAL STENT PLACEMENT Left 03/07/2021   Procedure: CYSTOSCOPY WITH STENT REPLACEMENT, RETROGRADE;  Surgeon: Sherrilee Belvie CROME, MD;  Location: [facility];   Service: Urology;  Laterality: Left;   CYSTOSCOPY WITH URETEROSCOPY AND STENT PLACEMENT Left 03/19/2021   Procedure: CYSTOSCOPY WITH LEFT RETROGRADE PYELOGRAM URETEROSCOPY AND STONE EXTRACTION AND STENT EXCHANGE;  Surgeon: Carolee Sherwood JONETTA DOUGLAS, MD;  Location: [facility];  Service: Urology;  Laterality: Left;   HEMORRHOID SURGERY     KNEE SURGERY     RT knee   laproscopy     LIPOSUCTION  01/15/2021   & excess skin removal   MOUTH SURGERY     ROBOTIC ASSISTED SALPINGO OOPHERECTOMY Left 09/12/2014   Procedure: ROBOTIC ASSISTED Left SALPINGO OOPHORECTOMY/Right Salpingectomy/Pelvic Washings;  Surgeon: Percilla Burly, MD;  Location: [facility];  Service: Gynecology;  Laterality: Left;   UTERINE STENT PLACEMENT  03/07/2021   OB History   No obstetric history on file.    Home Medications    Prior to Admission medications   Medication Sig Start Date End Date Taking? Authorizing Provider  adalimumab  (HUMIRA , 2 PEN,) 40 MG/0.8ML AJKT pen INJECT 40 MG INTO THE SKIN EVERY 14 (FOURTEEN) DAYS. 01/03/24 01/02/25  Cheryl Waddell HERO, PA-C  Atogepant  (QULIPTA ) 60 MG TABS Take 1 tablet (60 mg total) by mouth daily. 11/16/23     bisacodyl (DULCOLAX) 5 MG EC tablet Take 5 mg by mouth at bedtime.    [provider]  diclofenac sodium (VOLTAREN) 1 % GEL Apply 2 g topically daily as needed (pain). 04/26/18   [provider]  estradiol  (ESTRACE )  2 MG tablet Take 1 tablet (2 mg total) by mouth daily. 05/12/23     folic acid  (FOLVITE ) 1 MG tablet Take 2 tablets (2 mg total) by mouth daily. 01/19/24   Dolphus Reiter, MD  ibuprofen  (ADVIL ) 200 MG tablet Take 600 mg by mouth every 6 (six) hours as needed for mild pain.    [provider]  levothyroxine  (SYNTHROID ) 88 MCG tablet Take 1 tablet (88 mcg total) by mouth daily. 07/26/23     LORazepam  (ATIVAN ) 0.5 MG tablet Take 1 tablet (0.5 mg total) by mouth 2 (two) times a day as needed for anxiety. 12/16/23     LORazepam  (ATIVAN ) 0.5 MG tablet Take 1 tablet  (0.5 mg total) by mouth 2 (two) times daily as needed for anxiety. 01/27/24     methotrexate  (RHEUMATREX) 2.5 MG tablet Take 8 tablets (20 mg total) by mouth once a week. Caution: Chemotherapy. Protect from light. 12/29/23   Cheryl Waddell HERO, PA-C  SUMAtriptan  (IMITREX ) 100 MG tablet Take 1 tablet (100 mg total) by mouth once as needed for migraine. May repeat dose once in 2 hours if no relief.  Do not exceed 2 doses in 24 hours. 11/16/23     traZODone  (DESYREL ) 50 MG tablet Take 1 tablet (50 mg total) by mouth at bedtime. 01/27/24     valACYclovir  (VALTREX ) 1000 MG tablet Take 1 tablet by mouth daily. 03/17/22   [provider]  venlafaxine  XR (EFFEXOR -XR) 75 MG 24 hr capsule Take 3 capsules (225 mg total) by mouth daily. 01/27/24       Family History Family History  Problem Relation Age of Onset   Diabetes Mother    Hypertension Mother    Atrial fibrillation Father    Prostate cancer Father    Stroke Father    Social History Social History   Tobacco Use   Smoking status: Former    Current packs/day: 0.00    Average packs/day: 0.1 packs/day for 5.0 years (0.5 ttl pk-yrs)    Types: Cigarettes    Start date: 06/28/1996    Quit date: 06/28/2001    Years since quitting: 22.6    Passive exposure: Past   Smokeless tobacco: Never  Vaping Use   Vaping status: Never Used  Substance Use Topics   Alcohol use: Yes    Comment: occ   Drug use: No   Allergies   Clindamycin /lincomycin, Oxycodone , and Sulfa antibiotics  Review of Systems Review of Systems Pertinent findings revealed after performing a 14 point review of systems has been noted in the history of present illness.  Physical Exam Vital Signs BP 113/77 (BP Location: Right Arm)   Pulse 79   Temp 97.7 F (36.5 C) (Oral)   Resp 18   SpO2 97%   No data found.  Physical Exam Vitals and nursing note reviewed.  Constitutional:      General: She is awake. She is not in acute distress.    Appearance: Normal appearance.  She is well-developed and well-groomed.  Musculoskeletal:       Feet:  Neurological:     Mental Status: She is alert.  Psychiatric:        Behavior: Behavior is cooperative.     Visual Acuity Right Eye Distance:   Left Eye Distance:   Bilateral Distance:    Right Eye Near:   Left Eye Near:    Bilateral Near:     Leonard Couse / Diagnostics / Procedures:     Radiology DG Foot Complete  Right Result Date: 02/29/2024 CLINICAL DATA:  Trauma to the right foot. EXAM: RIGHT FOOT COMPLETE - 3+ VIEW COMPARISON:  Right foot radiograph dated 10/07/2020. FINDINGS: There is no evidence of fracture or dislocation. There is no evidence of arthropathy or other focal bone abnormality. Soft tissues are unremarkable. IMPRESSION: Negative. Electronically Signed   By: Vanetta Chou M.D.   On: 02/29/2024 18:48    Procedures Laceration Repair  Date/Time: 02/29/2024 7:00 PM  Performed by: Joesph Shaver Scales, PA-C Authorized by: Joesph Shaver Scales, PA-C   Consent:    Consent obtained:  Verbal   Consent given by:  Patient   Risks, benefits, and alternatives were discussed: yes     Risks discussed:  Infection, need for additional repair, nerve damage, poor wound healing, poor cosmetic result, pain, tendon damage, vascular damage and retained foreign body   Alternatives discussed:  No treatment, delayed treatment, observation and referral Universal protocol:    Procedure explained and questions answered to patient or proxy's satisfaction: yes     Patient identity confirmed:  Verbally with patient and arm band Anesthesia:    Anesthesia method:  Nerve block   Block needle gauge:  27 G   Block anesthetic:  Lidocaine  2% w/o epi   Block injection procedure:  Anatomic landmarks identified, introduced needle, incremental injection, negative aspiration for blood and anatomic landmarks palpated   Block outcome:  Anesthesia achieved Laceration details:    Location:  Toe   Toe location:  R fourth  toe   Length (cm):  1   Depth (mm):  2 Pre-procedure details:    Preparation:  Patient was prepped and draped in usual sterile fashion Exploration:    Limited defect created (wound extended): no     Imaging outcome: foreign body not noted     Wound exploration: wound explored through full range of motion and entire depth of wound visualized     Wound extent: no fascia violation noted, no foreign bodies/material noted, no muscle damage noted, no nerve damage noted, no tendon damage noted, no underlying fracture noted and no vascular damage noted     Contaminated: no   Treatment:    Area cleansed with:  Povidone-iodine   Amount of cleaning:  Standard   Debridement:  None   Undermining:  None Skin repair:    Repair method:  Sutures   Suture size:  2-0 and 3-0   Suture technique:  Simple interrupted   Number of sutures:  2 Approximation:    Approximation:  Close Repair type:    Repair type:  Simple Post-procedure details:    Dressing:  Antibiotic ointment and sterile dressing   Procedure completion:  Tolerated  (including critical care time) EKG  Pending results:  Labs Reviewed - No data to display  Medications Ordered in Leonard: Medications - No data to display  Leonard Diagnoses / Final Clinical Impressions(s)   I have reviewed the triage vital signs and the nursing notes.  Pertinent labs & imaging results that were available during my care of the patient were reviewed by me and considered in my medical decision making (see chart for details).    Final diagnoses:  Injury of right toe, initial encounter  Laceration of fourth toe of right foot, initial encounter   Home care instructions provided.  Patient advised of x-ray findings.  Patient provided with walking boot for comfort.  Doxycycline  provided for infection prophylaxis.  Patient advised to return in 7 days to have sutures removed.  Return precautions  advised.  Please see discharge instructions below for details of plan of  care as provided to patient. ED Prescriptions     Medication Sig Dispense Auth. Provider   doxycycline  (VIBRA -TABS) 100 MG tablet Take 1 tablet (100 mg total) by mouth 2 (two) times daily for 5 days. 10 tablet Joesph Shaver Scales, PA-C      I have reviewed the PDMP during this encounter.  Pending results:  Labs Reviewed - No data to display    Discharge Instructions      The x-ray of your right foot did not reveal any acute bony injuries to any of your toes.  As we discussed, sometimes the x-ray is taken too soon, fractures will not appear on x-ray.  If you are not experiencing any improvement of your pain over the next few days, or if your pain becomes worse, I recommend that you have a repeat x-ray of your foot.  Please return in 7 days to have the sutures removed from your laceration on your right foot.  I have enclosed information about how to care for your laceration at home.    I have sent a prescription for doxycycline  to your pharmacy for infection prevention, please take 1 tablet twice daily for the next 5 days.  If you have any questions or concerns, please do not hesitate to contact us  by phone or return for acute evaluation.  Thank you for visiting [facility] today.  We appreciate the opportunity to participate in your care.      Disposition Upon Discharge:  Condition: stable for discharge home  Patient presented with an acute illness with associated systemic symptoms and significant discomfort requiring urgent management. In my opinion, this is a condition that a prudent lay person (someone who possesses an average knowledge of health and medicine) may potentially expect to result in complications if not addressed urgently such as respiratory distress, impairment of bodily function or dysfunction of bodily organs.   Routine symptom specific, illness specific and/or disease specific instructions were discussed with the patient and/or caregiver at  length.   As such, the patient has been evaluated and assessed, work-up was performed and treatment was provided in alignment with urgent care protocols and evidence based medicine.  Patient/parent/caregiver has been advised that the patient may require follow up for further testing and treatment if the symptoms continue in spite of treatment, as clinically indicated and appropriate.  Patient/parent/caregiver has been advised to return to the [facility] or PCP if no better; to PCP or the Emergency Department if new signs and symptoms develop, or if the current signs or symptoms continue to change or worsen for further workup, evaluation and treatment as clinically indicated and appropriate  The patient will follow up with their current PCP if and as advised. If the patient does not currently have a PCP we will assist them in obtaining one.   The patient may need specialty follow up if the symptoms continue, in spite of conservative treatment and management, for further workup, evaluation, consultation and treatment as clinically indicated and appropriate.  Patient/parent/caregiver verbalized understanding and agreement of plan as discussed.  All questions were addressed during visit.  Please see discharge instructions below for further details of plan.  This office note has been dictated using Teaching laboratory technician.  Unfortunately, this method of dictation can sometimes lead to typographical or grammatical errors.  I apologize for your inconvenience in advance if this occurs.  Please do not hesitate to reach  out to me if clarification is needed.      Joesph Shaver Scales, [place] 02/29/24 [phone]

## 2024-02-29 NOTE — Discharge Instructions (Addendum)
 The x-ray of your right foot did not reveal any acute bony injuries to any of your toes.  As we discussed, sometimes the x-ray is taken too soon, fractures will not appear on x-ray.  If you are not experiencing any improvement of your pain over the next few days, or if your pain becomes worse, I recommend that you have a repeat x-ray of your foot.  Please return in 7 days to have the sutures removed from your laceration on your right foot.  I have enclosed information about how to care for your laceration at home.    I have sent a prescription for doxycycline  to your pharmacy for infection prevention, please take 1 tablet twice daily for the next 5 days.  If you have any questions or concerns, please do not hesitate to contact us  by phone or return for acute evaluation.  Thank you for visiting [facility] today.  We appreciate the opportunity to participate in your care.

## 2024-02-29 NOTE — ED Triage Notes (Addendum)
 Pt states about 0.5 hrs ago she dropped a mirror on her right foot which caused a laceration and since then she has had pain, bruising, and swelling at the 4th right toe. Pt states she can really feel toes 3,4, and 5.   Interventions: Cleaned with sterile water  and dried with gauze

## 2024-03-02 ENCOUNTER — Other Ambulatory Visit (HOSPITAL_BASED_OUTPATIENT_CLINIC_OR_DEPARTMENT_OTHER): Payer: Self-pay

## 2024-03-08 ENCOUNTER — Other Ambulatory Visit: Payer: Self-pay

## 2024-03-08 NOTE — Progress Notes (Signed)
 Specialty Pharmacy Refill Coordination Note  Courtney Leonard is a 58 y.o. female contacted today regarding refills of specialty medication(s) Adalimumab  (Humira  (2 Pen))   Patient requested Delivery   Delivery date: 03/14/24   Verified address: [address]  [place] [address]   Medication will be filled on 03/13/24.

## 2024-03-13 ENCOUNTER — Other Ambulatory Visit: Payer: Self-pay

## 2024-03-13 ENCOUNTER — Telehealth: Payer: Self-pay

## 2024-03-13 DIAGNOSIS — S90931A Unspecified superficial injury of right great toe, initial encounter: Secondary | ICD-10-CM | POA: Diagnosis not present

## 2024-03-13 NOTE — Telephone Encounter (Signed)
 Pt was called and her identity was verified with three patient identifiers. I explained how the patient had been discharged without the [facility] consent being signed and how this form was her agreeing that she received the orthopedic equipment. 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

## 2024-03-17 ENCOUNTER — Other Ambulatory Visit (HOSPITAL_BASED_OUTPATIENT_CLINIC_OR_DEPARTMENT_OTHER): Payer: Self-pay

## 2024-03-30 ENCOUNTER — Other Ambulatory Visit: Payer: Self-pay | Admitting: Physician Assistant

## 2024-03-30 ENCOUNTER — Other Ambulatory Visit: Payer: Self-pay

## 2024-03-30 MED ORDER — HUMIRA (2 PEN) 40 MG/0.8ML ~~LOC~~ AJKT
40.0000 mg | AUTO-INJECTOR | SUBCUTANEOUS | 0 refills | Status: DC
  Filled 2024-03-30 – 2024-04-20 (×2): qty 2, 28d supply, fill #0

## 2024-03-30 NOTE — Telephone Encounter (Signed)
 Last Fill: 01/03/2024  Labs: 12/09/2023 CBC and CMP are normal.   TB Gold: 12/09/2023 Neg    Next Visit: 06/22/2024  Last Visit: 01/19/2024  DX: Rheumatoid arthritis involving multiple sites with positive rheumatoid factor   Current Dose per office note 01/19/2024: Humira  40 mg sq injections every 14 days   Left message to advise patient to she is due to update labs.   Okay to refill Humira ?

## 2024-03-31 ENCOUNTER — Other Ambulatory Visit: Payer: Self-pay

## 2024-03-31 ENCOUNTER — Other Ambulatory Visit (HOSPITAL_BASED_OUTPATIENT_CLINIC_OR_DEPARTMENT_OTHER): Payer: Self-pay

## 2024-04-06 ENCOUNTER — Other Ambulatory Visit: Payer: Self-pay

## 2024-04-06 ENCOUNTER — Other Ambulatory Visit (HOSPITAL_BASED_OUTPATIENT_CLINIC_OR_DEPARTMENT_OTHER): Payer: Self-pay

## 2024-04-06 DIAGNOSIS — Z79899 Other long term (current) drug therapy: Secondary | ICD-10-CM

## 2024-04-06 DIAGNOSIS — M0579 Rheumatoid arthritis with rheumatoid factor of multiple sites without organ or systems involvement: Secondary | ICD-10-CM | POA: Diagnosis not present

## 2024-04-06 MED ORDER — METHOTREXATE SODIUM 2.5 MG PO TABS
20.0000 mg | ORAL_TABLET | ORAL | 0 refills | Status: DC
  Filled 2024-04-06: qty 96, 84d supply, fill #0

## 2024-04-06 NOTE — Telephone Encounter (Signed)
 Patient requested a refill of methotrexate  to [facility].   Last Fill: 12/29/2023  Labs: 04/06/2024 pending results.  12/09/2023 CBC and CMP are normal.   Next Visit: 06/22/2024  Last Visit: 01/19/2024  DX: Rheumatoid arthritis involving multiple sites with positive rheumatoid factor   Current Dose per office note on 01/19/2024: methotrexate  2.5 mg 8 tablets every 7 days,   Okay to refill Methotrexate ?

## 2024-04-07 ENCOUNTER — Other Ambulatory Visit (HOSPITAL_BASED_OUTPATIENT_CLINIC_OR_DEPARTMENT_OTHER): Payer: Self-pay

## 2024-04-07 ENCOUNTER — Ambulatory Visit: Payer: Self-pay | Admitting: Rheumatology

## 2024-04-07 LAB — COMPREHENSIVE METABOLIC PANEL WITH GFR
AG Ratio: 1.7 (calc) (ref 1.0–2.5)
ALT: 12 U/L (ref 6–29)
AST: 14 U/L (ref 10–35)
Albumin: 4.3 g/dL (ref 3.6–5.1)
Alkaline phosphatase (APISO): 70 U/L (ref 37–153)
BUN: 14 mg/dL (ref 7–25)
CO2: 29 mmol/L (ref 20–32)
Calcium: 9.1 mg/dL (ref 8.6–10.4)
Chloride: 104 mmol/L (ref 98–110)
Creat: 0.81 mg/dL (ref 0.50–1.03)
Globulin: 2.6 g/dL (ref 1.9–3.7)
Glucose, Bld: 92 mg/dL (ref 65–99)
Potassium: 4.4 mmol/L (ref 3.5–5.3)
Sodium: 139 mmol/L (ref 135–146)
Total Bilirubin: 0.3 mg/dL (ref 0.2–1.2)
Total Protein: 6.9 g/dL (ref 6.1–8.1)
eGFR: 85 mL/min/1.73m2 (ref >=60)

## 2024-04-07 LAB — CBC WITH DIFFERENTIAL/PLATELET
Absolute Lymphocytes: 1750 {cells}/uL (ref 850–3900)
Absolute Monocytes: 524 {cells}/uL (ref 200–950)
Basophils Absolute: 40 {cells}/uL (ref 0–200)
Basophils Relative: 0.7 %
Eosinophils Absolute: 57 {cells}/uL (ref 15–500)
Eosinophils Relative: 1 %
HCT: 40.2 % (ref 35.0–45.0)
Hemoglobin: 13.6 g/dL (ref 11.7–15.5)
MCH: 34.1 pg — ABNORMAL HIGH (ref 27.0–33.0)
MCHC: 33.8 g/dL (ref 32.0–36.0)
MCV: 100.8 fL — ABNORMAL HIGH (ref 80.0–100.0)
MPV: 10.3 fL (ref 7.5–12.5)
Monocytes Relative: 9.2 %
Neutro Abs: 3329 {cells}/uL (ref 1500–7800)
Neutrophils Relative %: 58.4 %
Platelets: 253 Thousand/uL (ref 140–400)
RBC: 3.99 Million/uL (ref 3.80–5.10)
RDW: 11.9 % (ref 11.0–15.0)
Total Lymphocyte: 30.7 %
WBC: 5.7 Thousand/uL (ref 3.8–10.8)

## 2024-04-07 LAB — SEDIMENTATION RATE: Sed Rate: 6 mm/h (ref 0–30)

## 2024-04-07 NOTE — Progress Notes (Signed)
 CBC and CMP are normal.  Sed rate is normal

## 2024-04-18 ENCOUNTER — Other Ambulatory Visit (HOSPITAL_COMMUNITY): Payer: Self-pay

## 2024-04-20 ENCOUNTER — Other Ambulatory Visit: Payer: Self-pay

## 2024-04-24 ENCOUNTER — Other Ambulatory Visit: Payer: Self-pay

## 2024-04-24 ENCOUNTER — Other Ambulatory Visit: Payer: Self-pay | Admitting: Pharmacy Technician

## 2024-04-24 NOTE — Progress Notes (Signed)
 Specialty Pharmacy Refill Coordination Note  Courtney Leonard is a 58 y.o. female contacted today regarding refills of specialty medication(s) Adalimumab  (Humira  (2 Pen))   Patient requested Delivery   Delivery date: 05/02/24   Verified address: [address]   [place] [address]   Medication will be filled on 05/01/24. Injection date on 10/3.

## 2024-04-28 ENCOUNTER — Other Ambulatory Visit (HOSPITAL_BASED_OUTPATIENT_CLINIC_OR_DEPARTMENT_OTHER): Payer: Self-pay

## 2024-05-01 ENCOUNTER — Other Ambulatory Visit: Payer: Self-pay

## 2024-05-10 ENCOUNTER — Encounter (HOSPITAL_BASED_OUTPATIENT_CLINIC_OR_DEPARTMENT_OTHER): Payer: Self-pay | Admitting: Emergency Medicine

## 2024-05-10 ENCOUNTER — Emergency Department (HOSPITAL_BASED_OUTPATIENT_CLINIC_OR_DEPARTMENT_OTHER)

## 2024-05-10 ENCOUNTER — Emergency Department (HOSPITAL_BASED_OUTPATIENT_CLINIC_OR_DEPARTMENT_OTHER)
Admission: EM | Admit: 2024-05-10 | Discharge: 2024-05-10 | Disposition: A | Discharge status: Home / Self Care | Attending: Emergency Medicine | Admitting: Emergency Medicine

## 2024-05-10 ENCOUNTER — Other Ambulatory Visit: Payer: Self-pay

## 2024-05-10 DIAGNOSIS — W540XXA Bitten by dog, initial encounter: Secondary | ICD-10-CM | POA: Insufficient documentation

## 2024-05-10 DIAGNOSIS — S51852A Open bite of left forearm, initial encounter: Secondary | ICD-10-CM | POA: Diagnosis not present

## 2024-05-10 DIAGNOSIS — S51832A Puncture wound without foreign body of left forearm, initial encounter: Secondary | ICD-10-CM

## 2024-05-10 MED ORDER — AMOXICILLIN-POT CLAVULANATE 875-125 MG PO TABS
1.0000 | ORAL_TABLET | Freq: Once | ORAL | Status: AC
Start: 2024-05-10 — End: 2024-05-10
  Administered 2024-05-10: 1 via ORAL
  Filled 2024-05-10: qty 1

## 2024-05-10 MED ORDER — AMOXICILLIN-POT CLAVULANATE 875-125 MG PO TABS: 1.0000 | ORAL_TABLET | Freq: Two times a day (BID) | ORAL | 0 refills | Status: AC

## 2024-05-10 NOTE — ED Notes (Signed)
 AVS provided by edp was reviewed with the pt. Pt verbalized understanding with no additional questions at this time. Pt verified pharmacy.

## 2024-05-10 NOTE — Discharge Instructions (Addendum)
 Apply ice to the swollen area.  Ice should be applied for 30 minutes at a time, 4 times a day.  Watch for signs of infection.  If any signs of infection develop, you need to be reevaluated.  Your last tetanus immunization was in 2017.  You are due for your next tetanus immunization in 2027.

## 2024-05-10 NOTE — ED Provider Notes (Signed)
 [facility] Provider Note   CSN: [id number] Arrival date & time: 05/10/24  9983     Patient presents with: Animal Bite   Courtney Leonard is a 58 y.o. female.   The history is provided by the patient.  Animal Bite  She has history of rheumatoid arthritis and comes in because of dog bite to her left forearm.  She states that her dogs were fighting and she tried to break up the fight when one of them bit her on her left forearm.  The dog is up-to-date on its immunizations, and patient states she is up-to-date on tetanus immunizations.  She had significant swelling and was concerned that she could have a broken bone.    Prior to Admission medications   Medication Sig Start Date End Date Taking? Authorizing Provider  adalimumab  (HUMIRA , 2 PEN,) 40 MG/0.8ML AJKT pen INJECT 40 MG INTO THE SKIN EVERY 14 (FOURTEEN) DAYS. 03/30/24 03/30/25  Dolphus Reiter, MD  Atogepant  (QULIPTA ) 60 MG TABS Take 1 tablet (60 mg total) by mouth daily. 11/16/23     bisacodyl (DULCOLAX) 5 MG EC tablet Take 5 mg by mouth at bedtime.    [provider]  diclofenac sodium (VOLTAREN) 1 % GEL Apply 2 g topically daily as needed (pain). 04/26/18   [provider]  estradiol  (ESTRACE ) 2 MG tablet Take 1 tablet (2 mg total) by mouth daily. 05/12/23     folic acid  (FOLVITE ) 1 MG tablet Take 2 tablets (2 mg total) by mouth daily. 01/19/24   Dolphus Reiter, MD  ibuprofen  (ADVIL ) 200 MG tablet Take 600 mg by mouth every 6 (six) hours as needed for mild pain.    [provider]  levothyroxine  (SYNTHROID ) 88 MCG tablet Take 1 tablet (88 mcg total) by mouth daily. 07/26/23     LORazepam  (ATIVAN ) 0.5 MG tablet Take 1 tablet (0.5 mg total) by mouth 2 (two) times daily as needed for anxiety. 01/27/24     methotrexate  (RHEUMATREX) 2.5 MG tablet Take 8 tablets (20 mg total) by mouth once a week. Caution: Chemotherapy. Protect from light. 04/06/24   Dolphus Reiter, MD  SUMAtriptan  (IMITREX ) 100 MG tablet Take 1 tablet (100 mg total) by mouth once as needed for migraine. May repeat dose once in 2 hours if no relief.  Do not exceed 2 doses in 24 hours. 11/16/23     traZODone  (DESYREL ) 50 MG tablet Take 0.5-1 tablets (25-50 mg total) by mouth at bedtime. 01/27/24     venlafaxine  XR (EFFEXOR -XR) 75 MG 24 hr capsule Take 3 capsules (225 mg total) by mouth daily. 01/27/24       Allergies: Clindamycin /lincomycin, Oxycodone , and Sulfa antibiotics    Review of Systems  All other systems reviewed and are negative.   Updated Vital Signs BP (!) 126/90 (BP Location: Right Arm)   Pulse 66   Temp 98 F (36.7 C) (Oral)   Resp 15   Ht 5' 4 (1.626 m)   Wt 78.5 kg   SpO2 100%   BMI 29.70 kg/m   Physical Exam Vitals and nursing note reviewed.   58 year old female, resting comfortably and in no acute distress. Vital signs are normal. Oxygen saturation is 100%, which is normal. Head is normocephalic and atraumatic. PERRLA, EOMI. Lungs are clear without rales, wheezes, or rhonchi. Heart has regular rate and rhythm without murmur. Extremities: There are 3 puncture wounds present on the left forearm with significant ecchymosis and swelling  around one of the puncture wounds.  This is consistent with a dog bite.  Distal neurovascular exam is intact with strong pulses, prompt capillary refill, normal sensation. Skin is warm and dry without rash. Neurologic: Awake and alert, moves all extremities equally.       (all labs ordered are listed, but only abnormal results are displayed) Labs Reviewed - No data to display  EKG: None  Radiology: DG Forearm Left Result Date: 05/10/2024 CLINICAL DATA:  [id number] Dog bite [id number] EXAM: LEFT FOREARM - 2 VIEW COMPARISON:  None Available. FINDINGS: There is no evidence of fracture or other focal bone lesions. Soft tissues are unremarkable. IMPRESSION: Negative. Electronically Signed   By: Morgane  Naveau M.D.   On:  05/10/2024 01:03     Procedures   Medications Ordered in the ED  amoxicillin -clavulanate (AUGMENTIN ) 875-125 MG per tablet 1 tablet (1 tablet Oral Given 05/10/24 0352)                                    Medical Decision Making Amount and/or Complexity of Data Reviewed Radiology: ordered.   Dog bite of left forearm.  I have reviewed her past records, and note tetanus immunization on 01/13/2016.  I have ordered a dose of amoxicillin -clavulanate and I am discharging her with prescription for same and I have advised her to watch for signs of infection.     Final diagnoses:  Dog bite, initial encounter  Puncture wound of left forearm, initial encounter    ED Discharge Orders          Ordered    amoxicillin -clavulanate (AUGMENTIN ) 875-125 MG tablet  Every 12 hours        05/10/24 0350               Raford Lenis, MD 05/10/24 [phone]

## 2024-05-10 NOTE — ED Triage Notes (Addendum)
 Patient reports dog bite on left forearm that occurred tonight. She is the owner of the dog and reports he is up to date on vaccinations. Bruising noted to left forearm.

## 2024-05-22 ENCOUNTER — Other Ambulatory Visit: Payer: Self-pay

## 2024-05-22 ENCOUNTER — Other Ambulatory Visit: Payer: Self-pay | Admitting: Rheumatology

## 2024-05-22 MED ORDER — HUMIRA (2 PEN) 40 MG/0.8ML ~~LOC~~ AJKT
40.0000 mg | AUTO-INJECTOR | SUBCUTANEOUS | 2 refills | Status: DC
  Filled 2024-05-22 – 2024-05-24 (×2): qty 2, 28d supply, fill #0
  Filled 2024-06-26 – 2024-06-28 (×2): qty 2, 28d supply, fill #1
  Filled 2024-07-25: qty 2, 28d supply, fill #2

## 2024-05-22 NOTE — Telephone Encounter (Signed)
 Last Fill: 03/30/2024 (30 day supply)  Labs: 04/06/2024 CBC and CMP are normal. Sed rate is normal.   TB Gold: 12/09/2023 Neg    Next Visit: 06/22/2024  Last Visit: 01/19/2024  IK:Myzlfjunpi arthritis involving multiple sites with positive rheumatoid factor   Current Dose per office note 01/19/2024: Humira  40 mg sq injections every 14 days   Okay to refill Humira ?

## 2024-05-24 ENCOUNTER — Other Ambulatory Visit (HOSPITAL_COMMUNITY): Payer: Self-pay

## 2024-05-24 ENCOUNTER — Other Ambulatory Visit: Payer: Self-pay

## 2024-05-26 ENCOUNTER — Other Ambulatory Visit: Payer: Self-pay

## 2024-05-26 ENCOUNTER — Other Ambulatory Visit (HOSPITAL_COMMUNITY): Payer: Self-pay

## 2024-05-26 NOTE — Progress Notes (Signed)
 Specialty Pharmacy Refill Coordination Note  Spoke with Courtney Leonard is a 58 y.o. female contacted today regarding refills of specialty medication(s) Adalimumab  (Humira  (2 Pen))  Doses on hand: 1 for 10/24  Injection date: 06/09/24   Patient requested: Delivery   Delivery date: 06/06/24   Verified address: [address] [place] [address]  Medication will be filled on 06/05/24.

## 2024-06-01 ENCOUNTER — Other Ambulatory Visit (HOSPITAL_BASED_OUTPATIENT_CLINIC_OR_DEPARTMENT_OTHER): Payer: Self-pay

## 2024-06-01 ENCOUNTER — Other Ambulatory Visit (HOSPITAL_COMMUNITY): Payer: Self-pay

## 2024-06-01 NOTE — Telephone Encounter (Signed)
 Rx rf req for Estradiol  2mg . Last rx sent in Oct 2024 x 31yr. Last annual Oct 2024. No upcoming annual sched at this time. Please advise.

## 2024-06-05 ENCOUNTER — Other Ambulatory Visit: Payer: Self-pay

## 2024-06-06 ENCOUNTER — Other Ambulatory Visit (HOSPITAL_BASED_OUTPATIENT_CLINIC_OR_DEPARTMENT_OTHER): Payer: Self-pay

## 2024-06-06 ENCOUNTER — Other Ambulatory Visit: Payer: Self-pay

## 2024-06-06 MED ORDER — ESTRADIOL 2 MG PO TABS
2.0000 mg | ORAL_TABLET | Freq: Every day | ORAL | 3 refills | Status: AC
  Filled 2024-06-06 – 2024-08-07 (×2): qty 90, 90d supply, fill #0

## 2024-06-08 NOTE — Progress Notes (Unsigned)
 Office Visit Note  Patient: Courtney Leonard             Date of Birth: 12/20/1965           MRN: [id number]             PCP: Katrinka Duwaine LABOR, FNP Referring: Katrinka Duwaine LABOR, FNP Visit Date: 06/22/2024 Occupation: RN  Subjective:  Medication monitoring   History of Present Illness: Courtney Leonard is a 58 y.o. female with history of seropositive rheumatoid arthritis.  Patient remains on Humira  40 mg sq injections every 14 days, methotrexate  2.5 mg 8 tablets every 7 days, and folic acid  1 mg 2 tablets daily. She is tolerating combination therapy without any side effects and has not had any gaps in therapy.  She denies any recent or recurrent infections.  She is up-to-date with annual flu shot and is planning to reach out to her PCP to see if she is eligible for the next pneumonia vaccine. Patient states for the past 4 weeks she has been experiencing pain in both thumbs, left greater than right.  She has tried using a brace on the left side which has provided some relief.  She denies any injury prior to the onset of symptoms.  She denies any joint swelling.  She denies any signs or symptoms of a rheumatoid arthritis flare. Patient states that in July she was carrying a heavy mirror and dropped it on her right foot.  She ended up having a laceration of the right fourth toe requiring 3 sutures.  She denies any delayed healing or complications.        Activities of Daily Living:  Patient reports morning stiffness for 2 hours.   Patient Denies nocturnal pain.  Difficulty dressing/grooming: Denies Difficulty climbing stairs: Denies Difficulty getting out of chair: Denies Difficulty using hands for taps, buttons, cutlery, and/or writing: Reports  Review of Systems  Constitutional:  Negative for fatigue.  HENT:  Negative for mouth sores and mouth dryness.   Eyes:  Negative for dryness.  Respiratory:  Negative for shortness of breath.   Cardiovascular:  Negative for chest pain and  palpitations.  Gastrointestinal:  Negative for blood in stool, constipation and diarrhea.  Endocrine: Negative for increased urination.  Genitourinary:  Positive for involuntary urination.  Musculoskeletal:  Positive for joint pain, joint pain, joint swelling and morning stiffness. Negative for gait problem, myalgias, muscle weakness, muscle tenderness and myalgias.  Skin:  Positive for sensitivity to sunlight. Negative for color change, rash and hair loss.  Allergic/Immunologic: Negative for susceptible to infections.  Neurological:  Positive for headaches. Negative for dizziness.  Hematological:  Negative for swollen glands.  Psychiatric/Behavioral:  Negative for depressed mood and sleep disturbance. The patient is not nervous/anxious.     PMFS History:  Patient Active Problem List   Diagnosis Date Noted   Macrocytosis 09/28/2023   History of hysterectomy 07/10/2021   Moderate episode of recurrent major depressive disorder (HCC) 10/11/2020   Chronic migraine w/o aura w/o status migrainosus, not intractable 08/16/2017   Rheumatoid arthritis involving multiple sites with positive rheumatoid factor (HCC) 06/13/2016   High risk medication use 06/13/2016   Primary osteoarthritis of both feet 06/13/2016   Insomnia 06/13/2016   Acquired hypothyroidism 06/13/2016   Migraine without status migrainosus, not intractable 06/13/2016   Former smoker 06/13/2016   Menopausal symptoms 10/14/2015   Anxiety 01/08/2011    Past Medical History:  Diagnosis Date   Anal fissure    Anxiety  Arthritis    Depression    Hypothyroidism    Insomnia    Migraine    Osteoarthritis    Rheumatoid arthritis (HCC)    Thyroid  disease     Family History  Problem Relation Age of Onset   Diabetes Mother    Hypertension Mother    Atrial fibrillation Father    Prostate cancer Father    Stroke Father    Past Surgical History:  Procedure Laterality Date   ABDOMINAL HYSTERECTOMY     CESAREAN SECTION      CYSTOSCOPY W/ URETERAL STENT PLACEMENT Left 03/07/2021   Procedure: CYSTOSCOPY WITH STENT REPLACEMENT, RETROGRADE;  Surgeon: Sherrilee Belvie CROME, MD;  Location: [facility];  Service: Urology;  Laterality: Left;   CYSTOSCOPY WITH URETEROSCOPY AND STENT PLACEMENT Left 03/19/2021   Procedure: CYSTOSCOPY WITH LEFT RETROGRADE PYELOGRAM URETEROSCOPY AND STONE EXTRACTION AND STENT EXCHANGE;  Surgeon: Carolee Sherwood JONETTA DOUGLAS, MD;  Location: [facility];  Service: Urology;  Laterality: Left;   HEMORRHOID SURGERY     KNEE SURGERY     RT knee   laproscopy     LIPOSUCTION  01/15/2021   & excess skin removal   MOUTH SURGERY     ROBOTIC ASSISTED SALPINGO OOPHERECTOMY Left 09/12/2014   Procedure: ROBOTIC ASSISTED Left SALPINGO OOPHORECTOMY/Right Salpingectomy/Pelvic Washings;  Surgeon: Percilla Burly, MD;  Location: [facility];  Service: Gynecology;  Laterality: Left;   UTERINE STENT PLACEMENT  03/07/2021   Social History   Tobacco Use   Smoking status: Former    Current packs/day: 0.00    Average packs/day: 0.1 packs/day for 5.0 years (0.5 ttl pk-yrs)    Types: Cigarettes    Start date: 06/28/1996    Quit date: 06/28/2001    Years since quitting: 23.0    Passive exposure: Past   Smokeless tobacco: Never  Vaping Use   Vaping status: Never Used  Substance Use Topics   Alcohol use: Yes    Comment: occ   Drug use: No   Social History   Social History Narrative   Not on file     Immunization History  Administered Date(s) Administered   Hepatitis B 11/08/2009, 12/11/2009   Influenza,trivalent, recombinat, inj, PF 05/17/2006, 05/03/2013   Influenza-Unspecified 05/21/2017, 05/01/2018   PFIZER Comirnaty(Gray Top)Covid-19 Tri-Sucrose Vaccine 08/24/2019, 09/18/2019, 08/30/2020   PFIZER(Purple Top)SARS-COV-2 Vaccination 08/04/2019, 08/25/2019   Pneumococcal Polysaccharide-23 04/10/2016   Td 06/26/2003   Tdap 03/06/2010, 01/13/2016   Varicella 12/25/2009, 01/24/2010   Zoster Recombinant(Shingrix)  09/24/2018, 03/17/2019     Objective: Vital Signs: BP 116/78   Pulse 77   Temp 98.1 F (36.7 C)   Resp 14   Ht 5' 4 (1.626 m)   Wt 179 lb (81.2 kg)   BMI 30.73 kg/m    Physical Exam Vitals and nursing note reviewed.  Constitutional:      Appearance: She is well-developed.  HENT:     Head: Normocephalic and atraumatic.  Eyes:     Conjunctiva/sclera: Conjunctivae normal.  Cardiovascular:     Rate and Rhythm: Normal rate and regular rhythm.     Heart sounds: Normal heart sounds.  Pulmonary:     Effort: Pulmonary effort is normal.     Breath sounds: Normal breath sounds.  Abdominal:     General: Bowel sounds are normal.     Palpations: Abdomen is soft.  Musculoskeletal:     Cervical back: Normal range of motion.  Lymphadenopathy:     Cervical: No cervical adenopathy.  Skin:    General:  Skin is warm and dry.     Capillary Refill: Capillary refill takes less than 2 seconds.  Neurological:     Mental Status: She is alert and oriented to person, place, and time.  Psychiatric:        Behavior: Behavior normal.      Musculoskeletal Exam: C-spine, thoracic spine, lumbar spine have good range of motion.  No midline spinal tenderness.  No SI joint tenderness.  Shoulder joints, elbow joints, wrist joints, MCPs, PIPs, DIPs have good range of motion with no synovitis. Tenderness along the radial styloid tendon bilaterally.   Complete fist formation bilaterally.  Hip joints have good range of motion with no groin pain.  Knee joints have good range of motion no warmth or effusion.  Ankle joints have good range of motion no tenderness or joint swelling.  No evidence of Achilles tendinitis or plantar fasciitis.   CDAI Exam: CDAI Score: -- Patient Global: --; Provider Global: -- Swollen: --; Tender: -- Joint Exam 06/22/2024   No joint exam has been documented for this visit   There is currently no information documented on the homunculus. Go to the Rheumatology activity and  complete the homunculus joint exam.  Investigation: No additional findings.  Imaging: No results found.   Recent Labs: Lab Results  Component Value Date   WBC 5.7 04/06/2024   HGB 13.6 04/06/2024   PLT 253 04/06/2024   NA 139 04/06/2024   K 4.4 04/06/2024   CL 104 04/06/2024   CO2 29 04/06/2024   GLUCOSE 92 04/06/2024   BUN 14 04/06/2024   CREATININE 0.81 04/06/2024   BILITOT 0.3 04/06/2024   ALKPHOS 85 03/05/2021   AST 14 04/06/2024   ALT 12 04/06/2024   PROT 6.9 04/06/2024   ALBUMIN 4.9 03/05/2021   CALCIUM 9.1 04/06/2024   GFRAA 79 12/12/2020   QFTBGOLDPLUS NEGATIVE 12/09/2023    Speciality Comments: No specialty comments available.  Procedures:  No procedures performed Allergies: Clindamycin /lincomycin, Oxycodone , and Sulfa antibiotics   Assessment / Plan:     Visit Diagnoses: Rheumatoid arthritis involving multiple sites with positive rheumatoid factor (HCC) - +RF, anti-CCP negative: She has no synovitis on examination today.  She has not had any signs or symptoms of a rheumatoid arthritis flare.  She is clinically been doing well on Humira  40 mg sq injections once every 14 days, methotrexate  8 tablets by mouth once weekly, and folic acid  2 mg daily.  She is tolerating combination therapy without any side effects and has not had any gaps in therapy.  No recent or recurrent infections.  No medication changes will be made at this time.  She was advised to notify us  if she develops any signs or symptoms of a flare.  She will follow-up in the office in 5 months or sooner if needed.  High risk medication use - Humira  40 mg sq injections every 14 days, methotrexate  2.5 mg 8 tablets every 7 days, and folic acid  1 mg 2 tablets daily.  CBC and CMP updated on 04/06/24. Orders for CBC and CMP released today.   TB gold negative 12/09/23  Discussed the importance of holding humira  and methotrexate  if she develops signs or symptoms of an infection and to resume once the infection has  completely cleared.  - Plan: CBC with Differential/Platelet, Comprehensive metabolic panel with GFR  Primary osteoarthritis of both hands: No tenderness or synovitis noted.  Complete fist formation bilaterally.  De Quervain's tenosynovitis, bilateral: Patient presents today with bilateral thumb pain  x 4 weeks.  No injury prior to the onset of symptoms.  On examination she has tenderness along the radial styloid tendon bilaterally.  No synovitis was noted. She has tried using a brace on the left side which has provided mild relief.  Discussed the use of topical agents including applying Voltaren gel topically as needed for pain relief.  Discussed that if her symptoms persist or worsen she can return for an ultrasound-guided cortisone injection.  Primary osteoarthritis of both feet: She has good range of motion of both ankle joints with no tenderness or joint swelling.  No tenderness or synovitis of MTP joints.  Other medical conditions are listed as follows:  Primary insomnia  History of hypothyroidism  History of migraine  History of anxiety  Former smoker    Orders: Orders Placed This Encounter  Procedures   CBC with Differential/Platelet   Comprehensive metabolic panel with GFR   Meds ordered this encounter  Medications   methotrexate  (RHEUMATREX) 2.5 MG tablet    Sig: Take 8 tablets (20 mg total) by mouth once a week. Caution: Chemotherapy. Protect from light.    Dispense:  96 tablet    Refill:  0     Follow-Up Instructions: Return in about 5 months (around 11/20/2024) for Rheumatoid arthritis.   Waddell CHRISTELLA Craze, PA-C  Note - This record has been created using Dragon software.  Chart creation errors have been sought, but may not always  have been located. Such creation errors do not reflect on  the standard of medical care.

## 2024-06-15 ENCOUNTER — Other Ambulatory Visit: Payer: Self-pay

## 2024-06-15 NOTE — Progress Notes (Signed)
 Specialty Pharmacy Ongoing Clinical Assessment Note  Courtney Leonard is a 58 y.o. female who is being followed by the specialty pharmacy service for RxSp Rheumatoid Arthritis   Patient's specialty medication(s) reviewed today: Adalimumab  (Humira  (2 Pen))   Missed doses in the last 4 weeks: 0   Patient/Caregiver did not have any additional questions or concerns.   Therapeutic benefit summary: Patient is achieving benefit   Adverse events/side effects summary: No adverse events/side effects   Patient's therapy is appropriate to: Continue    Goals Addressed             This Visit's Progress    Minimize recurrence of flares   On track    Patient is on track. Patient will maintain adherence         Follow up: 12 months  Tex Conroy M Merlen Gurry Specialty Pharmacist

## 2024-06-16 ENCOUNTER — Other Ambulatory Visit (HOSPITAL_BASED_OUTPATIENT_CLINIC_OR_DEPARTMENT_OTHER): Payer: Self-pay

## 2024-06-22 ENCOUNTER — Other Ambulatory Visit (HOSPITAL_BASED_OUTPATIENT_CLINIC_OR_DEPARTMENT_OTHER): Payer: Self-pay

## 2024-06-22 ENCOUNTER — Encounter: Payer: Self-pay | Admitting: Physician Assistant

## 2024-06-22 ENCOUNTER — Ambulatory Visit: Attending: Physician Assistant | Admitting: Physician Assistant

## 2024-06-22 VITALS — BP 116/78 | HR 77 | Temp 98.1°F | Resp 14 | Ht 64.0 in | Wt 179.0 lb

## 2024-06-22 DIAGNOSIS — Z79899 Other long term (current) drug therapy: Secondary | ICD-10-CM

## 2024-06-22 DIAGNOSIS — M654 Radial styloid tenosynovitis [de Quervain]: Secondary | ICD-10-CM

## 2024-06-22 DIAGNOSIS — Z8669 Personal history of other diseases of the nervous system and sense organs: Secondary | ICD-10-CM | POA: Diagnosis not present

## 2024-06-22 DIAGNOSIS — M19072 Primary osteoarthritis, left ankle and foot: Secondary | ICD-10-CM

## 2024-06-22 DIAGNOSIS — Z8659 Personal history of other mental and behavioral disorders: Secondary | ICD-10-CM | POA: Diagnosis not present

## 2024-06-22 DIAGNOSIS — M0579 Rheumatoid arthritis with rheumatoid factor of multiple sites without organ or systems involvement: Secondary | ICD-10-CM | POA: Diagnosis not present

## 2024-06-22 DIAGNOSIS — Z8639 Personal history of other endocrine, nutritional and metabolic disease: Secondary | ICD-10-CM

## 2024-06-22 DIAGNOSIS — Z87891 Personal history of nicotine dependence: Secondary | ICD-10-CM | POA: Diagnosis not present

## 2024-06-22 DIAGNOSIS — M19041 Primary osteoarthritis, right hand: Secondary | ICD-10-CM

## 2024-06-22 DIAGNOSIS — F5101 Primary insomnia: Secondary | ICD-10-CM | POA: Diagnosis not present

## 2024-06-22 DIAGNOSIS — M19042 Primary osteoarthritis, left hand: Secondary | ICD-10-CM

## 2024-06-22 DIAGNOSIS — M19071 Primary osteoarthritis, right ankle and foot: Secondary | ICD-10-CM

## 2024-06-22 MED ORDER — METHOTREXATE SODIUM 2.5 MG PO TABS
20.0000 mg | ORAL_TABLET | ORAL | 0 refills | Status: AC
  Filled 2024-06-22: qty 96, 84d supply, fill #0

## 2024-06-22 NOTE — Patient Instructions (Signed)
 Irritation and Swelling of the Thumb (De Quervain's Syndrome): What to Know  Tommi Rumps Quervain's syndrome causes irritation and swelling of the tendon on the thumb side of the wrist. Tendons are strong tissues that connect muscle to bone.  The tendons in the hand go through a tunnel called a sheath. A slippery layer of tissue helps the tendons move through the sheath. With de Quervain's syndrome, the sheath swells or thickens. This can cause pressure and pain. De Quervain's syndrome is also called de Quervain's disease or de Quervain's tenosynovitis. What are the causes? The exact cause isn't known. It may be from overuse of the hand and wrist. What increases the risk? You're more likely to get [employer] syndrome if: You use your hands far more than normal. This includes if you do certain movements over and over, such as twisting your hand or using a tight grip. You're pregnant. You're female and middle-aged. You have rheumatoid arthritis. You have diabetes. What are the signs or symptoms? The main symptom is pain on the thumb side of your wrist. The pain may get worse when you grasp something or turn your wrist. You may also have: Pain that goes up your forearm. Swelling of your wrist and hand. Trouble moving your thumb and wrist. A feeling of snapping in the wrist. A cyst. This is a pocket of fluid. How is this diagnosed? You may be diagnosed based on your symptoms, medical history, and an exam. The exam may include a Lourena Simmonds test. This is when your health care provider pulls your thumb and wrist to see if it causes pain. You may also have tests. These may include: An X-ray. An MRI. An ultrasound. How is this treated? You may need to: Avoid doing things that: Cause pain or swelling. Cause you to make the same movements with your hand or thumb over and over. Take medicines. These may include: Medicines to help with pain. Steroids to help with irritation and swelling. Wear a  splint. Have surgery. This may be needed if other treatments don't work. Once the pain and swelling have gone down, you may start: Physical therapy. You may be given exercises to help with movement and strength in your wrist and thumb. Occupational therapy. This includes changing how you move your wrist. Follow these instructions at home: If you have a splint: Wear the splint as told. Take it off only if your provider says you can. Check the skin around it every day. Tell your provider if you see problems. Loosen the splint if your fingers tingle, are numb, or turn cold and blue. Keep the splint clean. If the splint isn't waterproof: Do not let it get wet. Cover it when you take a bath or shower. Use a cover that doesn't let any water in. Managing pain, stiffness, and swelling  Use ice or an ice pack as told. If you have a splint that you can take off, remove it only as told. Place a towel between your skin and the ice. Leave the ice on for 20 minutes, 2-3 times a day. If your skin turns red, take off the ice right away to prevent skin damage. The risk of damage is higher if you can't feel pain, heat, or cold. Move your fingers often to reduce stiffness and swelling. Raise your hand above the level of your heart while you're sitting or lying down. Use pillows as needed. General instructions Take your medicines only as told. Ask if it's OK for you to lift. Ask  when it's safe to drive if you have a splint on your hand. Ask what things are safe for you to do at home. Ask when you can go back to work or school. Where to find more information To learn more: Go to orthoinfo.aaos.org. Click "Search." Type "De Quervain's tenosynovitis" into the search bar. Contact a health care provider if: Your pain doesn't get better with medicine. Your pain gets worse. You get new symptoms. This information is not intended to replace advice given to you by your health care provider. Make sure you  discuss any questions you have with your health care provider. Document Revised: 03/15/2023 Document Reviewed: 03/15/2023 Elsevier Patient Education  2024 [employer].

## 2024-06-23 ENCOUNTER — Ambulatory Visit: Payer: Self-pay | Admitting: Physician Assistant

## 2024-06-23 LAB — CBC WITH DIFFERENTIAL/PLATELET
Absolute Lymphocytes: 1727 {cells}/uL (ref 850–3900)
Absolute Monocytes: 523 {cells}/uL (ref 200–950)
Basophils Absolute: 39 {cells}/uL (ref 0–200)
Basophils Relative: 0.7 %
Eosinophils Absolute: 61 {cells}/uL (ref 15–500)
Eosinophils Relative: 1.1 %
HCT: 37.8 % (ref 35.0–45.0)
Hemoglobin: 12.9 g/dL (ref 11.7–15.5)
MCH: 34.2 pg — ABNORMAL HIGH (ref 27.0–33.0)
MCHC: 34.1 g/dL (ref 32.0–36.0)
MCV: 100.3 fL — ABNORMAL HIGH (ref 80.0–100.0)
MPV: 10.2 fL (ref 7.5–12.5)
Monocytes Relative: 9.5 %
Neutro Abs: 3152 {cells}/uL (ref 1500–7800)
Neutrophils Relative %: 57.3 %
Platelets: 258 Thousand/uL (ref 140–400)
RBC: 3.77 Million/uL — ABNORMAL LOW (ref 3.80–5.10)
RDW: 12.6 % (ref 11.0–15.0)
Total Lymphocyte: 31.4 %
WBC: 5.5 Thousand/uL (ref 3.8–10.8)

## 2024-06-23 LAB — COMPREHENSIVE METABOLIC PANEL WITH GFR
AG Ratio: 1.8 (calc) (ref 1.0–2.5)
ALT: 10 U/L (ref 6–29)
AST: 13 U/L (ref 10–35)
Albumin: 4.1 g/dL (ref 3.6–5.1)
Alkaline phosphatase (APISO): 63 U/L (ref 37–153)
BUN: 13 mg/dL (ref 7–25)
CO2: 28 mmol/L (ref 20–32)
Calcium: 8.9 mg/dL (ref 8.6–10.4)
Chloride: 104 mmol/L (ref 98–110)
Creat: 0.86 mg/dL (ref 0.50–1.03)
Globulin: 2.3 g/dL (ref 1.9–3.7)
Glucose, Bld: 77 mg/dL (ref 65–99)
Potassium: 4.1 mmol/L (ref 3.5–5.3)
Sodium: 138 mmol/L (ref 135–146)
Total Bilirubin: 0.2 mg/dL (ref 0.2–1.2)
Total Protein: 6.4 g/dL (ref 6.1–8.1)
eGFR: 79 mL/min/1.73m2 (ref >=60)

## 2024-06-23 NOTE — Progress Notes (Signed)
 RBC count is borderline low.  MCV and MCH are borderline elevated-stable. Please make sure the patient is taking folic acid  2 mg daily.

## 2024-06-23 NOTE — Progress Notes (Signed)
 CMP WNL

## 2024-06-26 ENCOUNTER — Other Ambulatory Visit: Payer: Self-pay

## 2024-06-26 ENCOUNTER — Other Ambulatory Visit (HOSPITAL_COMMUNITY): Payer: Self-pay

## 2024-06-26 ENCOUNTER — Telehealth: Payer: Self-pay

## 2024-06-26 NOTE — Telephone Encounter (Signed)
 Received notification from [facility] regarding a prior authorization for HUMIRA . Authorization has been APPROVED from 06/26/2024 to 06/25/2025. Approval letter sent to scan center.  Authorization # [id number]

## 2024-06-26 NOTE — Telephone Encounter (Signed)
 Received notification from [facility] pharmacy that patient requires a new authorization for their medication.  Submitted an URGENT Prior Authorization request to [employer] for HUMIRA  via CoverMyMeds. Will update once we receive a response.  Key: [id number]

## 2024-06-28 ENCOUNTER — Other Ambulatory Visit (HOSPITAL_COMMUNITY): Payer: Self-pay

## 2024-06-30 ENCOUNTER — Other Ambulatory Visit (HOSPITAL_COMMUNITY): Payer: Self-pay

## 2024-06-30 ENCOUNTER — Other Ambulatory Visit (HOSPITAL_BASED_OUTPATIENT_CLINIC_OR_DEPARTMENT_OTHER): Payer: Self-pay

## 2024-07-04 ENCOUNTER — Other Ambulatory Visit: Payer: Self-pay

## 2024-07-04 ENCOUNTER — Other Ambulatory Visit (HOSPITAL_COMMUNITY): Payer: Self-pay

## 2024-07-04 NOTE — Progress Notes (Signed)
 Specialty Pharmacy Refill Coordination Note  Courtney Leonard is a 58 y.o. female contacted today regarding refills of specialty medication(s) Adalimumab  (Humira  (2 Pen))   Patient requested Delivery   Delivery date: 07/06/24   Verified address: [address] [place] [address]   Medication will be filled on: 07/05/24

## 2024-07-21 ENCOUNTER — Other Ambulatory Visit (HOSPITAL_BASED_OUTPATIENT_CLINIC_OR_DEPARTMENT_OTHER): Payer: Self-pay

## 2024-07-25 ENCOUNTER — Other Ambulatory Visit: Payer: Self-pay

## 2024-07-31 ENCOUNTER — Other Ambulatory Visit: Payer: Self-pay

## 2024-07-31 ENCOUNTER — Other Ambulatory Visit (HOSPITAL_COMMUNITY): Payer: Self-pay

## 2024-07-31 NOTE — Progress Notes (Signed)
 Specialty Pharmacy Refill Coordination Note  Courtney Leonard is a 58 y.o. female contacted today regarding refills of specialty medication(s) Adalimumab  (Humira  (2 Pen))   Patient requested Delivery   Delivery date: 08/11/24   Verified address: [address] [place] [address]   Medication will be filled on: 08/10/24

## 2024-08-07 ENCOUNTER — Other Ambulatory Visit: Payer: Self-pay

## 2024-08-07 ENCOUNTER — Encounter (HOSPITAL_BASED_OUTPATIENT_CLINIC_OR_DEPARTMENT_OTHER): Payer: Self-pay

## 2024-08-07 ENCOUNTER — Other Ambulatory Visit (HOSPITAL_BASED_OUTPATIENT_CLINIC_OR_DEPARTMENT_OTHER): Payer: Self-pay

## 2024-08-07 MED ORDER — VENLAFAXINE HCL ER 75 MG PO CP24
225.0000 mg | ORAL_CAPSULE | Freq: Every day | ORAL | 0 refills | Status: DC
  Filled 2024-08-07: qty 90, 30d supply, fill #0

## 2024-08-07 MED ORDER — TRAZODONE HCL 50 MG PO TABS
25.0000 mg | ORAL_TABLET | Freq: Every day | ORAL | 0 refills | Status: AC
  Filled 2024-08-07: qty 30, 30d supply, fill #0

## 2024-08-07 MED ORDER — LEVOTHYROXINE SODIUM 88 MCG PO TABS
88.0000 ug | ORAL_TABLET | Freq: Every day | ORAL | 0 refills | Status: AC
  Filled 2024-08-07: qty 30, 30d supply, fill #0

## 2024-08-10 ENCOUNTER — Other Ambulatory Visit: Payer: Self-pay

## 2024-08-10 ENCOUNTER — Other Ambulatory Visit (HOSPITAL_BASED_OUTPATIENT_CLINIC_OR_DEPARTMENT_OTHER): Payer: Self-pay

## 2024-08-10 MED ORDER — TRAZODONE HCL 50 MG PO TABS
75.0000 mg | ORAL_TABLET | Freq: Every evening | ORAL | 3 refills | Status: AC | PRN
  Filled 2024-08-10 – 2024-08-23 (×3): qty 135, 90d supply, fill #0

## 2024-08-10 MED ORDER — VENLAFAXINE HCL ER 75 MG PO CP24
225.0000 mg | ORAL_CAPSULE | Freq: Every day | ORAL | 1 refills | Status: AC
  Filled 2024-08-10 – 2024-08-23 (×2): qty 270, 90d supply, fill #0

## 2024-08-14 ENCOUNTER — Other Ambulatory Visit (HOSPITAL_BASED_OUTPATIENT_CLINIC_OR_DEPARTMENT_OTHER): Payer: Self-pay

## 2024-08-14 MED ORDER — ESTRADIOL 2 MG PO TABS: 2.0000 mg | ORAL_TABLET | Freq: Every day | ORAL | 2 refills | Status: AC

## 2024-08-18 ENCOUNTER — Other Ambulatory Visit (HOSPITAL_BASED_OUTPATIENT_CLINIC_OR_DEPARTMENT_OTHER): Payer: Self-pay

## 2024-08-18 MED ORDER — ESTRADIOL 2 MG PO TABS
2.0000 mg | ORAL_TABLET | Freq: Every day | ORAL | 3 refills | Status: AC
  Filled 2024-08-18: qty 90, 90d supply, fill #0

## 2024-08-23 ENCOUNTER — Other Ambulatory Visit (HOSPITAL_BASED_OUTPATIENT_CLINIC_OR_DEPARTMENT_OTHER): Payer: Self-pay

## 2024-08-23 ENCOUNTER — Other Ambulatory Visit: Payer: Self-pay

## 2024-08-28 ENCOUNTER — Other Ambulatory Visit (HOSPITAL_BASED_OUTPATIENT_CLINIC_OR_DEPARTMENT_OTHER): Payer: Self-pay

## 2024-08-28 ENCOUNTER — Encounter (HOSPITAL_BASED_OUTPATIENT_CLINIC_OR_DEPARTMENT_OTHER): Payer: Self-pay

## 2024-08-31 ENCOUNTER — Other Ambulatory Visit (HOSPITAL_COMMUNITY): Payer: Self-pay

## 2024-08-31 ENCOUNTER — Other Ambulatory Visit: Payer: Self-pay | Admitting: Physician Assistant

## 2024-08-31 ENCOUNTER — Other Ambulatory Visit: Payer: Self-pay

## 2024-08-31 MED ORDER — HUMIRA (2 PEN) 40 MG/0.8ML ~~LOC~~ AJKT
40.0000 mg | AUTO-INJECTOR | SUBCUTANEOUS | 2 refills | Status: AC
  Filled 2024-08-31 – 2024-09-06 (×2): qty 1.6, 28d supply, fill #0

## 2024-08-31 NOTE — Telephone Encounter (Signed)
 Last Fill: 05/22/2024  Labs: 08/24/2024 RBC 3.98 MCV 98.2 MCH 34.6 Anion Gap 5  TB Gold: 12/09/2023 TB Gold negative.   Next Visit: 11/23/2024  Last Visit: 06/22/2024  IK:Myzlfjunpi arthritis involving multiple sites with positive rheumatoid factor (HCC)   Current Dose per office note 06/22/2024: Humira  40 mg sq injections every 14 days   Okay to refill Humira ?

## 2024-09-06 ENCOUNTER — Other Ambulatory Visit: Payer: Self-pay

## 2024-09-06 ENCOUNTER — Other Ambulatory Visit (HOSPITAL_COMMUNITY): Payer: Self-pay

## 2024-09-08 ENCOUNTER — Other Ambulatory Visit: Payer: Self-pay

## 2024-09-08 ENCOUNTER — Other Ambulatory Visit (HOSPITAL_COMMUNITY): Payer: Self-pay

## 2024-09-08 NOTE — Progress Notes (Signed)
 Specialty Pharmacy Refill Coordination Note  Spoke with Courtney Leonard is a 59 y.o. female contacted today regarding refills of specialty medication(s) Adalimumab  (Humira  (2 Pen))  Doses on hand: 1 for 2/13  Next inj: 09/29/24   Patient requested: Delivery   Delivery date: 09/26/24   Verified address: [address]  [name] [address]  Medication will be filled on 09/25/24

## 2024-11-23 ENCOUNTER — Ambulatory Visit: Admitting: Physician Assistant
